# Patient Record
Sex: Female | Born: 1960
Health system: Southern US, Community
[De-identification: ages and names within clinical notes are randomized; demographics above are authoritative.]

## PROBLEM LIST (undated history)

## (undated) DIAGNOSIS — J439 Emphysema, unspecified: Secondary | ICD-10-CM

## (undated) DIAGNOSIS — F319 Bipolar disorder, unspecified: Secondary | ICD-10-CM

## (undated) DIAGNOSIS — J189 Pneumonia, unspecified organism: Secondary | ICD-10-CM

## (undated) HISTORY — PX: APPENDECTOMY: SHX54

## (undated) HISTORY — PX: FINGER AMPUTATION: SHX636

---

## 2011-07-08 ENCOUNTER — Emergency Department (HOSPITAL_COMMUNITY)
Admission: EM | Admit: 2011-07-08 | Discharge: 2011-07-09 | Disposition: A | Discharge status: Home / Self Care | Payer: Self-pay | Attending: Emergency Medicine | Admitting: Emergency Medicine

## 2011-07-08 ENCOUNTER — Encounter (HOSPITAL_COMMUNITY): Payer: Self-pay | Admitting: Emergency Medicine

## 2011-07-08 DIAGNOSIS — R110 Nausea: Secondary | ICD-10-CM | POA: Insufficient documentation

## 2011-07-08 DIAGNOSIS — F1910 Other psychoactive substance abuse, uncomplicated: Secondary | ICD-10-CM | POA: Insufficient documentation

## 2011-07-08 LAB — COMPREHENSIVE METABOLIC PANEL
Albumin: 4.5 g/dL (ref 3.5–5.2)
BUN: 12 mg/dL (ref 6–23)
Chloride: 102 mEq/L (ref 96–112)
Creatinine, Ser: 0.6 mg/dL (ref 0.50–1.10)
Total Bilirubin: 0.3 mg/dL (ref 0.3–1.2)

## 2011-07-08 LAB — ETHANOL: Alcohol, Ethyl (B): <11 mg/dL (ref 0–11)

## 2011-07-08 LAB — CBC
HCT: 43.5 % (ref 36.0–46.0)
MCHC: 35.2 g/dL (ref 30.0–36.0)
MCV: 94.2 fL (ref 78.0–100.0)
RDW: 13.2 % (ref 11.5–15.5)
WBC: 12.6 10*3/uL — ABNORMAL HIGH (ref 4.0–10.5)

## 2011-07-08 LAB — RAPID URINE DRUG SCREEN, HOSP PERFORMED
Amphetamines: NOT DETECTED
Opiates: NOT DETECTED

## 2011-07-08 MED ORDER — FOLIC ACID 1 MG PO TABS
1.0000 mg | ORAL_TABLET | Freq: Every day | ORAL | Status: DC
  Administered 2011-07-09: 1 mg via ORAL
  Filled 2011-07-08: qty 1

## 2011-07-08 MED ORDER — NICOTINE 21 MG/24HR TD PT24
21.0000 mg | MEDICATED_PATCH | Freq: Every day | TRANSDERMAL | Status: DC
  Administered 2011-07-09: 21 mg via TRANSDERMAL
  Filled 2011-07-08: qty 1

## 2011-07-08 MED ORDER — LORAZEPAM 1 MG PO TABS: 0.0000 mg | ORAL_TABLET | Freq: Two times a day (BID) | ORAL | Status: DC

## 2011-07-08 MED ORDER — LORAZEPAM 2 MG/ML IJ SOLN: 1.0000 mg | Freq: Four times a day (QID) | INTRAMUSCULAR | Status: DC | PRN

## 2011-07-08 MED ORDER — ZOLPIDEM TARTRATE 5 MG PO TABS: 5.0000 mg | ORAL_TABLET | Freq: Every evening | ORAL | Status: DC | PRN

## 2011-07-08 MED ORDER — ADULT MULTIVITAMIN W/MINERALS CH
1.0000 | ORAL_TABLET | Freq: Every day | ORAL | Status: DC
  Administered 2011-07-09: 1 via ORAL
  Filled 2011-07-08: qty 1

## 2011-07-08 MED ORDER — IBUPROFEN 200 MG PO TABS
600.0000 mg | ORAL_TABLET | Freq: Three times a day (TID) | ORAL | Status: DC | PRN
  Administered 2011-07-09: 600 mg via ORAL
  Filled 2011-07-08: qty 3

## 2011-07-08 MED ORDER — ONDANSETRON HCL 4 MG PO TABS
4.0000 mg | ORAL_TABLET | Freq: Three times a day (TID) | ORAL | Status: DC | PRN
  Administered 2011-07-09: 4 mg via ORAL
  Filled 2011-07-08: qty 1

## 2011-07-08 MED ORDER — THIAMINE HCL 100 MG/ML IJ SOLN: 100.0000 mg | Freq: Every day | INTRAMUSCULAR | Status: DC

## 2011-07-08 MED ORDER — LORAZEPAM 1 MG PO TABS
0.0000 mg | ORAL_TABLET | Freq: Four times a day (QID) | ORAL | Status: DC
  Administered 2011-07-09: 1 mg via ORAL

## 2011-07-08 MED ORDER — LORAZEPAM 1 MG PO TABS
1.0000 mg | ORAL_TABLET | Freq: Four times a day (QID) | ORAL | Status: DC | PRN
  Filled 2011-07-08: qty 1

## 2011-07-08 MED ORDER — VITAMIN B-1 100 MG PO TABS
100.0000 mg | ORAL_TABLET | Freq: Every day | ORAL | Status: DC
  Administered 2011-07-09: 100 mg via ORAL
  Filled 2011-07-08: qty 1

## 2011-07-08 MED ORDER — ALUM & MAG HYDROXIDE-SIMETH 200-200-20 MG/5ML PO SUSP: 30.0000 mL | ORAL | Status: DC | PRN

## 2011-07-08 MED ORDER — LORAZEPAM 1 MG PO TABS: 1.0000 mg | ORAL_TABLET | Freq: Three times a day (TID) | ORAL | Status: DC | PRN

## 2011-07-08 NOTE — ED Notes (Signed)
Pt alert, nad, c/o detox from "hydrocodone, and some alcohol", onset was today, pt states last use was yesterday, denies SI/HI, resp even unlabored, skin pwd

## 2011-07-08 NOTE — ED Notes (Signed)
ALP bedside to eval 

## 2011-07-09 LAB — URINALYSIS, ROUTINE W REFLEX MICROSCOPIC
Glucose, UA: NEGATIVE mg/dL
Hgb urine dipstick: NEGATIVE
Specific Gravity, Urine: 1.033 — ABNORMAL HIGH (ref 1.005–1.030)
pH: 6 (ref 5.0–8.0)

## 2011-07-09 NOTE — ED Provider Notes (Signed)
She is currently awaiting placement for detoxification from opiates. She is complaining of nausea. Zofran has been ordered.  Dione Booze, MD 07/09/11 [phone]

## 2011-07-09 NOTE — ED Notes (Signed)
Pt. given belongings per nurse's request.

## 2011-07-09 NOTE — Discharge Instructions (Signed)
Go directly to [facility] for detox.

## 2011-07-09 NOTE — ED Provider Notes (Signed)
The patient has been accepted at [facility] and will be discharged.  Dione Booze, MD 07/09/11 1048

## 2011-07-09 NOTE — ED Notes (Signed)
Report called to Jenn RN.

## 2011-07-09 NOTE — ED Provider Notes (Signed)
History     CSN: [id number]  Arrival date & time 07/08/11  2013   First MD Initiated Contact with Patient 07/08/11 2215      Chief Complaint  Patient presents with  . Medical Clearance    Detox from Hydrocodone    (Consider location/radiation/quality/duration/timing/severity/associated sxs/prior treatment) HPI Patient presents with a request for detox from hydrocodone. She states that she recently ran out of hydrocodone and would like detox do to "feeling bad". She describes mild nausea and diffuse body aches. She denies vomiting and states she has been able to drink liquids without difficulty. She denies any recent illnesses including no fevers no cough no nausea vomiting abdominal pain or chest pain. She specifically denies suicidal or homicidal ideations. She denies use of any other substances to me but did according to nursing note states that she occasionally drink alcohol. There are no alleviating or modifying factors. There no associated systemic symptoms.  Symptoms are continuous and worsening  History reviewed. No pertinent past medical history.  Past Surgical History  Procedure Date  . Appendectomy     No family history on file.  History  Substance Use Topics  . Smoking status: Current Everyday Smoker -- 1.0 packs/day    Types: Cigarettes  . Smokeless tobacco: Not on file  . Alcohol Use: Yes    OB History    Grav Para Term Preterm Abortions TAB SAB Ect Mult Living                  Review of Systems ROS reviewed and otherwise negative except for mentioned in HPI  Allergies  Review of patient's allergies indicates no known allergies.  Home Medications   Current Outpatient Rx  Name Route Sig Dispense Refill  . CETIRIZINE HCL 5 MG PO TABS Oral Take 5 mg by mouth daily.    Marland Kitchen DIPHENHYDRAMINE-APAP (SLEEP) 25-500 MG PO TABS Oral Take 2 tablets by mouth at bedtime as needed. For sleep.    Marland Kitchen HYDROCODONE-ACETAMINOPHEN 10-325 MG PO TABS Oral Take 2 tablets by mouth  every 4 (four) hours as needed. Pain.    Marland Kitchen MELATONIN 3 MG PO TABS Oral Take 2 tablets by mouth at bedtime.    . OXYCODONE-ACETAMINOPHEN 5-325 MG PO TABS Oral Take 2 tablets by mouth once. Pain.      BP 122/81  Pulse 104  Temp(Src) 98.4 F (36.9 C) (Oral)  Resp 18  Wt 148 lb (67.132 kg)  SpO2 98%  LMP 05/27/2011 Vitals reviewed Physical Exam Physical Examination: General appearance - alert, well appearing, and in no distress Mental status - alert, oriented to person, place, and time Eyes - pupils equal and reactive, no scleral icterus Mouth - mucous membranes moist, pharynx normal without lesions Chest - clear to auscultation, no wheezes, rales or rhonchi, symmetric air entry Heart - normal rate, regular rhythm, normal S1, S2, no murmurs, rubs, clicks or gallops Musculoskeletal - no joint tenderness, deformity or swelling Extremities - peripheral pulses normal, no pedal edema, no clubbing or cyanosis Skin - normal coloration and turgor, no rashes Psych- flat affect, cooperative, calm  ED Course  Procedures (including critical care time)  Labs Reviewed  CBC - Abnormal; Notable for the following:    WBC 12.6 (*)    Hemoglobin 15.3 (*)    All other components within normal limits  COMPREHENSIVE METABOLIC PANEL - Abnormal; Notable for the following:    Potassium 3.3 (*)    Total Protein 8.4 (*)    All other components  within normal limits  URINE RAPID DRUG SCREEN (HOSP PERFORMED) - Abnormal; Notable for the following:    Tetrahydrocannabinol POSITIVE (*)    All other components within normal limits  URINALYSIS, ROUTINE W REFLEX MICROSCOPIC - Abnormal; Notable for the following:    Color, Urine AMBER (*) BIOCHEMICALS MAY BE AFFECTED BY COLOR   Specific Gravity, Urine 1.033 (*)    Bilirubin Urine SMALL (*)    Ketones, ur >80 (*)    All other components within normal limits  ETHANOL   No results found.   1. Substance abuse       MDM  Patient presents with complaint  of narcotic abuse and requesting detox. She denies any suicidality or homicidality. She has been medically cleared and will be evaluated by the ACT counselor for possible detox.        Ethelda Chick, MD 07/10/11 [phone]

## 2011-07-09 NOTE — BH Assessment (Signed)
Assessment Note   Holly Cortez is an 51 y.o. female. Pt requests detox from opiates and reports daily use of 80-90mg  for past 7 years.  Pt also reports sporadic alcohol use.  Pt reports significant withdrawals, COWS 17.  Pt has no prior substance abuse treatment.  Pt also reports depression but denies SI/HI/AV.  Axis I: opioide dependence Axis II: Deferred Axis III: History reviewed. No pertinent past medical history. Axis IV: economic problems Axis V: 51-60 moderate symptoms  Past Medical History: History reviewed. No pertinent past medical history.  Past Surgical History  Procedure Date  . Appendectomy     Family History: No family history on file.  Social History:  reports that she has been smoking Cigarettes.  She has been smoking about 1 pack per day. She does not have any smokeless tobacco history on file. She reports that she drinks alcohol. She reports that she uses illicit drugs.  Additional Social History:  Alcohol / Drug Use Pain Medications: yes Prescriptions: yes Over the Counter: unk History of alcohol / drug use?: Yes Longest period of sobriety (when/how long): none recent Negative Consequences of Use: Financial Withdrawal Symptoms: Agitation;Diarrhea;Fever / Chills;Nausea / Vomiting;Tremors Substance #1 Name of Substance 1: alcohol, age 42, 1-2 drinks 1-2x per month.  Last drink one week ago. Substance #2 Name of Substance 2: opiates: norco, hydrocodone, lortab 2 - Age of First Use: 38 2 - Amount (size/oz): 80-90mg  2 - Frequency: daily 2 - Duration: 7 years 2 - Last Use / Amount: 2/12 10-20mg  Allergies: No Known Allergies  Home Medications:  Medications Prior to Admission  Medication Dose Route Frequency Provider Last Rate Last Dose  . alum & mag hydroxide-simeth (MAALOX/MYLANTA) 200-200-20 MG/5ML suspension 30 mL  30 mL Oral PRN Ethelda Chick, MD      . folic acid (FOLVITE) tablet 1 mg  1 mg Oral Daily Ethelda Chick, MD      . ibuprofen  (ADVIL,MOTRIN) tablet 600 mg  600 mg Oral Q8H PRN Ethelda Chick, MD      . LORazepam (ATIVAN) tablet 1 mg  1 mg Oral Q6H PRN Ethelda Chick, MD       Or  . LORazepam (ATIVAN) injection 1 mg  1 mg Intravenous Q6H PRN Ethelda Chick, MD      . LORazepam (ATIVAN) tablet 0-4 mg  0-4 mg Oral Q6H Ethelda Chick, MD       Followed by  . LORazepam (ATIVAN) tablet 0-4 mg  0-4 mg Oral Q12H Ethelda Chick, MD      . LORazepam (ATIVAN) tablet 1 mg  1 mg Oral Q8H PRN Ethelda Chick, MD      . mulitivitamin with minerals tablet 1 tablet  1 tablet Oral Daily Ethelda Chick, MD      . nicotine (NICODERM CQ - dosed in mg/24 hours) patch 21 mg  21 mg Transdermal Daily Ethelda Chick, MD      . ondansetron [facility]) tablet 4 mg  4 mg Oral Q8H PRN Ethelda Chick, MD      . thiamine (VITAMIN B-1) tablet 100 mg  100 mg Oral Daily Ethelda Chick, MD       Or  . thiamine (B-1) injection 100 mg  100 mg Intravenous Daily Ethelda Chick, MD      . zolpidem (AMBIEN) tablet 5 mg  5 mg Oral QHS PRN Ethelda Chick, MD       No current outpatient  prescriptions on file as of 07/08/2011.    OB/GYN Status:  Patient's last menstrual period was 05/27/2011.  General Assessment Data Location of Assessment: [facility] ED ACT Assessment: Yes Living Arrangements: Parent;Relatives (sister) Can pt return to current living arrangement?: Yes     Risk to self Suicidal Ideation: No Suicidal Intent: No Is patient at risk for suicide?: No Suicidal Plan?: No Access to Means: No What has been your use of drugs/alcohol within the last 12 months?: current user Previous Attempts/Gestures: No Intentional Self Injurious Behavior: None Family Suicide History: No Recent stressful life event(s): Job Loss;Financial Problems Persecutory voices/beliefs?: No Depression: Yes Depression Symptoms: Despondent;Insomnia;Tearfulness;Isolating;Fatigue;Guilt;Loss of interest in usual pleasures;Feeling worthless/self pity Substance abuse  history and/or treatment for substance abuse?: Yes Suicide prevention information given to non-admitted patients: Not applicable  Risk to Others Homicidal Ideation: No Thoughts of Harm to Others: No Current Homicidal Intent: No Current Homicidal Plan: No Access to Homicidal Means: No History of harm to others?: No Assessment of Violence: None Noted Does patient have access to weapons?: No Criminal Charges Pending?: No Does patient have a court date: No  Psychosis Hallucinations: None noted Delusions: None noted  Mental Status Report Appear/Hygiene: Other (Comment) (casual) Eye Contact: Good Motor Activity: Restlessness Speech: Logical/coherent Level of Consciousness: Alert Mood: Depressed;Anxious Affect: Appropriate to circumstance Anxiety Level: Moderate Thought Processes: Coherent;Relevant Judgement: Unimpaired Orientation: Person;Place;Time;Situation Obsessive Compulsive Thoughts/Behaviors: None  Cognitive Functioning Concentration: Normal Memory: Recent Intact;Remote Intact IQ: Average Insight: Good Impulse Control: Poor Appetite: Poor Weight Loss: 0  Weight Gain: 0  Sleep: Decreased Total Hours of Sleep: 2  Vegetative Symptoms: None  Prior Inpatient Therapy Prior Inpatient Therapy: No  Prior Outpatient Therapy Prior Outpatient Therapy: Yes Prior Therapy Dates: 56s Prior Therapy Facilty/Provider(s): [facility] Reason for Treatment: medication/psych  ADL Screening (condition at time of admission) Patient's cognitive ability adequate to safely complete daily activities?: Yes Independently performs ADLs?: Yes  Home Assistive Devices/Equipment Home Assistive Devices/Equipment: None    Abuse/Neglect Assessment (Assessment to be complete while patient is alone) Physical Abuse: Denies Verbal Abuse: Denies Sexual Abuse: Denies Values / Beliefs Cultural Requests During Hospitalization: None Spiritual Requests During Hospitalization: None     Advance Directives (For Healthcare) Advance Directive: Patient does not have advance directive;Patient would not like information    Additional Information 1:1 In Past 12 Months?: No CIRT Risk: No Elopement Risk: No Does patient have medical clearance?: Yes     Disposition: Pt referred to [facility] for detox.    On Site Evaluation by:   Reviewed with Physician:     Lorri Frederick 07/09/2011 12:33 AM

## 2011-07-09 NOTE — ED Notes (Signed)
Pt is c/o pain in back and arms 7/10.  Feeling very tense.  States she hasn't slept in several days.  Has not drank ETOH in a week.  Last used pain pills yesterday.  All vitals WNL.  NAD.

## 2011-07-09 NOTE — Discharge Planning (Signed)
Patient has been accepted to [facility] per [facility] at [facility].  [facility] will come to pick patient up. EDP and patient's nurse notified.  Ileene Hutchinson , MSW, LCSWA 07/09/2011  10:44 AM [phone]

## 2012-10-14 ENCOUNTER — Ambulatory Visit: Payer: No Typology Code available for payment source | Attending: Family Medicine | Admitting: Family Medicine

## 2012-10-14 ENCOUNTER — Ambulatory Visit (HOSPITAL_COMMUNITY)
Admission: RE | Admit: 2012-10-14 | Discharge: 2012-10-14 | Disposition: A | Discharge status: Home / Self Care | Payer: No Typology Code available for payment source | Source: Ambulatory Visit | Attending: Family Medicine | Admitting: Family Medicine

## 2012-10-14 VITALS — BP 110/73 | HR 77 | Temp 98.2°F | Resp 16 | Wt 141.2 lb

## 2012-10-14 DIAGNOSIS — R609 Edema, unspecified: Secondary | ICD-10-CM

## 2012-10-14 DIAGNOSIS — Z79899 Other long term (current) drug therapy: Secondary | ICD-10-CM

## 2012-10-14 DIAGNOSIS — F319 Bipolar disorder, unspecified: Secondary | ICD-10-CM | POA: Insufficient documentation

## 2012-10-14 DIAGNOSIS — M79609 Pain in unspecified limb: Secondary | ICD-10-CM

## 2012-10-14 DIAGNOSIS — M7989 Other specified soft tissue disorders: Secondary | ICD-10-CM | POA: Insufficient documentation

## 2012-10-14 DIAGNOSIS — R600 Localized edema: Secondary | ICD-10-CM

## 2012-10-14 DIAGNOSIS — F17200 Nicotine dependence, unspecified, uncomplicated: Secondary | ICD-10-CM | POA: Insufficient documentation

## 2012-10-14 DIAGNOSIS — M6740 Ganglion, unspecified site: Secondary | ICD-10-CM | POA: Insufficient documentation

## 2012-10-14 DIAGNOSIS — Z8659 Personal history of other mental and behavioral disorders: Secondary | ICD-10-CM | POA: Insufficient documentation

## 2012-10-14 DIAGNOSIS — M79604 Pain in right leg: Secondary | ICD-10-CM

## 2012-10-14 DIAGNOSIS — R631 Polydipsia: Secondary | ICD-10-CM

## 2012-10-14 LAB — COMPREHENSIVE METABOLIC PANEL
ALT: 10 U/L (ref 0–35)
CO2: 26 mEq/L (ref 19–32)
Calcium: 9.4 mg/dL (ref 8.4–10.5)
Chloride: 107 mEq/L (ref 96–112)
Creat: 0.57 mg/dL (ref 0.50–1.10)
Sodium: 138 mEq/L (ref 135–145)
Total Protein: 7.3 g/dL (ref 6.0–8.3)

## 2012-10-14 NOTE — Patient Instructions (Addendum)
Go to have your ultrasound on the right leg to rule out blood clot.  Manic Depression (Bipolar Disorder) Bipolar disorder is also known as manic depressive illness. It is when the brain does not function properly and causes shifts in a person's moods, energy and ability to function in everyday life. These shifts are different from the normal ups and downs that everyone experiences. Instead the shifts are severe. If this goes untreated, the person's life becomes more and more disorderly. People with this disorder can be treated can lead full and productive lives. This disorder must be managed throughout life.  SYMPTOMS   Bipolar disorder causes dramatic mood swings. These mood swings go in cycles. They cycle from extreme "highs" and irritable to deep "lows" of sadness and hopelessness.  Between the extreme moods, there are usually periods of normal mood.  Along with the mood shifts, the person will have severe changes in energy and behavior. The periods of "highs" and "lows" are called episodes of mania and depression. Signs of mania:  Lots of energy, activity and restlessness.  Extreme "high" or good mood.  Extreme irritability.  Racing thoughts and talking very fast.  Jumping from one idea to another.  Not able to focus, easily distracted.  Little need to sleep.  Grand beliefs in one's abilities and powers.  Spending sprees.  Increased sexual drive. This can result in many sexual partners.  Poor judgment.  Abuse of drugs, particularly cocaine, alcohol, and sleeping medication.  Aggressive or provocative behavior.  A lasting period of behavior that is different from usual.  Denial that anything is wrong. *A manic episode is identified if a "high" mood happens with three or more of the other symptoms lasting most of the day, nearly everyday for a week or longer. If the mood is more irritable in nature, four additional symptoms must be present. Signs of depression:  Lasting  feelings of sadness, anxiety, or empty mood.  Feelings of hopelessness with negative thoughts.  Feelings of guilt, worthlessness, or helplessness.  Loss of interest or pleasure in activities once enjoyed, including sex.  Feelings of fatigue or having less energy.  Trouble focusing, making decisions, remembering.  Feeling restless or irritable.  Sleeping too little or too much.  Change in eating with possible weight gain or loss.  Feeling ongoing pain that is not caused by physical illness or injury.  Thoughts of death or suicide or suicide attempts. *A depressive episode is identified as having five or more of the above symptoms that last most of the day, nearly everyday for two weeks or longer. CAUSES   Research shows that there is no single cause for the disorder. Many factors act together to produce the illness.  This can be passed down from family (hereditary).  Environment may play a part. TREATMENT   Long-term treatment is strongly recommended because bipolar disorder is a repeated illness. This disorder is better controlled if treatment is ongoing than if it is off and on.  A combination of medication and talk therapy is best for managing the disorder over time.  Medication.  Medication can be prescribed by a doctor that is an expert in treating mental disorders (psychiatrists). Medications known as "mood stabilizers" are usually prescribed to help control the illness. Other medications can be added when needed. These medicines usually treat episodes of mania or depression that break through despite the mood stabilizer.  Talk Therapy.  Along with medication, some forms of talk therapy are helpful in providing support, education  and guidance to people with the illness and their families. Studies show that this type of treatment increases mood stability, decreases need for hospitalization and improves how they function society.  Electroconvulsive Therapy (ECT).  In  extreme situations where the above treatments do not work or work too slowly to relieve severe symptoms, ECT may be considered. Document Released: 08/17/2000 Document Revised: 08/03/2011 Document Reviewed: 04/08/2007 [facility] Patient Information 2014 [place], [place].

## 2012-10-14 NOTE — Progress Notes (Signed)
Right lower extremity venous duplex completed.  Right:  No evidence of DVT, superficial thrombosis, or Baker's cyst.  Left:  Negative for DVT in the common femoral vein.  

## 2012-10-14 NOTE — Progress Notes (Signed)
Patient ID: Holly Cortez, female   DOB: 12/26/1960, 52 y.o.   MRN: [id number]  CC: establish  HPI: Pt reports that she would like her blood sugar testede.  Pt reports a cyst on her right wrist that has been getting bigger and causing more pain.  Pt reports that she is having some pain in the bottom of the feet.   No Known Allergies History reviewed. No pertinent past medical history. Current Outpatient Prescriptions on File Prior to Visit  Medication Sig Dispense Refill  . cetirizine (ZYRTEC) 5 MG tablet Take 5 mg by mouth daily.      . diphenhydramine-acetaminophen (TYLENOL PM) 25-500 MG TABS Take 2 tablets by mouth at bedtime as needed. For sleep.      Marland Kitchen HYDROcodone-acetaminophen (NORCO) 10-325 MG per tablet Take 2 tablets by mouth every 4 (four) hours as needed. Pain.      . Melatonin 3 MG TABS Take 2 tablets by mouth at bedtime.      Marland Kitchen oxyCODONE-acetaminophen (PERCOCET) 5-325 MG per tablet Take 2 tablets by mouth once. Pain.       No current facility-administered medications on file prior to visit.   History reviewed. No pertinent family history. History   Social History  . Marital Status: Unknown    Spouse Name: N/A    Number of Children: N/A  . Years of Education: N/A   Occupational History  . Not on file.   Social History Main Topics  . Smoking status: Current Every Day Smoker -- 1.00 packs/day    Types: Cigarettes  . Smokeless tobacco: Not on file  . Alcohol Use: Yes  . Drug Use: Yes  . Sexually Active: Not on file   Other Topics Concern  . Not on file   Social History Narrative  . No narrative on file    Review of Systems  Constitutional: Negative for fever, chills, diaphoresis, activity change, appetite change and fatigue.  HENT: Negative for ear pain, nosebleeds, congestion, facial swelling, rhinorrhea, neck pain, neck stiffness and ear discharge.   Eyes: Negative for pain, discharge, redness, itching and visual disturbance.  Respiratory: Negative for  cough, choking, chest tightness, shortness of breath, wheezing and stridor.   Cardiovascular: Negative for chest pain, palpitations and leg swelling.  Gastrointestinal: Negative for abdominal distention.  Genitourinary: Negative for dysuria, urgency, frequency, hematuria, flank pain, decreased urine volume, difficulty urinating and dyspareunia.  Musculoskeletal: Large ganglion cyst on the right wrist  Neurological: Negative for dizziness, tremors, seizures, syncope, facial asymmetry, speech difficulty, weakness, light-headedness, numbness and headaches.  Hematological: Negative for adenopathy. Does not bruise/bleed easily.  Psychiatric/Behavioral: Negative for hallucinations, behavioral problems, confusion, dysphoric mood, decreased concentration and agitation.    Objective:   Filed Vitals:   10/14/12 1750  BP: 110/73  Pulse: 77  Temp: 98.2 F (36.8 C)  Resp: 16    Physical Exam  Constitutional: Appears well-developed and well-nourished. No distress.  HENT: Normocephalic. External right and left ear normal. Oropharynx is clear and moist.  Eyes: Conjunctivae and EOM are normal. PERRLA, no scleral icterus.  Neck: Normal ROM. Neck supple. No JVD. No tracheal deviation. No thyromegaly.  CVS: RRR, S1/S2 +, no murmurs, no gallops, no carotid bruit.  Pulmonary: Effort and breath sounds normal, no stridor, rhonchi, wheezes, rales.  Abdominal: Soft. BS +,  no distension, tenderness, rebound or guarding.  Musculoskeletal: Normal range of motion. No edema and no tenderness.  Lymphadenopathy: No lymphadenopathy noted, cervical, inguinal. Neuro: Alert. Normal reflexes, muscle tone coordination. No cranial  nerve deficit. Skin: Skin is warm and dry. No rash noted. Not diaphoretic. No erythema. No pallor.  Psychiatric: Normal mood and affect. Behavior, judgment, thought content normal.   Lab Results  Component Value Date   WBC 12.6* 07/08/2011   HGB 15.3* 07/08/2011   HCT 43.5 07/08/2011   MCV  94.2 07/08/2011   PLT 324 07/08/2011   Lab Results  Component Value Date   CREATININE 0.60 07/08/2011   BUN 12 07/08/2011   NA 139 07/08/2011   K 3.3* 07/08/2011   CL 102 07/08/2011   CO2 23 07/08/2011    No results found for this basename: HGBA1C   Lipid Panel  No results found for this basename: chol, trig, hdl, cholhdl, vldl, ldlcalc     Assessment and plan:   Ganglion Cyst right wrist, symptomatic Manic Depression  Chronic pain  Tobacco use - The patient was counseled on the dangers of tobacco use, and was advised to quit and referred to a tobacco cessation program.  Reviewed strategies to maximize success, including removing cigarettes and smoking materials from environment and stress management. Check some labs today to rule out DM per pt request.  Swollen right lower leg and ankle Check doppler ultrasound of right leg to rule out DVT  I also explained to the patient that she needs to see a pain management specialist so that she can be evaluated for chronic pain conditions.  I told her that this clinic was not equipped to handle chronic pain management and so we have decided as a group not to manage chronic pain until we have appropriate staff and support to manage these patients.  The patient was given clear instructions to go to ER or return to medical center if symptoms don't improve, worsen or new problems develop.  The patient verbalized understanding.  The patient was told to call to get lab results if they haven't heard anything in the next week.    Follow up scheduled  Rodney Langton, MD, CDE, FAAFP [facility] [place], [place]

## 2012-10-14 NOTE — Progress Notes (Signed)
Patient states has a history of manic depression Here to establish care

## 2012-10-15 LAB — HEMOGLOBIN A1C: Hgb A1c MFr Bld: 5.1 % (ref <5.7)

## 2012-10-15 NOTE — Progress Notes (Signed)
Quick Note:  Please inform patient that her labs came back normal.   Rodney Langton, MD, CDE, FAAFP [facility] [place], [place]   ______

## 2012-10-24 ENCOUNTER — Telehealth: Payer: Self-pay | Admitting: *Deleted

## 2012-10-24 NOTE — Progress Notes (Signed)
10/24/12 Patient informed that lab are WNLper Dr.Johnson P.Wiliams,RN

## 2012-10-26 ENCOUNTER — Ambulatory Visit: Payer: No Typology Code available for payment source | Attending: Family Medicine | Admitting: Family Medicine

## 2012-10-26 VITALS — BP 119/78 | HR 82 | Temp 97.3°F | Resp 14 | Ht 65.0 in | Wt 143.0 lb

## 2012-10-26 DIAGNOSIS — K029 Dental caries, unspecified: Secondary | ICD-10-CM

## 2012-10-26 MED ORDER — AMOXICILLIN 875 MG PO TABS: 875.0000 mg | ORAL_TABLET | Freq: Two times a day (BID) | ORAL | Status: DC

## 2012-10-26 NOTE — Progress Notes (Signed)
Subjective:     Patient ID: Holly Cortez, female   DOB: 1960-07-23, 52 y.o.   MRN: [id number]  HPI Pt here having just acquired hr orange card, requesting dental referral. She has pain in a few teeth and gum areas on the front area of her lower jaw due to caries. She suspects she will need a root canal.    Review of Systems per hpi     Objective:   Physical Exam  Constitutional: She appears well-developed and well-nourished.  HENT:  Gums inflamed and poor dentition, no obvious abscess.   Cardiovascular: Normal rate and regular rhythm.   Pulmonary/Chest: Effort normal and breath sounds normal.       Assessment:     Pain due to dental caries - Plan: amoxicillin (AMOXIL) 875 MG tablet       Plan:     Showed pt where number is to call for dental services on her orange card.  Amox.  F/u as scheduled.

## 2012-10-26 NOTE — Progress Notes (Signed)
Patient states she needs referral for dentist due to tooth pain that she states is on her left and right sides of upper and lower jaw.

## 2012-10-26 NOTE — Patient Instructions (Addendum)
Dental Caries   Tooth decay (dental caries, cavities) is the most common of all oral diseases. It occurs in all ages but is more common in children and young adults.   CAUSES   Bacteria in your mouth combine with foods (particularly sugars and starches) to produce plaque. Plaque is a substance that sticks to the hard surfaces of teeth. The bacteria in the plaque produce acids that attack the enamel of teeth. Repeated acid attacks dissolve the enamel and create holes in the teeth. Root surfaces of teeth may also get these holes.   Other contributing factors include:    Frequent snacking and drinking of cavity-producing foods and liquids.   Poor oral hygiene.   Dry mouth.   Substance abuse such as methamphetamine.   Broken or poor fitting dental restorations.   Eating disorders.   Gastroesophageal reflux disease (GERD).   Certain radiation treatments to the head and neck.  SYMPTOMS   At first, dental decay appears as white, chalky areas on the enamel. In this early stage, symptoms are seldom present. As the decay progresses, pits and holes may appear on the enamel surfaces. Progression of the decay will lead to softening of the hard layers of the tooth. At this point you may experience some pain or achy feeling after sweet, hot, or cold foods or drinks are consumed. If left untreated, the decay will reach the internal structures of the tooth and produce severe pain. Extensive dental treatment, such as root canal therapy, may be needed to save the tooth at this late stage of decay development.   DIAGNOSIS   Most cavities will be detected during regular check-ups. A thorough medical and dental history will be taken by the dentist. The dentist will use instruments to check the surfaces of your teeth for any breakdown or discoloration. Some dentists have special instruments, such as lasers, that detect tooth decay. Dental X-rays may also show some cavities that are not visible to the eye (such as between the  contact areas of the teeth).  TREATMENT   Treatment involves removal of the tooth decay and replacement with a restorative material such as silver, gold, or composite (white) material. However, if the decay involves a large area of the tooth and there is little remaining healthy tooth structure, a cap (crown) will be fitted over the remaining structure. If the decay involves the center part of the tooth (pulp), root canal treatment will be needed before any type of dental restoration is placed. If the tooth is severely destroyed by the decay process, leaving the remaining tooth structures unrestorable, the tooth will need to be pulled (extracted). Some early tooth decay may be reversed by fluoride treatments and thorough brushing and flossing at home.  PREVENTION    Eat healthy foods. Restrict the amount of sugary, starchy foods and liquids you consume. Avoid frequent snacking and drinking of unhealthy foods and liquids.   Sealants can help with prevention of cavities. Sealants are composite resins applied onto the biting surfaces of teeth at risk for decay. They smooth out the pits and grooves and prevent food from being trapped in them. This is done in early childhood before tooth decay has started.   Fluoride tablets may also be prescribed to children between 6 months and 10 years of age if your drinking water is not fluoridated. The fluoride absorbed by the tooth enamel makes teeth less susceptible to decay. Thorough daily cleaning with a toothbrush and dental floss is the best way to prevent   cavities. Use of a fluoride toothpaste is highly recommended. Fluoride mouth rinses may be used in specific cases.   Topical application of fluoride by your dentist is important in children.   Regular visits with a dentist for checkups and cleanings are also important.  SEEK IMMEDIATE DENTAL CARE IF:   You have a fever.   You develop redness and swelling of your face, jaw, or neck.   You develop swelling around a  tooth.   You are unable to open your mouth or cannot swallow.   You have severe pain uncontrolled by pain medicine.  Document Released: 01/31/2002 Document Revised: 08/03/2011 Document Reviewed: 10/16/2010  [employer] Patient Information 2014 [employer].

## 2012-11-02 ENCOUNTER — Other Ambulatory Visit: Payer: Self-pay | Admitting: Vascular Surgery

## 2012-11-02 DIAGNOSIS — Z79899 Other long term (current) drug therapy: Secondary | ICD-10-CM

## 2012-11-02 DIAGNOSIS — R631 Polydipsia: Secondary | ICD-10-CM

## 2012-11-02 DIAGNOSIS — M79604 Pain in right leg: Secondary | ICD-10-CM

## 2012-11-02 DIAGNOSIS — R600 Localized edema: Secondary | ICD-10-CM

## 2012-11-24 ENCOUNTER — Ambulatory Visit: Payer: No Typology Code available for payment source

## 2012-11-28 ENCOUNTER — Ambulatory Visit: Payer: No Typology Code available for payment source | Attending: Family Medicine | Admitting: Internal Medicine

## 2012-11-28 VITALS — BP 135/78 | HR 84 | Temp 98.3°F | Resp 16 | Wt 141.4 lb

## 2012-11-28 DIAGNOSIS — M25461 Effusion, right knee: Secondary | ICD-10-CM

## 2012-11-28 DIAGNOSIS — F319 Bipolar disorder, unspecified: Secondary | ICD-10-CM

## 2012-11-28 DIAGNOSIS — R21 Rash and other nonspecific skin eruption: Secondary | ICD-10-CM

## 2012-11-28 DIAGNOSIS — M129 Arthropathy, unspecified: Secondary | ICD-10-CM

## 2012-11-28 DIAGNOSIS — M25469 Effusion, unspecified knee: Secondary | ICD-10-CM | POA: Insufficient documentation

## 2012-11-28 DIAGNOSIS — M1990 Unspecified osteoarthritis, unspecified site: Secondary | ICD-10-CM | POA: Insufficient documentation

## 2012-11-28 NOTE — Patient Instructions (Signed)
Arthritis, Nonspecific  Arthritis is pain, redness, warmth, or puffiness (inflammation) of a joint. The joint may be stiff or hurt when you move it. One or more joints may be affected. There are many types of arthritis. Your doctor may not know what type you have right away. The most common cause of arthritis is wear and tear on the joint (osteoarthritis).  HOME CARE   · Only take medicine as told by your doctor.  · Rest the joint as much as possible.  · Raise (elevate) your joint if it is puffy.  · Use crutches if the painful joint is in your leg.  · Drink enough fluids to keep your pee (urine) clear or pale yellow.  · Follow your doctor's diet instructions.  · Use cold packs for very bad joint pain for 10 to 15 minutes every hour. Ask your doctor if it is okay for you to use hot packs.  · Exercise as told by your doctor.  · Take a warm shower if you have stiffness in the morning.  · Move your sore joints throughout the day.  GET HELP RIGHT AWAY IF:   · You have a fever.  · You have very bad joint pain, puffiness, or redness.  · You have many joints that are painful and puffy.  · You are not getting better with treatment.  · You have very bad back pain or leg weakness.  · You cannot control when you poop (bowel movement) or pee (urinate).  · You do not feel better in 24 hours or are getting worse.  · You are having side effects from your medicine.  MAKE SURE YOU:   · Understand these instructions.  · Will watch your condition.  · Will get help right away if you are not doing well or get worse.  Document Released: 08/05/2009 Document Revised: 11/10/2011 Document Reviewed: 08/05/2009  [employer]® Patient Information ©2014 [employer].

## 2012-11-28 NOTE — Progress Notes (Signed)
Patient ID: Holly Cortez, female   DOB: [birth year], 52 y.o.   MRN: [id number]  CC:  HPI: This is a 52 year old female who presents with swelling of her right knee which has been ongoing for about a month now. It had spread down to her calf and she was seen in this clinic for this. An ultrasound/duplex of her right leg was ordered to look for DVT. This was negative. In the interim the patient underwent a course of amoxicillin for teeth issues and the swelling in her leg went down. Hours she continues to have swelling and pain in her right knee and feels like there is bone rubbing on bone. In addition she has similar pains in her hands and shoulders. She states the only thing which alleviates his pain is aspirin which she has been taking 3 times a day. She refuses to take any other medication for this.  In addition she presents with a rash on her hands and on her feet which is on and off for many months. She states the amoxicillin helped this rash as well. But it has recurred. She notes that she has taken multiple over-the-counter medications including hydrocortisone which has not helped the rash resolved. No Known Allergies History reviewed. No pertinent past medical history. Current Outpatient Prescriptions on File Prior to Visit  Medication Sig Dispense Refill  . ClonazePAM (KLONOPIN PO) Take 5 mg by mouth 2 (two) times daily.       . Divalproex Sodium (DEPAKOTE PO) Take by mouth.      . Melatonin 3 MG TABS Take 2 tablets by mouth at bedtime.      Marland Kitchen PARoxetine (PAXIL) 10 MG tablet Take 10 mg by mouth every morning.      . zolpidem (AMBIEN) 10 MG tablet Take 10 mg by mouth at bedtime as needed for sleep.       No current facility-administered medications on file prior to visit.   Family History  Problem Relation Age of Onset  . Heart disease Mother   . Hyperlipidemia Father    History   Social History  . Marital Status: Unknown    Spouse Name: N/A    Number of Children: N/A  . Years  of Education: N/A   Occupational History  . Not on file.   Social History Main Topics  . Smoking status: Current Every Day Smoker -- 1.00 packs/day    Types: Cigarettes  . Smokeless tobacco: Not on file  . Alcohol Use: Yes  . Drug Use: Yes  . Sexually Active: Not on file   Other Topics Concern  . Not on file   Social History Narrative  . No narrative on file    Review of Systems ______ Constitutional: Negative for fever, chills, diaphoresis, activity change, appetite change and fatigue. ____ HENT: Negative for ear pain, nosebleeds, congestion, facial swelling, rhinorrhea, neck pain, neck stiffness and ear discharge.  ____ Eyes: Negative for pain, discharge, redness, itching and visual disturbance. ____ Respiratory: Negative for cough, choking, chest tightness, shortness of breath, wheezing and stridor.  ____ Cardiovascular: Negative for chest pain, palpitations and leg swelling. ____ Gastrointestinal: Negative for Nausea/ Vomiting/ Diarrhea or Consitpation Genitourinary: Negative for dysuria, urgency, frequency, hematuria, flank pain, decreased urine volume, difficulty urinating and dyspareunia. ____ Musculoskeletal: Negative for back pain, joint swelling, arthralgias and gait problem. ________ Neurological: Negative for dizziness, tremors, seizures, syncope, facial asymmetry, speech difficulty, weakness, light-headedness, numbness and headaches. ____ Hematological: Negative for adenopathy. Does not bruise/bleed easily. ____ Psychiatric/Behavioral: Negative  for hallucinations, behavioral problems, confusion, dysphoric mood, decreased concentration and agitation. ______   Objective:   Filed Vitals:   11/28/12 1341  BP: 135/78  Pulse: 84  Temp: 98.3 F (36.8 C)  Resp: 16    Physical Exam ______ Constitutional: Appears well-developed and well-nourished. No distress. ____ HENT: Normocephalic. External right and left ear normal. Oropharynx is clear and moist. ____ Eyes:  Conjunctivae and EOM are normal. PERRLA, no scleral icterus. ____ Neck: Normal ROM. Neck supple. No JVD. No tracheal deviation. No thyromegaly. ____ CVS: RRR, S1/S2 +, no murmurs, no gallops, no carotid bruit.  Pulmonary: Effort and breath sounds normal, no stridor, rhonchi, wheezes, rales.  Abdominal: Soft. BS +,  no distension, tenderness, rebound or guarding. ________ Musculoskeletal: Normal range of motion. No edema and no tenderness. ____ Lymphadenopathy: No lymphadenopathy noted, cervical, inguinal. Neuro: Alert. Normal reflexes, muscle tone coordination. No cranial nerve deficit. Skin: Skin is warm and dry. No rash noted. Not diaphoretic. No erythema. No pallor. ____ Psychiatric: Normal mood and affect. Behavior, judgment, thought content normal. __  Lab Results  Component Value Date   WBC 12.6* 07/08/2011   HGB 15.3* 07/08/2011   HCT 43.5 07/08/2011   MCV 94.2 07/08/2011   PLT 324 07/08/2011   Lab Results  Component Value Date   CREATININE 0.57 10/14/2012   BUN 12 10/14/2012   NA 138 10/14/2012   K 4.7 10/14/2012   CL 107 10/14/2012   CO2 26 10/14/2012    Lab Results  Component Value Date   HGBA1C 5.1 10/14/2012   Lipid Panel  No results found for this basename: chol, trig, hdl, cholhdl, vldl, ldlcalc       Assessment and plan:   Patient Active Problem List   Diagnosis Date Noted  . Leg edema, right 10/14/2012  . Manic depression 10/14/2012   #1. Right knee swelling-most likely arthritis-have ordered an x-ray of her right knee and referred her to orthopedics services at this point she wishes to continue taking aspirin which is the most effective she states for her. #2. Rash on hands: Unable to determine what this is and will refer her to dermatology for this #3 manic depression: Follows up with [facility] for this #4 health maintenance: Mammogram:ordered Pap Smear :will come back in 2-4 wks to have this performed

## 2012-11-28 NOTE — Progress Notes (Signed)
Patient here for follow up of right knee Complains of pain in and around knee Continues to swell from knee all the way to her foot

## 2012-12-19 ENCOUNTER — Ambulatory Visit
Admission: RE | Admit: 2012-12-19 | Discharge: 2012-12-19 | Disposition: A | Discharge status: Home / Self Care | Payer: No Typology Code available for payment source | Source: Ambulatory Visit | Attending: Family Medicine | Admitting: Family Medicine

## 2012-12-19 ENCOUNTER — Ambulatory Visit (INDEPENDENT_AMBULATORY_CARE_PROVIDER_SITE_OTHER): Payer: No Typology Code available for payment source | Admitting: Family Medicine

## 2012-12-19 ENCOUNTER — Other Ambulatory Visit: Payer: Self-pay | Admitting: Family Medicine

## 2012-12-19 VITALS — BP 110/75 | Ht 64.0 in | Wt 138.0 lb

## 2012-12-19 DIAGNOSIS — M25561 Pain in right knee: Secondary | ICD-10-CM

## 2012-12-19 DIAGNOSIS — M25461 Effusion, right knee: Secondary | ICD-10-CM

## 2012-12-19 DIAGNOSIS — R600 Localized edema: Secondary | ICD-10-CM

## 2012-12-19 DIAGNOSIS — M25469 Effusion, unspecified knee: Secondary | ICD-10-CM

## 2012-12-19 DIAGNOSIS — R609 Edema, unspecified: Secondary | ICD-10-CM

## 2012-12-19 DIAGNOSIS — M25569 Pain in unspecified knee: Secondary | ICD-10-CM

## 2012-12-20 ENCOUNTER — Encounter: Payer: Self-pay | Admitting: Family Medicine

## 2012-12-20 DIAGNOSIS — M25561 Pain in right knee: Secondary | ICD-10-CM | POA: Insufficient documentation

## 2012-12-20 NOTE — Progress Notes (Signed)
  Subjective:    Patient ID: Holly Cortez, female    DOB: 02-13-1961, 52 y.o.   MRN: [id number]  HPI  Right knee pain. Reports she has severe osteoarthritis and has had it for years. Has not had any recent x-rays. Right knee pain also for years, worse over the last few weeks. She also reports right lower leg swelling that is intermittent. Says it swells up like a big grapefruit. The left leg does not swell. She recently had lower 70 ultrasound done by her PCP to rule out DVT and it was reportedly negative.  Pain is in the center of the knee, aching, 9/10 at times. Worse with stair climbing. She would like a corticosteroid injection. She's never had a corticosteroid injection in the knee before.  PERTINENT  PMH / PSH: No history of specific right knee injury, no history of knee surgery.  Review of Systems No fever, sweats, chills, unusual weight change.    Objective:   Physical Exam  Vital signs are reviewed GENERAL: Well-developed female no acute distress KNEES: Bilaterally symmetrical. The right knee is without effusion, no erythema, no warmth. Mild crepitus on extension. Extension flexion range of motion is full. Ligamentously intact to varus and valgus stress. Negative McMurray. The calf is soft. Distally neurovascularly intact.      Assessment & Plan:

## 2012-12-26 ENCOUNTER — Ambulatory Visit (INDEPENDENT_AMBULATORY_CARE_PROVIDER_SITE_OTHER): Payer: No Typology Code available for payment source | Admitting: Family Medicine

## 2012-12-26 ENCOUNTER — Encounter: Payer: Self-pay | Admitting: Family Medicine

## 2012-12-26 VITALS — BP 125/77 | HR 86 | Ht 64.0 in | Wt 138.0 lb

## 2012-12-26 DIAGNOSIS — R609 Edema, unspecified: Secondary | ICD-10-CM

## 2012-12-26 DIAGNOSIS — L259 Unspecified contact dermatitis, unspecified cause: Secondary | ICD-10-CM

## 2012-12-26 DIAGNOSIS — M25561 Pain in right knee: Secondary | ICD-10-CM

## 2012-12-26 DIAGNOSIS — R600 Localized edema: Secondary | ICD-10-CM

## 2012-12-26 DIAGNOSIS — M25569 Pain in unspecified knee: Secondary | ICD-10-CM

## 2012-12-26 DIAGNOSIS — L309 Dermatitis, unspecified: Secondary | ICD-10-CM

## 2012-12-26 MED ORDER — METHYLPREDNISOLONE ACETATE 40 MG/ML IJ SUSP
40.0000 mg | Freq: Once | INTRAMUSCULAR | Status: AC
  Administered 2012-12-26: 40 mg via INTRA_ARTICULAR

## 2012-12-26 MED ORDER — TRIAMCINOLONE ACETONIDE 0.025 % EX OINT: TOPICAL_OINTMENT | Freq: Two times a day (BID) | CUTANEOUS | Status: DC

## 2012-12-27 DIAGNOSIS — L309 Dermatitis, unspecified: Secondary | ICD-10-CM | POA: Insufficient documentation

## 2012-12-27 NOTE — Progress Notes (Signed)
  Subjective:    Patient ID: Holly Cortez, female    DOB: 1961-01-17, 52 y.o.   MRN: [id number]  HPI Followup for right knee pain. We had done x-rays which showed only mild osteoarthritic changes. She's here for discussion of options. She has chronic pain 4-8/10. Worse at night. She also complains of lower extremity edema on the right.   Review of Systems As noted no swelling erythema or warmth of the right knee.    Objective:   Physical Exam  Vital signs are reviewed GENERAL: Well-developed female no acute distress did not KNEES: Mildly tender to palpation medial joint line. She has full extension and flexion. She has moderate crepitus on the right. Distal issues nerve actually intact. I see no evidence of any edema. She has intact vascular status. IMAGING: Knees x-rays were ordered standing but I can't non-standing films. Some sclerosis. Mild osteoarthritis.      Assessment & Plan:  #1 knee pain. I would think this is from arthritic change. I suspect given her history that this is a degenerative meniscus issue. We discussed multiple options she chose corticosteroid injection which we did today. #2. Regarding the lower extremity unilateral swelling that she reports is intermittent. I do not think this is related at all to her knee issues specifically not related to arthritis or meniscal issues., Have her followup with her PCP regarding that today we decided to inject her right knee and see how she does #3. Hand eczema. She has an appointment with the dermatologist but it is into a half months. I will increase the potency of the steroid cream she is using 2 triamcinolone ointment. She'll keep the dermatology.

## 2012-12-29 ENCOUNTER — Ambulatory Visit (HOSPITAL_COMMUNITY)
Admission: RE | Admit: 2012-12-29 | Discharge: 2012-12-29 | Disposition: A | Discharge status: Home / Self Care | Payer: No Typology Code available for payment source | Source: Ambulatory Visit | Attending: Internal Medicine | Admitting: Internal Medicine

## 2012-12-29 DIAGNOSIS — M25461 Effusion, right knee: Secondary | ICD-10-CM

## 2012-12-29 DIAGNOSIS — Z1231 Encounter for screening mammogram for malignant neoplasm of breast: Secondary | ICD-10-CM | POA: Insufficient documentation

## 2012-12-29 DIAGNOSIS — M1990 Unspecified osteoarthritis, unspecified site: Secondary | ICD-10-CM

## 2012-12-30 ENCOUNTER — Ambulatory Visit: Payer: No Typology Code available for payment source

## 2012-12-30 ENCOUNTER — Encounter: Payer: Self-pay | Admitting: Internal Medicine

## 2012-12-30 ENCOUNTER — Other Ambulatory Visit: Payer: Self-pay | Admitting: Internal Medicine

## 2012-12-30 ENCOUNTER — Telehealth: Payer: Self-pay

## 2012-12-30 DIAGNOSIS — R928 Other abnormal and inconclusive findings on diagnostic imaging of breast: Secondary | ICD-10-CM

## 2012-12-30 NOTE — Progress Notes (Signed)
Quick Note:  Mammogram reveals possible mass left breast- please ask to return to clinic ASAP ______

## 2012-12-30 NOTE — Telephone Encounter (Signed)
Patient made aware of mamogram results appt given

## 2012-12-30 NOTE — Telephone Encounter (Signed)
Message copied by Lestine Mount on Fri Dec 30, 2012 10:41 AM ------      Message from: Calvert Cantor      Created: Fri Dec 30, 2012 10:32 AM       Mammogram reveals possible mass left breast- please ask to return to clinic ASAP ------

## 2013-01-02 ENCOUNTER — Ambulatory Visit: Payer: No Typology Code available for payment source | Attending: Family Medicine | Admitting: Internal Medicine

## 2013-01-02 ENCOUNTER — Encounter: Payer: Self-pay | Admitting: Internal Medicine

## 2013-01-02 VITALS — BP 112/78 | HR 61 | Temp 97.9°F | Resp 16 | Ht 64.0 in | Wt 135.8 lb

## 2013-01-02 DIAGNOSIS — M1990 Unspecified osteoarthritis, unspecified site: Secondary | ICD-10-CM

## 2013-01-02 DIAGNOSIS — Z09 Encounter for follow-up examination after completed treatment for conditions other than malignant neoplasm: Secondary | ICD-10-CM | POA: Insufficient documentation

## 2013-01-02 DIAGNOSIS — N6320 Unspecified lump in the left breast, unspecified quadrant: Secondary | ICD-10-CM

## 2013-01-02 DIAGNOSIS — N63 Unspecified lump in unspecified breast: Secondary | ICD-10-CM

## 2013-01-02 DIAGNOSIS — M129 Arthropathy, unspecified: Secondary | ICD-10-CM

## 2013-01-02 NOTE — Progress Notes (Signed)
Patient Demographics  Holly Cortez, is a 52 y.o. female  ZOX:[id number]  [id number]:[id number]  DOB - 09-22-1960  Chief Complaint  Patient presents with  . Follow-up    MASS LEFT BREAST        Subjective:   Holly Cortez is here for a follow up visit. She recently underwent a diagnostic mammogram last week which shows a possible left breast mass. Dr. Butler Denmark is already ordered a diagnostic mammogram with ultrasound. Patient is here to discuss results. Currently apart from anxiety over the above-noted results, she does not have any new complaints.  Patient has No headache, No chest pain, No abdominal pain - No Nausea, No new weakness tingling or numbness, No Cough - SOB  Objective:    Filed Vitals:   01/02/13 1645  BP: 112/78  Pulse: 61  Temp: 97.9 F (36.6 C)  TempSrc: Oral  Resp: 16  Height: 5\' 4"  (1.626 m)  Weight: 135 lb 12.8 oz (61.598 kg)  SpO2: 99%     ALLERGIES:  No Known Allergies  PAST MEDICAL HISTORY: No past medical history on file.  MEDICATIONS AT HOME: Prior to Admission medications   Medication Sig Start Date End Date Taking? Authorizing Provider  aspirin 325 MG tablet Take 325 mg by mouth every 4 (four) hours as needed for pain.    Historical Provider, MD  ClonazePAM (KLONOPIN PO) Take 5 mg by mouth 2 (two) times daily.     Historical Provider, MD  Divalproex Sodium (DEPAKOTE PO) Take by mouth.    Historical Provider, MD  Melatonin 3 MG TABS Take 2 tablets by mouth at bedtime.    Historical Provider, MD  PARoxetine (PAXIL) 10 MG tablet Take 10 mg by mouth every morning.    Historical Provider, MD  triamcinolone (KENALOG) 0.025 % ointment Apply topically 2 (two) times daily. 12/26/12   Nestor Ramp, MD  zolpidem (AMBIEN) 10 MG tablet Take 10 mg by mouth at bedtime as needed for sleep.    Historical Provider, MD     Exam  General appearance :Awake, alert, not in any distress. Speech Clear. Not toxic Looking HEENT: Atraumatic and Normocephalic,  pupils equally reactive to light and accomodation Neck: supple, no JVD. No cervical lymphadenopathy.  Chest:Good air entry bilaterally, no added sounds  CVS: S1 S2 regular, no murmurs.  Abdomen: Bowel sounds present, Non tender and not distended with no gaurding, rigidity or rebound. Extremities: B/L Lower Ext shows no edema, both legs are warm to touch Neurology: Awake alert, and oriented X 3, CN II-XII intact, Non focal Skin:No Rash Wounds:N/A    Data Review   CBC No results found for this basename: WBC, HGB, HCT, PLT, MCV, MCH, MCHC, RDW, NEUTRABS, LYMPHSABS, MONOABS, EOSABS, BASOSABS, BANDABS, BANDSABD,  in the last 168 hours  Chemistries   No results found for this basename: NA, K, CL, CO2, GLUCOSE, BUN, CREATININE, GFRCGP, CALCIUM, MG, AST, ALT, ALKPHOS, BILITOT,  in the last 168 hours ------------------------------------------------------------------------------------------------------------------ No results found for this basename: HGBA1C,  in the last 72 hours ------------------------------------------------------------------------------------------------------------------ No results found for this basename: CHOL, HDL, LDLCALC, TRIG, CHOLHDL, LDLDIRECT,  in the last 72 hours ------------------------------------------------------------------------------------------------------------------ No results found for this basename: TSH, T4TOTAL, FREET3, T3FREE, THYROIDAB,  in the last 72 hours ------------------------------------------------------------------------------------------------------------------ No results found for this basename: VITAMINB12, FOLATE, FERRITIN, TIBC, IRON, RETICCTPCT,  in the last 72 hours  Coagulation profile  No results found for this basename: INR, PROTIME,  in the last 168 hours    Assessment &  Plan   Possible left breast mass - Per patient a diagnostic mammogram along with an ultrasound has been scheduled at the breast Center on 8/26-I will have  the patient come back on 8/28 for a followup visit - I spent a considerable amount of time counseling the patient and going over the results of the mammography. I also educated her regarding the next possible steps. We'll await a diagnostic mammogram and ultrasound to be done, depending on those results she may need followup with surgery. For now await further testing before referral to surgery.  Right knee osteoarthritis - Continue to followup at sports medicine  History of bipolar disorder - Stable, continue current medications  The patient was given clear instructions to go to ER or return to medical center if symptoms don't improve, worsen or new problems develop. The patient verbalized understanding. The patient was told to call to get lab results if they haven't heard anything in the next week.  `

## 2013-01-02 NOTE — Progress Notes (Signed)
PT HERE TO F/U WITH LEFT BREAST MASS DIAG LAST WEEK ON MAMMOGRAM. DENIES NIPPLE D/C,OCASS SHARP PAINS. NO REDNESS OR SWELLING NOTED. VSS

## 2013-01-17 ENCOUNTER — Ambulatory Visit
Admission: RE | Admit: 2013-01-17 | Discharge: 2013-01-17 | Disposition: A | Discharge status: Home / Self Care | Payer: No Typology Code available for payment source | Source: Ambulatory Visit | Attending: Internal Medicine | Admitting: Internal Medicine

## 2013-01-17 DIAGNOSIS — R928 Other abnormal and inconclusive findings on diagnostic imaging of breast: Secondary | ICD-10-CM

## 2013-01-20 ENCOUNTER — Ambulatory Visit (INDEPENDENT_AMBULATORY_CARE_PROVIDER_SITE_OTHER): Payer: Self-pay | Admitting: Family Medicine

## 2013-01-20 ENCOUNTER — Encounter: Payer: Self-pay | Admitting: Family Medicine

## 2013-01-20 VITALS — BP 109/74 | HR 96 | Ht 64.0 in | Wt 135.0 lb

## 2013-01-20 DIAGNOSIS — M25561 Pain in right knee: Secondary | ICD-10-CM

## 2013-01-20 DIAGNOSIS — M25569 Pain in unspecified knee: Secondary | ICD-10-CM

## 2013-01-26 NOTE — Progress Notes (Signed)
Patient ID: Holly Cortez, female   DOB: [birth year], 52 y.o.   MRN: [id number] Followup of knee pain. We injected her knee last time she received no benefit. Not even days improvement. Had no bad side effects.  Assessment: Knee pain, x-rays did not show any significant arthritis. I was hasn't been doing injection because it did not think we had a great chance of success. He did not seem to help. Whether she has some type of meniscal issue or other pain issue perhaps associated with her chronic pain syndrome is unknown to me. I don't think we have anything else to offer her. I be happy to see her back but I would not recommend repeat injection. Essentially there is nothing additional sports medicine can offer for her at this time.

## 2014-06-24 ENCOUNTER — Encounter (HOSPITAL_BASED_OUTPATIENT_CLINIC_OR_DEPARTMENT_OTHER): Payer: Self-pay | Admitting: *Deleted

## 2014-06-24 ENCOUNTER — Emergency Department (HOSPITAL_BASED_OUTPATIENT_CLINIC_OR_DEPARTMENT_OTHER)
Admission: EM | Admit: 2014-06-24 | Discharge: 2014-06-24 | Disposition: A | Discharge status: Home / Self Care | Payer: Self-pay | Attending: Emergency Medicine | Admitting: Emergency Medicine

## 2014-06-24 DIAGNOSIS — M2550 Pain in unspecified joint: Secondary | ICD-10-CM | POA: Insufficient documentation

## 2014-06-24 DIAGNOSIS — Z7952 Long term (current) use of systemic steroids: Secondary | ICD-10-CM | POA: Insufficient documentation

## 2014-06-24 DIAGNOSIS — Z720 Tobacco use: Secondary | ICD-10-CM | POA: Insufficient documentation

## 2014-06-24 DIAGNOSIS — Z79899 Other long term (current) drug therapy: Secondary | ICD-10-CM | POA: Insufficient documentation

## 2014-06-24 DIAGNOSIS — Z7982 Long term (current) use of aspirin: Secondary | ICD-10-CM | POA: Insufficient documentation

## 2014-06-24 LAB — COMPREHENSIVE METABOLIC PANEL WITH GFR
ALT: 8 U/L (ref 0–35)
AST: 14 U/L (ref 0–37)
Albumin: 3.5 g/dL (ref 3.5–5.2)
Alkaline Phosphatase: 65 U/L (ref 39–117)
Anion gap: <3 — ABNORMAL LOW (ref 5–15)
BUN: 13 mg/dL (ref 6–23)
CO2: 24 mmol/L (ref 19–32)
Calcium: 8.5 mg/dL (ref 8.4–10.5)
Chloride: 113 mmol/L — ABNORMAL HIGH (ref 96–112)
Creatinine, Ser: 0.66 mg/dL (ref 0.50–1.10)
GFR calc Af Amer: >90 mL/min
GFR calc non Af Amer: >90 mL/min
Glucose, Bld: 87 mg/dL (ref 70–99)
Potassium: 3.8 mmol/L (ref 3.5–5.1)
Sodium: 139 mmol/L (ref 135–145)
Total Bilirubin: 0.2 mg/dL — ABNORMAL LOW (ref 0.3–1.2)
Total Protein: 6.4 g/dL (ref 6.0–8.3)

## 2014-06-24 LAB — CBC
HCT: 38.6 % (ref 36.0–46.0)
Hemoglobin: 12.4 g/dL (ref 12.0–15.0)
MCH: 32 pg (ref 26.0–34.0)
MCHC: 32.1 g/dL (ref 30.0–36.0)
MCV: 99.5 fL (ref 78.0–100.0)
Platelets: 256 10*3/uL (ref 150–400)
RBC: 3.88 MIL/uL (ref 3.87–5.11)
RDW: 14.4 % (ref 11.5–15.5)
WBC: 10.2 10*3/uL (ref 4.0–10.5)

## 2014-06-24 LAB — SALICYLATE LEVEL: SALICYLATE LVL: 15.5 mg/dL (ref 2.8–20.0)

## 2014-06-24 LAB — ACETAMINOPHEN LEVEL: Acetaminophen (Tylenol), Serum: <10 ug/mL — ABNORMAL LOW (ref 10–30)

## 2014-06-24 MED ORDER — METHOCARBAMOL 500 MG PO TABS: 1000.0000 mg | ORAL_TABLET | Freq: Four times a day (QID) | ORAL | Status: DC | PRN

## 2014-06-24 MED ORDER — SODIUM CHLORIDE 0.9 % IV BOLUS (SEPSIS)
1000.0000 mL | Freq: Once | INTRAVENOUS | Status: AC
  Administered 2014-06-24: 1000 mL via INTRAVENOUS

## 2014-06-24 NOTE — ED Notes (Signed)
Patient c/o joint pain for the last year. States that it has gotten worse in the last several months.

## 2014-06-24 NOTE — ED Provider Notes (Signed)
CSN: [id number]     Arrival date & time 06/24/14  1123 History   First MD Initiated Contact with Patient 06/24/14 1303     Chief Complaint  Patient presents with  . Joint Pain     (Consider location/radiation/quality/duration/timing/severity/associated sxs/prior Treatment) HPI   Holly Cortez is a 54 y.o. female complaining of  joint pain x57yr. She states is has been worsening over the last 70mo. When asked what changed to make her visit the ED today she said, "Nothing, I just want you to tell me what is wrong with me." She endorses pain in all extremity joints, but is most concerned about her knees. She states the pain is constant and rates it 6/10. She describes the pain an ache that is in muscles and joints in upper extremities, but only in the joints in lower extremities. The pain is worsened with weight bearing and movement, but is not completely relieved by rest. She denies being evaluated recently by PCP or orthopaedics for this issue. She denies any fever chills, recent swelling in any joint, recent weight loss, stiff neck, trouble swallowing, CP, SOB/DOE, gait abnormalities, or syncope/near syncope. She states that she's been taking 12x 325 mg aspirin for pain control per day for several days. States that sometimes she has a tinnitus in her ears. She denies fever, chills, nausea, vomiting.    History reviewed. No pertinent past medical history. Past Surgical History  Procedure Laterality Date  . Appendectomy     Family History  Problem Relation Age of Onset  . Heart disease Mother   . Hyperlipidemia Father    History  Substance Use Topics  . Smoking status: Current Every Day Smoker -- 1.00 packs/day    Types: Cigarettes  . Smokeless tobacco: Not on file  . Alcohol Use: No   OB History    No data available     Review of Systems  10 systems reviewed and found to be negative, except as noted in the HPI.   Allergies  Benadryl  Home Medications   Prior to Admission  medications   Medication Sig Start Date End Date Taking? Authorizing Provider  citalopram (CELEXA) 20 MG tablet Take 30 mg by mouth daily.   Yes Historical Provider, MD  aspirin 325 MG tablet Take 325 mg by mouth every 4 (four) hours as needed for pain.    Historical Provider, MD  ClonazePAM (KLONOPIN PO) Take 5 mg by mouth 2 (two) times daily.     Historical Provider, MD  Divalproex Sodium (DEPAKOTE PO) Take by mouth.    Historical Provider, MD  Melatonin 3 MG TABS Take 2 tablets by mouth at bedtime.    Historical Provider, MD  PARoxetine (PAXIL) 10 MG tablet Take 10 mg by mouth every morning.    Historical Provider, MD  triamcinolone (KENALOG) 0.025 % ointment Apply topically 2 (two) times daily. 12/26/12   Nestor Ramp, MD  zolpidem (AMBIEN) 10 MG tablet Take 10 mg by mouth at bedtime as needed for sleep.    Historical Provider, MD   BP 114/66 mmHg  Pulse 80  Temp(Src) 98.2 F (36.8 C) (Oral)  Resp 18  Ht  (1.626 m)  Wt 153 lb (69.4 kg)  BMI 26.25 kg/m2  SpO2 100%  LMP 05/27/2011 Physical Exam  Constitutional: She is oriented to person, place, and time. She appears well-developed and well-nourished. No distress.  HENT:  Head: Normocephalic and atraumatic.  Mouth/Throat: Oropharynx is clear and moist.  Eyes: Conjunctivae and EOM  are normal. Pupils are equal, round, and reactive to light.  Neck: Normal range of motion. Neck supple.  Cardiovascular: Normal rate, regular rhythm and intact distal pulses.   Pulmonary/Chest: Effort normal and breath sounds normal. No stridor. No respiratory distress. She has no wheezes. She has no rales. She exhibits no tenderness.  Abdominal: Soft. Bowel sounds are normal. She exhibits no distension and no mass. There is no tenderness. There is no rebound and no guarding.  Musculoskeletal: Normal range of motion. She exhibits no edema or tenderness.  Active full range of motion to all joints with no warmth, effusion or crepitance.  Neurological: She  is alert and oriented to person, place, and time.  Psychiatric: She has a normal mood and affect.  Nursing note and vitals reviewed.   ED Course  Procedures (including critical care time) Labs Review Labs Reviewed - No data to display  Imaging Review No results found.   EKG Interpretation None      MDM   Final diagnoses:  None    Filed Vitals:   06/24/14 1133 06/24/14 1429 06/24/14 1543  BP: 114/66 101/56 112/62  Pulse: 80 55 60  Temp: 98.2 F (36.8 C)    TempSrc: Oral    Resp: 18 18 18   Height: 5\' 4"  (1.626 m)    Weight: 153 lb (69.4 kg)    SpO2: 100% 98% 97%    Medications  sodium chloride 0.9 % bolus 1,000 mL (0 mLs Intravenous Stopped 06/24/14 1545)    Holly Cortez is a pleasant 54 y.o. female presenting with chronic diffuse arthralgia. I've advised the patient that she may need a rheumatologic evaluation. I have given her referral to the wellness Center who will help her obtain primary care in addition to the orange card. I'm concerned that she has accidentally overdosed on salicylates states that aspirin is the only thing that controls her pain and she taken over the maximum limit of aspirin for the last several days in a row. Patient with stable vital signs, normal salicylate and creatinine level.  Discussed case with attending MD who agrees with plan and stability to d/c to home.    Evaluation does not show pathology that would require ongoing emergent intervention or inpatient treatment. Pt is hemodynamically stable and mentating appropriately. Discussed findings and plan with patient/guardian, who agrees with care plan. All questions answered. Return precautions discussed and outpatient follow up given.   Discharge Medication List as of 06/24/2014  3:39 PM    START taking these medications   Details  methocarbamol (ROBAXIN) 500 MG tablet Take 2 tablets (1,000 mg total) by mouth 4 (four) times daily as needed (Pain)., Starting 06/24/2014, Until  Discontinued, Print             Wynetta Emeryicole Doretta Remmert, PA-C 06/25/14 1520  Elwin MochaBlair Walden, MD 06/27/14 [phone]

## 2014-06-24 NOTE — Discharge Instructions (Signed)
You can also take  tylenol (acetaminophen) 975mg  (this is 3 over the counter pills) four times a day. Do not drink alcohol or combine with other medications that have acetaminophen as an ingredient (Read the labels!).    For breakthrough pain you may take Robaxin. Do not drink alcohol, drive or operate heavy machinery when taking Robaxin.    Arthralgia Arthralgia is joint pain. A joint is a place where two bones meet. Joint pain can happen for many reasons. The joint can be bruised, stiff, infected, or weak from aging. Pain usually goes away after resting and taking medicine for soreness.  HOME CARE  Rest the joint as told by your doctor.  Keep the sore joint raised (elevated) for the first 24 hours.  Put ice on the joint area.  Put ice in a plastic bag.  Place a towel between your skin and the bag.  Leave the ice on for 15-20 minutes, 03-04 times a day.  Wear your splint, casting, elastic bandage, or sling as told by your doctor.  Only take medicine as told by your doctor. Do not take aspirin.  Use crutches as told by your doctor. Do not put weight on the joint until told to by your doctor. GET HELP RIGHT AWAY IF:   You have bruising, puffiness (swelling), or more pain.  Your fingers or toes turn blue or start to lose feeling (numb).  Your medicine does not lessen the pain.  Your pain becomes severe.  You have a temperature by mouth above 102 F (38.9 C), not controlled by medicine.  You cannot move or use the joint. MAKE SURE YOU:   Understand these instructions.  Will watch your condition.  Will get help right away if you are not doing well or get worse. Document Released: 04/29/2009 Document Revised: 08/03/2011 Document Reviewed: 04/29/2009 [facility] Patient Information 2015 [place], [place]. This information is not intended to replace advice given to you by your health care provider. Make sure you discuss any questions you have with your health care provider.

## 2015-04-24 ENCOUNTER — Inpatient Hospital Stay (HOSPITAL_BASED_OUTPATIENT_CLINIC_OR_DEPARTMENT_OTHER)
Admission: EM | Admit: 2015-04-24 | Discharge: 2015-04-27 | DRG: 193 | Disposition: A | Discharge status: Home / Self Care | Payer: Self-pay | Attending: Internal Medicine | Admitting: Internal Medicine

## 2015-04-24 ENCOUNTER — Emergency Department (HOSPITAL_BASED_OUTPATIENT_CLINIC_OR_DEPARTMENT_OTHER): Payer: Self-pay

## 2015-04-24 ENCOUNTER — Encounter (HOSPITAL_BASED_OUTPATIENT_CLINIC_OR_DEPARTMENT_OTHER): Payer: Self-pay

## 2015-04-24 DIAGNOSIS — J189 Pneumonia, unspecified organism: Principal | ICD-10-CM | POA: Diagnosis present

## 2015-04-24 DIAGNOSIS — F319 Bipolar disorder, unspecified: Secondary | ICD-10-CM | POA: Diagnosis present

## 2015-04-24 DIAGNOSIS — IMO0001 Reserved for inherently not codable concepts without codable children: Secondary | ICD-10-CM | POA: Diagnosis present

## 2015-04-24 DIAGNOSIS — J440 Chronic obstructive pulmonary disease with acute lower respiratory infection: Secondary | ICD-10-CM | POA: Diagnosis present

## 2015-04-24 DIAGNOSIS — Z79899 Other long term (current) drug therapy: Secondary | ICD-10-CM

## 2015-04-24 DIAGNOSIS — J9601 Acute respiratory failure with hypoxia: Secondary | ICD-10-CM | POA: Diagnosis present

## 2015-04-24 DIAGNOSIS — J439 Emphysema, unspecified: Secondary | ICD-10-CM | POA: Diagnosis present

## 2015-04-24 DIAGNOSIS — F17210 Nicotine dependence, cigarettes, uncomplicated: Secondary | ICD-10-CM | POA: Diagnosis present

## 2015-04-24 DIAGNOSIS — J209 Acute bronchitis, unspecified: Secondary | ICD-10-CM | POA: Diagnosis present

## 2015-04-24 DIAGNOSIS — Z7982 Long term (current) use of aspirin: Secondary | ICD-10-CM

## 2015-04-24 DIAGNOSIS — R0902 Hypoxemia: Secondary | ICD-10-CM

## 2015-04-24 HISTORY — DX: Bipolar disorder, unspecified: F31.9

## 2015-04-24 LAB — CBC WITH DIFFERENTIAL/PLATELET
BASOS PCT: 0 %
Basophils Absolute: 0 10*3/uL (ref 0.0–0.1)
Eosinophils Absolute: 0.1 10*3/uL (ref 0.0–0.7)
Eosinophils Relative: 1 %
HEMATOCRIT: 36 % (ref 36.0–46.0)
HEMOGLOBIN: 11.7 g/dL — AB (ref 12.0–15.0)
LYMPHS ABS: 1.8 10*3/uL (ref 0.7–4.0)
Lymphocytes Relative: 13 %
MCH: 32 pg (ref 26.0–34.0)
MCHC: 32.5 g/dL (ref 30.0–36.0)
MCV: 98.4 fL (ref 78.0–100.0)
MONOS PCT: 11 %
Monocytes Absolute: 1.5 10*3/uL — ABNORMAL HIGH (ref 0.1–1.0)
NEUTROS PCT: 75 %
Neutro Abs: 10.1 10*3/uL — ABNORMAL HIGH (ref 1.7–7.7)
Platelets: 282 10*3/uL (ref 150–400)
RBC: 3.66 MIL/uL — ABNORMAL LOW (ref 3.87–5.11)
RDW: 14.3 % (ref 11.5–15.5)
WBC: 13.6 10*3/uL — ABNORMAL HIGH (ref 4.0–10.5)

## 2015-04-24 LAB — COMPREHENSIVE METABOLIC PANEL
ALBUMIN: 3.1 g/dL — AB (ref 3.5–5.0)
ALT: 11 U/L — ABNORMAL LOW (ref 14–54)
AST: 30 U/L (ref 15–41)
Alkaline Phosphatase: 90 U/L (ref 38–126)
Anion gap: 8 (ref 5–15)
BUN: 15 mg/dL (ref 6–20)
CO2: 22 mmol/L (ref 22–32)
Calcium: 8.6 mg/dL — ABNORMAL LOW (ref 8.9–10.3)
Chloride: 107 mmol/L (ref 101–111)
Creatinine, Ser: 0.96 mg/dL (ref 0.44–1.00)
GFR calc Af Amer: >60 mL/min (ref >60)
GFR calc non Af Amer: >60 mL/min (ref >60)
GLUCOSE: 159 mg/dL — AB (ref 65–99)
POTASSIUM: 3.7 mmol/L (ref 3.5–5.1)
Sodium: 137 mmol/L (ref 135–145)
Total Bilirubin: 0.4 mg/dL (ref 0.3–1.2)
Total Protein: 7.2 g/dL (ref 6.5–8.1)

## 2015-04-24 LAB — I-STAT ARTERIAL BLOOD GAS, ED
ACID-BASE DEFICIT: 3 mmol/L — AB (ref 0.0–2.0)
BICARBONATE: 22.1 meq/L (ref 20.0–24.0)
O2 Saturation: 79 %
PCO2 ART: 38.3 mmHg (ref 35.0–45.0)
Patient temperature: 98.5
TCO2: 23 mmol/L (ref 0–100)
pH, Arterial: 7.37 (ref 7.350–7.450)
pO2, Arterial: 45 mmHg — ABNORMAL LOW (ref 80.0–100.0)

## 2015-04-24 LAB — BRAIN NATRIURETIC PEPTIDE: B NATRIURETIC PEPTIDE 5: 79 pg/mL (ref 0.0–100.0)

## 2015-04-24 LAB — TROPONIN I: Troponin I: <0.03 ng/mL (ref <0.031)

## 2015-04-24 MED ORDER — IPRATROPIUM-ALBUTEROL 0.5-2.5 (3) MG/3ML IN SOLN
3.0000 mL | Freq: Once | RESPIRATORY_TRACT | Status: AC
  Administered 2015-04-24: 3 mL via RESPIRATORY_TRACT
  Filled 2015-04-24: qty 3

## 2015-04-24 MED ORDER — DEXTROSE 5 % IV SOLN
500.0000 mg | Freq: Once | INTRAVENOUS | Status: AC
  Administered 2015-04-24: 500 mg via INTRAVENOUS

## 2015-04-24 MED ORDER — METHYLPREDNISOLONE SODIUM SUCC 125 MG IJ SOLR
125.0000 mg | Freq: Once | INTRAMUSCULAR | Status: AC
  Administered 2015-04-24: 125 mg via INTRAVENOUS
  Filled 2015-04-24: qty 2

## 2015-04-24 MED ORDER — CEFTRIAXONE SODIUM 1 G IJ SOLR
INTRAMUSCULAR | Status: AC
  Filled 2015-04-24: qty 10

## 2015-04-24 MED ORDER — DEXTROSE 5 % IV SOLN
1.0000 g | Freq: Once | INTRAVENOUS | Status: AC
  Administered 2015-04-24: 1 g via INTRAVENOUS

## 2015-04-24 MED ORDER — AZITHROMYCIN 500 MG IV SOLR
INTRAVENOUS | Status: AC
  Filled 2015-04-24: qty 500

## 2015-04-24 NOTE — ED Notes (Addendum)
C/o SOB with minimal cough x 2 days with CP-upon arrival pt drowsy but sister states pt has taken [place]-pt is alert/oriented-answering all ?s appropriately and appears in no distress

## 2015-04-24 NOTE — ED Notes (Signed)
[facility]pt placed on O2 [place] then switched to 100 NRB due to sats remained in 70s

## 2015-04-24 NOTE — Progress Notes (Signed)
Placed patient on 50% venti mask.  Patient's SPO2 remains at 95%

## 2015-04-24 NOTE — ED Provider Notes (Signed)
CSN: [id number]     Arrival date & time 04/24/15  2142 History  By signing my name below, I, Soijett Blue, attest that this documentation has been prepared under the direction and in the presence of Geoffery Lyons, MD. Electronically Signed: Soijett Blue, ED Scribe. 04/24/2015. 10:08 PM.   Chief Complaint  Patient presents with  . Shortness of Breath      Patient is a 54 y.o. female presenting with shortness of breath. The history is provided by the patient and a relative. No language interpreter was used.  Shortness of Breath Severity:  Moderate Onset quality:  Sudden Duration:  3 days Timing:  Constant Progression:  Unchanged Chronicity:  New Context: smoke exposure   Relieved by:  None tried Worsened by:  Nothing tried Ineffective treatments:  None tried Associated symptoms: chest pain and cough   Associated symptoms: no sputum production     Holly Cortez is a 54 y.o. female who presents to the Emergency Department complaining of sudden, moderate SOB x 3 days. Sister notes that the pt went to [facility] and during the car ride, the sister reports that the pt has been acting off as of lately. Pt sister reports that the patient has had labored breathing and that today she was talking about a "Corrie Dandy" who had been with them today but no one was there but her sister and mother. Pt is having associated symptoms of mild non-productive cough, pressure like CP and visual hallucinations. She notes that she has not tried any medications for the relief of her symptoms. She denies calf tenderness, lower leg swelling, and any other symptoms. Denies PMHx of COPD, asthma, emphysema, or lung issues. She notes that she smokes 0.5 PPD for many years. Pt sister notes that the pt takes depakote and ambien.    Past Medical History  Diagnosis Date  . Bipolar disorder [facility])    Past Surgical History  Procedure Laterality Date  . Appendectomy     Family History  Problem Relation Age of Onset  . Heart  disease Mother   . Hyperlipidemia Father    Social History  Substance Use Topics  . Smoking status: Current Every Day Smoker -- 1.00 packs/day    Types: Cigarettes  . Smokeless tobacco: None  . Alcohol Use: Yes     Comment: 2/week   OB History    No data available     Review of Systems  Respiratory: Positive for cough and shortness of breath. Negative for sputum production.   Cardiovascular: Positive for chest pain.  All other systems reviewed and are negative.     Allergies  Benadryl  Home Medications   Prior to Admission medications   Medication Sig Start Date End Date Taking? Authorizing Provider  aspirin 325 MG tablet Take 325 mg by mouth every 4 (four) hours as needed for pain.    Historical Provider, MD  Divalproex Sodium (DEPAKOTE PO) Take by mouth.    Historical Provider, MD  Melatonin 3 MG TABS Take 2 tablets by mouth at bedtime.    Historical Provider, MD  methocarbamol (ROBAXIN) 500 MG tablet Take 2 tablets (1,000 mg total) by mouth 4 (four) times daily as needed (Pain). 06/24/14   Nicole Pisciotta, PA-C  PARoxetine (PAXIL) 10 MG tablet Take 10 mg by mouth every morning.    Historical Provider, MD  triamcinolone (KENALOG) 0.025 % ointment Apply topically 2 (two) times daily. 12/26/12   Nestor Ramp, MD  zolpidem (AMBIEN) 10 MG tablet Take 10 mg  by mouth at bedtime as needed for sleep.    Historical Provider, MD   BP 99/66 mmHg  Pulse 114  Temp(Src) 98.5 F (36.9 C) (Oral)  Resp 20  Ht  (1.626 m)  Wt 132 lb (59.875 kg)  BMI 22.65 kg/m2  SpO2 95%  LMP 05/27/2011 Physical Exam  Constitutional: She is oriented to person, place, and time. She appears well-developed and well-nourished. No distress.  HENT:  Head: Normocephalic and atraumatic.  Mouth/Throat: Oropharynx is clear and moist.  Eyes: EOM are normal.  Neck: Neck supple.  Cardiovascular: Normal rate, regular rhythm and normal heart sounds.  Exam reveals no gallop and no friction rub.   No murmur  heard. Pulmonary/Chest: Effort normal. No respiratory distress. She has no wheezes. She has rales.  Rales in the bases bilaterally.   Musculoskeletal: Normal range of motion.  Neurological: She is alert and oriented to person, place, and time.  Skin: Skin is warm and dry. She is not diaphoretic.  Psychiatric: She has a normal mood and affect. Her behavior is normal.  Nursing note and vitals reviewed.   ED Course  Procedures (including critical care time) DIAGNOSTIC STUDIES: Oxygen Saturation is 65% on RA, low by my interpretation.    COORDINATION OF CARE: 10:08 PM Discussed treatment plan with pt at bedside which includes breathing treatment, labs, and CXR and pt agreed to plan.    Labs Review Labs Reviewed  COMPREHENSIVE METABOLIC PANEL - Abnormal; Notable for the following:    Glucose, Bld 159 (*)    Calcium 8.6 (*)    Albumin 3.1 (*)    ALT 11 (*)    All other components within normal limits  CBC WITH DIFFERENTIAL/PLATELET - Abnormal; Notable for the following:    WBC 13.6 (*)    RBC 3.66 (*)    Hemoglobin 11.7 (*)    Neutro Abs 10.1 (*)    Monocytes Absolute 1.5 (*)    All other components within normal limits  I-STAT ARTERIAL BLOOD GAS, ED - Abnormal; Notable for the following:    pO2, Arterial 45.0 (*)    Acid-base deficit 3.0 (*)    All other components within normal limits  BRAIN NATRIURETIC PEPTIDE  TROPONIN I  BLOOD GAS, ARTERIAL    Imaging Review Dg Chest Port 1 View  04/24/2015  CLINICAL DATA:  Fever cough and sore throat for 2 days. EXAM: PORTABLE CHEST 1 VIEW COMPARISON:  None. FINDINGS: There is mild interstitial coarsening, likely chronic fibrotic disease. A component of reversible interstitial fluid or infiltrate is not entirely excluded. There is no confluent airspace opacity. There is no large effusion. Hilar and mediastinal contours are unremarkable. IMPRESSION: Extensive chronic appearing interstitial coarsening which is likely fibrotic. Cannot  completely exclude a component of reversible interstitial fluid or infiltrate. Electronically Signed   By: Ellery Plunk M.D.   On: 04/24/2015 22:54   I have personally reviewed and evaluated these images and lab results as part of my medical decision-making.   EKG Interpretation   Date/Time:  Wednesday April 24 2015 21:52:57 EST Ventricular Rate:  114 PR Interval:  138 QRS Duration: 88 QT Interval:  328 QTC Calculation: 452 R Axis:   13 Text Interpretation:  Sinus tachycardia Otherwise normal ECG Confirmed by  Wade Asebedo  MD, Shaquira Moroz ([id number]) on 04/24/2015 10:08:19 PM      MDM   Final diagnoses:  None    Patient presents with complaints of shortness of breath, worsening over the past several days. Her chest  x-ray revealed what appears to be increased interstitial lung markings consistent with chronic lung disease and what appears to be a superimposed bilateral infiltrate. She will be treated with Rocephin and Zithromax and she has already received a DuoNeb and steroids. I've spoken with Dr. Toniann FailKakrakandy who agrees to admit.  I personally performed the services described in this documentation, which was scribed in my presence. The recorded information has been reviewed and is accurate.       Geoffery Lyonsouglas Claudia Greenley, MD 04/24/15 [phone]

## 2015-04-25 ENCOUNTER — Inpatient Hospital Stay (HOSPITAL_COMMUNITY): Payer: Self-pay

## 2015-04-25 ENCOUNTER — Encounter (HOSPITAL_COMMUNITY): Payer: Self-pay | Admitting: Certified Registered Nurse Anesthetist

## 2015-04-25 DIAGNOSIS — R609 Edema, unspecified: Secondary | ICD-10-CM

## 2015-04-25 DIAGNOSIS — J9601 Acute respiratory failure with hypoxia: Secondary | ICD-10-CM | POA: Diagnosis present

## 2015-04-25 DIAGNOSIS — R0602 Shortness of breath: Secondary | ICD-10-CM

## 2015-04-25 DIAGNOSIS — J189 Pneumonia, unspecified organism: Secondary | ICD-10-CM | POA: Diagnosis present

## 2015-04-25 DIAGNOSIS — R0902 Hypoxemia: Secondary | ICD-10-CM

## 2015-04-25 LAB — CBC
HEMATOCRIT: 34.8 % — AB (ref 36.0–46.0)
Hemoglobin: 11.1 g/dL — ABNORMAL LOW (ref 12.0–15.0)
MCH: 31.6 pg (ref 26.0–34.0)
MCHC: 31.9 g/dL (ref 30.0–36.0)
MCV: 99.1 fL (ref 78.0–100.0)
PLATELETS: 273 10*3/uL (ref 150–400)
RBC: 3.51 MIL/uL — AB (ref 3.87–5.11)
RDW: 14.5 % (ref 11.5–15.5)
WBC: 10.1 10*3/uL (ref 4.0–10.5)

## 2015-04-25 LAB — BASIC METABOLIC PANEL
Anion gap: 9 (ref 5–15)
BUN: 9 mg/dL (ref 6–20)
CO2: 24 mmol/L (ref 22–32)
Calcium: 8.8 mg/dL — ABNORMAL LOW (ref 8.9–10.3)
Chloride: 108 mmol/L (ref 101–111)
Creatinine, Ser: 0.75 mg/dL (ref 0.44–1.00)
GFR calc Af Amer: >60 mL/min (ref >60)
GLUCOSE: 147 mg/dL — AB (ref 65–99)
POTASSIUM: 3.9 mmol/L (ref 3.5–5.1)
Sodium: 141 mmol/L (ref 135–145)

## 2015-04-25 LAB — HIV ANTIBODY (ROUTINE TESTING W REFLEX): HIV Screen 4th Generation wRfx: NONREACTIVE

## 2015-04-25 LAB — MRSA PCR SCREENING: MRSA by PCR: NEGATIVE

## 2015-04-25 MED ORDER — IOHEXOL 350 MG/ML SOLN
80.0000 mL | Freq: Once | INTRAVENOUS | Status: AC | PRN
  Administered 2015-04-25: 80 mL via INTRAVENOUS

## 2015-04-25 MED ORDER — ACETAMINOPHEN 325 MG PO TABS
650.0000 mg | ORAL_TABLET | Freq: Four times a day (QID) | ORAL | Status: DC | PRN
  Administered 2015-04-25 – 2015-04-26 (×3): 650 mg via ORAL
  Filled 2015-04-25 (×3): qty 2

## 2015-04-25 MED ORDER — HEPARIN SODIUM (PORCINE) 5000 UNIT/ML IJ SOLN
5000.0000 [IU] | Freq: Three times a day (TID) | INTRAMUSCULAR | Status: DC
  Administered 2015-04-25 – 2015-04-27 (×6): 5000 [IU] via SUBCUTANEOUS
  Filled 2015-04-25 (×6): qty 1

## 2015-04-25 MED ORDER — PNEUMOCOCCAL VAC POLYVALENT 25 MCG/0.5ML IJ INJ
0.5000 mL | INJECTION | INTRAMUSCULAR | Status: AC
  Administered 2015-04-27: 0.5 mL via INTRAMUSCULAR
  Filled 2015-04-25: qty 0.5

## 2015-04-25 MED ORDER — MENTHOL 3 MG MT LOZG: 1.0000 | LOZENGE | OROMUCOSAL | Status: DC | PRN

## 2015-04-25 MED ORDER — ALBUTEROL SULFATE (2.5 MG/3ML) 0.083% IN NEBU: 2.5000 mg | INHALATION_SOLUTION | RESPIRATORY_TRACT | Status: DC | PRN

## 2015-04-25 MED ORDER — METHYLPREDNISOLONE SODIUM SUCC 125 MG IJ SOLR
60.0000 mg | Freq: Two times a day (BID) | INTRAMUSCULAR | Status: DC
  Administered 2015-04-25 – 2015-04-27 (×5): 60 mg via INTRAVENOUS
  Filled 2015-04-25 (×6): qty 2

## 2015-04-25 MED ORDER — IPRATROPIUM-ALBUTEROL 0.5-2.5 (3) MG/3ML IN SOLN
3.0000 mL | Freq: Three times a day (TID) | RESPIRATORY_TRACT | Status: DC
  Administered 2015-04-25 – 2015-04-27 (×6): 3 mL via RESPIRATORY_TRACT
  Filled 2015-04-25 (×6): qty 3

## 2015-04-25 MED ORDER — DEXTROSE 5 % IV SOLN
1.0000 g | INTRAVENOUS | Status: DC
  Administered 2015-04-25 – 2015-04-26 (×2): 1 g via INTRAVENOUS
  Filled 2015-04-25 (×3): qty 10

## 2015-04-25 MED ORDER — IPRATROPIUM-ALBUTEROL 0.5-2.5 (3) MG/3ML IN SOLN
3.0000 mL | Freq: Four times a day (QID) | RESPIRATORY_TRACT | Status: DC
  Administered 2015-04-25: 3 mL via RESPIRATORY_TRACT
  Filled 2015-04-25: qty 3

## 2015-04-25 MED ORDER — SODIUM CHLORIDE 0.9 % IJ SOLN
3.0000 mL | Freq: Two times a day (BID) | INTRAMUSCULAR | Status: DC
  Administered 2015-04-25 – 2015-04-27 (×6): 3 mL via INTRAVENOUS

## 2015-04-25 MED ORDER — INFLUENZA VAC SPLIT QUAD 0.5 ML IM SUSY
0.5000 mL | PREFILLED_SYRINGE | INTRAMUSCULAR | Status: AC
  Administered 2015-04-27: 0.5 mL via INTRAMUSCULAR
  Filled 2015-04-25: qty 0.5

## 2015-04-25 MED ORDER — DEXTROSE 5 % IV SOLN
250.0000 mg | INTRAVENOUS | Status: DC
  Administered 2015-04-25 – 2015-04-26 (×2): 250 mg via INTRAVENOUS
  Filled 2015-04-25 (×3): qty 250

## 2015-04-25 MED ORDER — CETYLPYRIDINIUM CHLORIDE 0.05 % MT LIQD
7.0000 mL | Freq: Two times a day (BID) | OROMUCOSAL | Status: DC
  Administered 2015-04-25 – 2015-04-27 (×5): 7 mL via OROMUCOSAL

## 2015-04-25 NOTE — Plan of Care (Signed)
Was called by Dr. Judd Lienelo for admitting [name] for acute respiratory failure. Patient presented with shortness of breath. Was found to be hypoxic and initially was placed on 100% nonrebreather and presently is on 50% Ventimask. Patient's chest x-ray shows bilateral chronic fibrotic changes with possible superadded pneumonia. Patient was started on antibiotics for possible pneumonia and will be admitted for further management and workup for acute hypoxic respiratory failure.  Holly MiniumArshad Quentin Cortez.

## 2015-04-25 NOTE — Progress Notes (Signed)
Patient seen and examined this morning, agree with H&P. Pleasant 54 yo F admitted with hypoxic respiratory failure without apparent etiology, no prior history of lung or heart disease. She is currently a smoker. She is on 50% FIO2 this morning.   Acute hypoxic respiratory failure - with chest pain and hypoxia r/o PE, obtain CT angio - 2D echo - continue antibiotics for CAP  Tobacco abuse - counseled for cessation  Costin M. Elvera LennoxGherghe, MD [facility] [phone]

## 2015-04-25 NOTE — H&P (Signed)
[facility] History and Physical  Holly Cortez MWU:[id number] DOB: 07/02/1960 DOA: 04/24/2015  Referring physician: EDP PCP: Jeanann LewandowskyJEGEDE, OLUGBEMIGA, MD   Chief Complaint: SOB   HPI: Holly Cortez is a 54 y.o. female with no known history of prior lung problems.  She presents to the ED with 3 day history of worsening SOB.  Mild non-productive cough, chest pain.  [facility]  She was noted to be wheezing on arrival to ED, given steroids and CAP treatment.  Review of Systems: Systems reviewed.  As above, otherwise negative  Past Medical History  Diagnosis Date  . Bipolar disorder [facility](HCC)    Past Surgical History  Procedure Laterality Date  . Appendectomy     Social History:  reports that she has been smoking Cigarettes.  She has been smoking about 1.00 pack per day. She does not have any smokeless tobacco history on file. She reports that she drinks alcohol. She reports that she does not use illicit drugs.  Allergies  Allergen Reactions  . Benadryl [Diphenhydramine]     Family History  Problem Relation Age of Onset  . Heart disease Mother   . Hyperlipidemia Father      Prior to Admission medications   Medication Sig Start Date End Date Taking? Authorizing Provider  aspirin 325 MG tablet Take 325 mg by mouth every 4 (four) hours as needed for pain.    Historical Provider, MD  Divalproex Sodium (DEPAKOTE PO) Take by mouth.    Historical Provider, MD  Melatonin 3 MG TABS Take 2 tablets by mouth at bedtime.    Historical Provider, MD  methocarbamol (ROBAXIN) 500 MG tablet Take 2 tablets (1,000 mg total) by mouth 4 (four) times daily as needed (Pain). 06/24/14   Nicole Pisciotta, PA-C  PARoxetine (PAXIL) 10 MG tablet Take 10 mg by mouth every morning.    Historical Provider, MD  triamcinolone (KENALOG) 0.025 % ointment Apply topically 2 (two) times daily. 12/26/12   Nestor RampSara L Neal, MD  zolpidem (AMBIEN) 10 MG tablet Take 10 mg by  mouth at bedtime as needed for sleep.    Historical Provider, MD   Physical Exam: Filed Vitals:   04/25/15 0145 04/25/15 0200  BP: 113/78 115/73  Pulse: 98 98  Temp:    Resp: 23 17    BP 115/73 mmHg  Pulse 98  Temp(Src) 98.5 F (36.9 C) (Oral)  Resp 17  Ht 5\' 4"  (1.626 m)  Wt 73.4 kg (161 lb 13.1 oz)  BMI 27.76 kg/m2  SpO2 96%  LMP 05/27/2011  General Appearance:    Alert, oriented, no distress, appears stated age  Head:    Normocephalic, atraumatic  Eyes:    PERRL, EOMI, sclera non-icteric        Nose:   Nares without drainage or epistaxis. Mucosa, turbinates normal  Throat:   Moist mucous membranes. Oropharynx without erythema or exudate.  Neck:   Supple. No carotid bruits.  No thyromegaly.  No lymphadenopathy.   Back:     No CVA tenderness, no spinal tenderness  Lungs:     Clear to auscultation bilaterally, without wheezes, rhonchi or rales  Chest wall:    No tenderness to palpitation  Heart:    Regular rate and rhythm without murmurs, gallops, rubs  Abdomen:     Soft, non-tender, nondistended, normal bowel sounds, no organomegaly  Genitalia:    deferred  Rectal:    deferred  Extremities:   No clubbing, cyanosis  or edema.  Pulses:   2+ and symmetric all extremities  Skin:   Skin color, texture, turgor normal, no rashes or lesions  Lymph nodes:   Cervical, supraclavicular, and axillary nodes normal  Neurologic:   CNII-XII intact. Normal strength, sensation and reflexes      throughout    Labs on Admission:  Basic Metabolic Panel:  Recent Labs Lab 04/24/15 2200  NA 137  K 3.7  CL 107  CO2 22  GLUCOSE 159*  BUN 15  CREATININE 0.96  CALCIUM 8.6*   Liver Function Tests:  Recent Labs Lab 04/24/15 2200  AST 30  ALT 11*  ALKPHOS 90  BILITOT 0.4  PROT 7.2  ALBUMIN 3.1*   No results for input(s): LIPASE, AMYLASE in the last 168 hours. No results for input(s): AMMONIA in the last 168 hours. CBC:  Recent Labs Lab 04/24/15 2200  WBC 13.6*   NEUTROABS 10.1*  HGB 11.7*  HCT 36.0  MCV 98.4  PLT 282   Cardiac Enzymes:  Recent Labs Lab 04/24/15 2200  TROPONINI <0.03    BNP (last 3 results) No results for input(s): PROBNP in the last 8760 hours. CBG: No results for input(s): GLUCAP in the last 168 hours.  Radiological Exams on Admission: Dg Chest Port 1 View  04/24/2015  CLINICAL DATA:  Fever cough and sore throat for 2 days. EXAM: PORTABLE CHEST 1 VIEW COMPARISON:  None. FINDINGS: There is mild interstitial coarsening, likely chronic fibrotic disease. A component of reversible interstitial fluid or infiltrate is not entirely excluded. There is no confluent airspace opacity. There is no large effusion. Hilar and mediastinal contours are unremarkable. IMPRESSION: Extensive chronic appearing interstitial coarsening which is likely fibrotic. Cannot completely exclude a component of reversible interstitial fluid or infiltrate. Electronically Signed   By: Ellery Plunk M.D.   On: 04/24/2015 22:54    EKG: Independently reviewed.  Assessment/Plan Principal Problem:   Acute respiratory failure with hypoxia (HCC) Active Problems:   CAP (community acquired pneumonia)   1. Acute resp failure with hypoxia - 1. CXR shows probably chronic fibrotic changes, there is question of superimposed CAP 2. Will go ahead and keep patient on ABx for possible CAP for now 3. She certainly seems to have responded well to steroids, continue these as well for the moment 4. Adult wheeze protocol for breathing treatments 5. CT chest with contrast is pending 6. HIV screen pending (if positive everything would be very classic for PJP, so would add empiric bactrim at that point) 7. Sputum culture pending, dosent have fever though and isnt coughing anything up, so somewhat atypical presentation for CAP.    Code Status: Full  Family Communication: Family at bedside Disposition Plan: Admit to inpatient   Time spent: 70 min  Grayson White  M. [facility] Pager [phone]  If 7AM-7PM, please contact the day team taking care of the patient [email or web address] Password [email or web address] 04/25/2015, 2:13 AM

## 2015-04-25 NOTE — Progress Notes (Signed)
*  PRELIMINARY RESULTS* Echocardiogram 2D Echocardiogram has been performed.  Jeryl Columbialliott, Janna Oak 04/25/2015, 9:22 AM

## 2015-04-25 NOTE — Progress Notes (Signed)
VASCULAR LAB PRELIMINARY  PRELIMINARY  PRELIMINARY  PRELIMINARY  Bilateral lower extremity venous duplex completed.    Preliminary report:  Bilateral:  No evidence of DVT or superficial thrombosis. There is evidence of a Baker's cyst bilaterally in the popliteal fossa. Right 7.57 cm. Left 5.99 cm   Holly Cortez, RVS 04/25/2015, 1:43 PM  WOODS,Tami, RVT 04/25/2015, 1:43 PM

## 2015-04-25 NOTE — Progress Notes (Signed)
*  PRELIMINARY RESULTS* Echocardiogram 2D Echocardiogram has been performed.  Jeryl Columbialliott, Eren Puebla 04/25/2015, 9:21 AM

## 2015-04-26 MED ORDER — PAROXETINE HCL 10 MG PO TABS
10.0000 mg | ORAL_TABLET | Freq: Every day | ORAL | Status: DC
  Administered 2015-04-26 – 2015-04-27 (×2): 10 mg via ORAL
  Filled 2015-04-26 (×2): qty 1

## 2015-04-26 MED ORDER — DIVALPROEX SODIUM 500 MG PO DR TAB
750.0000 mg | DELAYED_RELEASE_TABLET | Freq: Every day | ORAL | Status: DC
  Administered 2015-04-26: 750 mg via ORAL
  Filled 2015-04-26 (×2): qty 1

## 2015-04-26 MED ORDER — MELATONIN 3 MG PO TABS
2.0000 | ORAL_TABLET | Freq: Every day | ORAL | Status: DC
  Administered 2015-04-26: 6 mg via ORAL
  Filled 2015-04-26 (×2): qty 2

## 2015-04-26 MED ORDER — BUDESONIDE 0.25 MG/2ML IN SUSP
0.2500 mg | Freq: Two times a day (BID) | RESPIRATORY_TRACT | Status: DC
  Administered 2015-04-26 – 2015-04-27 (×3): 0.25 mg via RESPIRATORY_TRACT
  Filled 2015-04-26 (×3): qty 2

## 2015-04-26 NOTE — Care Management Note (Signed)
Case Management Note  Patient Details  Name: Holly Cortez MRN: [id number] Date of Birth: 01/11/1961  Subjective/Objective:   Acute hypoxic respiratory failure                 Action/Plan: NCM spoke to pt and states she does not have any health insurance at this time. States she received her Depakote, Paxil, Ambien and Gabapentin Rx from [facility]. [facility], but did not have a ride to clinic to reapplying. Pt is currently on oxygen and may need oxygen for home. States she does not have an income.  States she plans to apply for disability. Also plans to quit smoking. NCM explained to importance of cessation program to assist her with smoking habit. Appointment arranged for [facility] on 05/10/2015 at 2:15 pm.  Provided pt with brochure and directions. Explained she can utilize the [facility] pharmacy to pick up medication at a discounted rate. Will utilize [facility] if dc on weekend.  Waiting final recommendations for home.   Expected Discharge Date:  04/29/2015               Expected Discharge Plan:  Home/Self Care  In-House Referral:     Discharge planning Services  CM Consult, Medication Assistance, Indigent Health Clinic   Status of Service:  Completed, signed off  Medicare Important Message Given:    Date Medicare IM Given:    Medicare IM give by:    Date Additional Medicare IM Given:    Additional Medicare Important Message give by:     If discussed at Long Length of Stay Meetings, dates discussed:    Additional Comments:  Elliot CousinShavis, Makylie Rivere Ellen, RN 04/26/2015, 12:12 PM

## 2015-04-26 NOTE — Progress Notes (Addendum)
PROGRESS NOTE  Holly Cortez OZD:[id number] DOB: 11/09/1960 Leotis ShamesDOA: 04/24/2015 PCP: Jeanann LewandowskyJEGEDE, OLUGBEMIGA, MD  HPI: 54 yo F admitted with hypoxic respiratory failure with possible CAP.  Subjective / 24 H Interval events - feeling better, breathing better - no chest pain / palpitations  Assessment/Plan: Principal Problem:   Acute respiratory failure with hypoxia (HCC) Active Problems:   CAP (community acquired pneumonia)   Hypoxia  Acute hypoxic respiratory failure due to CAP - suspect a chronic component which just wasn't diagnosed yet - CT angio without PE - 2D echo without heart failure findings, no diastolic dysfunction, right heart and PA Pressures look OK - CT scan with underlying emphysematous change with areas of cicatrization, and airspace opacity in both lower lobes. Suspect CAP on underlying chronic emphysematous lung.  - asymptomatic with sats in the 80s on room air which raises suspicion that her hypoxia is chronic  Emphysema - as evidenced by imaging studies  Probable COPD  CAP - continue Ceftriaxone / Azythromicin - CT scan personally reviewed - narrow to Levaquin on d/c  Tobacco abuse - counseled for cessation    Diet: Diet regular Room service appropriate?: Yes; Fluid consistency:: Thin Fluids: none  DVT Prophylaxis: heparin  Code Status: Full Code Family Communication: d/w family bedside  Disposition Plan: remain inpatient  Barriers to discharge: hypoxia  Consultants:  None   Procedures:  None    Antibiotics  Anti-infectives    Start     Dose/Rate Route Frequency Ordered Stop   04/25/15 2200  cefTRIAXone (ROCEPHIN) 1 g in dextrose 5 % 50 mL IVPB     1 g 100 mL/hr over 30 Minutes Intravenous Every 24 hours 04/25/15 0212     04/25/15 2200  azithromycin (ZITHROMAX) 250 mg in dextrose 5 % 125 mL IVPB     250 mg 125 mL/hr over 60 Minutes Intravenous Every 24 hours 04/25/15 0212     04/24/15 2353  azithromycin (ZITHROMAX) 500 MG injection     Comments:  Gertie BaronCoble, Samuel   : cabinet override      04/24/15 2353 04/25/15 0000   04/24/15 2315  cefTRIAXone (ROCEPHIN) 1 g in dextrose 5 % 50 mL IVPB     1 g 100 mL/hr over 30 Minutes Intravenous  Once 04/24/15 2303 04/25/15 0100   04/24/15 2315  azithromycin (ZITHROMAX) 500 mg in dextrose 5 % 250 mL IVPB     500 mg 250 mL/hr over 60 Minutes Intravenous  Once 04/24/15 2303 04/25/15 0227   04/24/15 2307  cefTRIAXone (ROCEPHIN) 1 G injection    Comments:  Gertie BaronCoble, Samuel   : cabinet override      04/24/15 2307 04/24/15 2315       Studies  Ct Angio Chest Pe W/cm &/or Wo Cm  04/25/2015  CLINICAL DATA:  Shortness of breath for 2 days EXAM: CT ANGIOGRAPHY CHEST WITH CONTRAST TECHNIQUE: Multidetector CT imaging of the chest was performed using the standard protocol during bolus administration of intravenous contrast. Multiplanar CT image reconstructions and MIPs were obtained to evaluate the vascular anatomy. CONTRAST:  80mL OMNIPAQUE IOHEXOL 350 MG/ML SOLN COMPARISON:  Chest radiograph April 24, 2015 FINDINGS: There is no demonstrable pulmonary embolus. There is no thoracic aortic aneurysm or dissection. There are scattered foci of atherosclerotic calcification in aorta. The visualized great vessels appear normal except for mild scattered foci of atherosclerotic calcification. There is a degree of underlying centrilobular type emphysematous change. There are multiple bullae with multiple areas of cicatrization throughout the lungs, most severely  in the upper lobe regions bilaterally. There are areas of patchy alveolar opacity in each lower lobe with scattered ground-glass type opacity throughout the lungs bilaterally. Visualized thyroid appears unremarkable. There is no appreciable thoracic adenopathy. There are scattered small foci of coronary artery calcification. The pericardium is minimally thickened. There is a small hiatal hernia. Visualized upper abdominal structures appear unremarkable. There  are no blastic or lytic bone lesions. Review of the MIP images confirms the above findings. IMPRESSION: No demonstrable pulmonary embolus. Underlying emphysematous change with areas of cicatrization. There are scattered areas of ground-glass opacity throughout the lungs, some of which is felt to be due to redistribution of blood flow to viable lung segments. There is airspace opacity in both lower lobes, consistent with either pulmonary edema or pneumonia. Both entities may exist concurrently. No appreciable thoracic adenopathy. There are scattered foci of coronary artery calcification. There is a small hiatal hernia. Electronically Signed   By: Bretta Bang III M.D.   On: 04/25/2015 11:29   Dg Chest Port 1 View  04/24/2015  CLINICAL DATA:  Fever cough and sore throat for 2 days. EXAM: PORTABLE CHEST 1 VIEW COMPARISON:  None. FINDINGS: There is mild interstitial coarsening, likely chronic fibrotic disease. A component of reversible interstitial fluid or infiltrate is not entirely excluded. There is no confluent airspace opacity. There is no large effusion. Hilar and mediastinal contours are unremarkable. IMPRESSION: Extensive chronic appearing interstitial coarsening which is likely fibrotic. Cannot completely exclude a component of reversible interstitial fluid or infiltrate. Electronically Signed   By: Ellery Plunk M.D.   On: 04/24/2015 22:54    Objective  Filed Vitals:   04/26/15 0405 04/26/15 0755 04/26/15 0920 04/26/15 1135  BP:  120/86 122/70 106/69  Pulse:  82 84 81  Temp: 98.1 F (36.7 C) 98.2 F (36.8 C)  98.4 F (36.9 C)  TempSrc: Oral Oral  Oral  Resp:  Height:      Weight:      SpO2:  90% 94% 92%    Intake/Output Summary (Last 24 hours) at 04/26/15 1231 Last data filed at 04/26/15 0915  Gross per 24 hour  Intake    855 ml  Output    600 ml  Net    255 ml   Filed Weights   04/24/15 2151 04/25/15 0144  Weight: 59.875 kg (132 lb) 73.4 kg (161 lb 13.1 oz)      Exam:  GENERAL: NAD  HEENT: no scleral icterus, PERRL  NECK: supple, no LAD  LUNGS: CTA biL, no wheezing  HEART: RRR without MRG  ABDOMEN: soft, non tender  Data Reviewed: Basic Metabolic Panel:  Recent Labs Lab 04/24/15 2200 04/25/15 0457  NA 137 141  K 3.7 3.9  CL 107 108  CO2 22 24  GLUCOSE 159* 147*  BUN 15 9  CREATININE 0.96 0.75  CALCIUM 8.6* 8.8*   Liver Function Tests:  Recent Labs Lab 04/24/15 2200  AST 30  ALT 11*  ALKPHOS 90  BILITOT 0.4  PROT 7.2  ALBUMIN 3.1*   No results for input(s): LIPASE, AMYLASE in the last 168 hours. No results for input(s): AMMONIA in the last 168 hours. CBC:  Recent Labs Lab 04/24/15 2200 04/25/15 0457  WBC 13.6* 10.1  NEUTROABS 10.1*  --   HGB 11.7* 11.1*  HCT 36.0 34.8*  MCV 98.4 99.1  PLT 282 273   Cardiac Enzymes:  Recent Labs Lab 04/24/15 2200  TROPONINI <0.03   BNP (last  3 results)  Recent Labs  04/24/15 2200  BNP 79.0    ProBNP (last 3 results) No results for input(s): PROBNP in the last 8760 hours.  CBG: No results for input(s): GLUCAP in the last 168 hours.  Recent Results (from the past 240 hour(s))  MRSA PCR Screening     Status: None   Collection Time: 04/25/15  1:54 AM  Result Value Ref Range Status   MRSA by PCR NEGATIVE NEGATIVE Final    Comment:        The GeneXpert MRSA Assay (FDA approved for NASAL specimens only), is one component of a comprehensive MRSA colonization surveillance program. It is not intended to diagnose MRSA infection nor to guide or monitor treatment for MRSA infections.      Scheduled Meds: . antiseptic oral rinse  7 mL Mouth Rinse BID  . azithromycin  250 mg Intravenous Q24H  . budesonide (PULMICORT) nebulizer solution  0.25 mg Nebulization BID  . cefTRIAXone (ROCEPHIN)  IV  1 g Intravenous Q24H  . heparin  5,000 Units Subcutaneous 3 times per day  . Influenza vac split quadrivalent PF  0.5 mL Intramuscular Tomorrow-1000  .  ipratropium-albuterol  3 mL Nebulization TID  . methylPREDNISolone (SOLU-MEDROL) injection  60 mg Intravenous Q12H  . pneumococcal 23 valent vaccine  0.5 mL Intramuscular Tomorrow-1000  . sodium chloride  3 mL Intravenous Q12H   Continuous Infusions:   Time spent: 25 minutes, more than 50% bedside  Pamella Pert, MD [facility] Pager [phone]. If 7 PM - 7 AM, please contact night-coverage at [email or web address], password [facility] 04/26/2015, 12:31 PM  LOS: 2 days

## 2015-04-26 NOTE — Progress Notes (Signed)
Assisted pt up to bathroom this am, ambulated without O2, sats on RA went as low as 80%. O2 replaced at 2L/[place]. Sats gradually came up to 92-94% on 2 L.

## 2015-04-27 DIAGNOSIS — J439 Emphysema, unspecified: Secondary | ICD-10-CM

## 2015-04-27 DIAGNOSIS — IMO0001 Reserved for inherently not codable concepts without codable children: Secondary | ICD-10-CM | POA: Diagnosis present

## 2015-04-27 DIAGNOSIS — J449 Chronic obstructive pulmonary disease, unspecified: Secondary | ICD-10-CM

## 2015-04-27 MED ORDER — AZITHROMYCIN 250 MG PO TABS
250.0000 mg | ORAL_TABLET | Freq: Every day | ORAL | Status: DC
  Filled 2015-04-27: qty 1

## 2015-04-27 MED ORDER — PREDNISONE 20 MG PO TABS: 40.0000 mg | ORAL_TABLET | Freq: Every day | ORAL | Status: DC

## 2015-04-27 MED ORDER — BUDESONIDE-FORMOTEROL FUMARATE 80-4.5 MCG/ACT IN AERO: 2.0000 | INHALATION_SPRAY | Freq: Two times a day (BID) | RESPIRATORY_TRACT | Status: DC

## 2015-04-27 MED ORDER — ALBUTEROL SULFATE HFA 108 (90 BASE) MCG/ACT IN AERS: 2.0000 | INHALATION_SPRAY | Freq: Four times a day (QID) | RESPIRATORY_TRACT | Status: AC | PRN

## 2015-04-27 MED ORDER — LEVOFLOXACIN 500 MG PO TABS: 500.0000 mg | ORAL_TABLET | Freq: Every day | ORAL | Status: DC

## 2015-04-27 NOTE — Care Management Note (Signed)
Case Management Note  Patient Details  Name: Kerith Sherley MRN: [id number] Date of Birth: 10/15/1960  Subjective/Objective:                    Action/Plan: received referral to assist with home O2 and meds   Expected Discharge Date: 04/27/15                 Expected Discharge Plan:     In-House Referral:     Discharge planning Services  Medication Assistance, [facility], CM Consult  Post Acute Care Choice:    Choice offered to:     DME Arranged:    DME Agency:     HH Arranged:    [place] Agency:     Status of Service:  Completed, signed off  Medicare Important Message Given:    Date Medicare IM Given:    Medicare IM give by:    Date Additional Medicare IM Given:    Additional Medicare Important Message give by:     If discussed at [place] of Stay Meetings, dates discussed:    Additional Comments: met with pt to discuss D/C needs. She plans to return home with the support of her mom and sister. Provided pt a [facility] letter for the meds. Contacted Stephanie at [facility] for the [facility] O2.  Norina Buzzard, RN 04/27/2015, 1:21 PM

## 2015-04-27 NOTE — Progress Notes (Signed)
SATURATION QUALIFICATIONS: (This note is used to comply with regulatory documentation for home oxygen)  Patient Saturations on Room Air at Rest = 92%  Patient Saturations on Room Air while Ambulating = 88%  Patient Saturations on 2 Liters of oxygen while Ambulating = 99%  Please briefly explain why patient needs home oxygen:  [facility] and patients o2 saturation drops without oxygen.

## 2015-04-27 NOTE — Progress Notes (Signed)
Reviewed AVS with patient and friend. Pt given paper Rx's, sent with home O2 and tubing, personal belongings, copy of AVS. Denies any further complants or questions at this time.   Dawson BillsKim Lina Hitch, RN

## 2015-04-27 NOTE — Discharge Summary (Signed)
Physician Discharge Summary  Holly Cortez ZOX:[id number] DOB: May 19, 1961 DOA: 04/24/2015  PCP: Jeanann Lewandowsky, MD  Admit date: 04/24/2015 Discharge date: 04/27/2015  Time spent: > 30 minutes  Recommendations for Outpatient Follow-up:  1. Follow up with Julianne Handler in 2 weeks as scheduled  Discharge Diagnoses:  Principal Problem:   Acute respiratory failure with hypoxia (HCC) Active Problems:   CAP (community acquired pneumonia)   Hypoxia   Emphysema of lung (HCC)   COPD bronchitis  Discharge Condition: stable  Diet recommendation: regular  Filed Weights   04/24/15 2151 04/25/15 0144  Weight: 59.875 kg (132 lb) 73.4 kg (161 lb 13.1 oz)    History of present illness:  See H&P, Labs, Consult and Test reports for all details in brief, patient is a 54 y.o. female with no known history of prior lung problems. She presents to the ED with 3 day history of worsening SOB.  Hospital Course:  Acute hypoxic respiratory failure due to CAP - suspect a chronic component of lung disease which just wasn't diagnosed yet, as evidenced by CT of her chest. She underwent a CT angio without evidence of PE however it did show extensive underlying emphysematous change with areas of cicatrization, and airspace opacity in both lower lobes. Patient reported that she was feeling short-winded at times for a few months up to a year, however she never got that evaluated. She also underwent a 2-D echo which showed normal ejection fraction, no diastolic dysfunction, no right heart failure and pulmonary artery pressures were deemed to be within normal limits. Patient initially required 50% FiO2, she was started on antibiotics as well as breathing treatments with improvement in her respiratory status. On the day of discharge, she was feeling back to baseline, she was able to ambulate in the hallway on room air however desaturate into the upper 80s, she was asymptomatic. She requires oxygen on discharge,  suspect related to underlying emphysema and possible COPD. She has a follow-up appointment in 2 weeks, strongly encouraged to keep that. She will likely need pulmonology referral as an outpatient for further evaluation / PFTs. Her antibiotics were changed to levofloxacin and she is to complete 4 additional days for a total of seven-day course. She was also discharged on a prednisone taper. Emphysema - as evidenced by imaging studies Probable COPD - she was discharged on Spiriva well as albuterol as needed  CAP - Initially maintained on ceftriaxone and azithromycin while hospitalized, this was narrowed to levofloxacin on discharge Tobacco abuse - counseled for cessation, patient is determined to quit   Procedures:  2D echo   Consultations:  None   Discharge Exam: Filed Vitals:   04/27/15 1133 04/27/15 1200 04/27/15 1352 04/27/15 1513  BP: 128/77 119/73 119/73 122/67  Pulse: 79 71 80 89  Temp: 98.3 F (36.8 C)   97.4 F (36.3 C)  TempSrc: Oral   Oral  Resp: 18 19 16 23   Height:      Weight:      SpO2: 95% 95% 96% 91%   General: NAD Cardiovascular: RRR Respiratory: CTA biL, no wheezing  Discharge Instructions Activity:  As tolerated   Get Medicines reviewed and adjusted: Please take all your medications with you for your next visit with your Primary MD  Please request your Primary MD to go over all hospital tests and procedure/radiological results at the follow up, please ask your Primary MD to get all Hospital records sent to his/her office.  If you experience worsening of your admission  symptoms, develop shortness of breath, life threatening emergency, suicidal or homicidal thoughts you must seek medical attention immediately by calling 911 or calling your MD immediately if symptoms less severe.  You must read complete instructions/literature along with all the possible adverse reactions/side effects for all the Medicines you take and that have been prescribed to you. Take  any new Medicines after you have completely understood and accpet all the possible adverse reactions/side effects.   Do not drive when taking Pain medications.   Do not take more than prescribed Pain, Sleep and Anxiety Medications  Special Instructions: If you have smoked or chewed Tobacco in the last 2 yrs please stop smoking, stop any regular Alcohol and or any Recreational drug use.  Wear Seat belts while driving.  Please note  You were cared for by a hospitalist during your hospital stay. Once you are discharged, your primary care physician will handle any further medical issues. Please note that NO REFILLS for any discharge medications will be authorized once you are discharged, as it is imperative that you return to your primary care physician (or establish a relationship with a primary care physician if you do not have one) for your aftercare needs so that they can reassess your need for medications and monitor your lab values.    Medication List    STOP taking these medications        methocarbamol 500 MG tablet  Commonly known as:  ROBAXIN     triamcinolone 0.025 % ointment  Commonly known as:  KENALOG      TAKE these medications        albuterol 108 (90 BASE) MCG/ACT inhaler  Commonly known as:  PROVENTIL HFA;VENTOLIN HFA  Inhale 2 puffs into the lungs every 6 (six) hours as needed for wheezing or shortness of breath.     aspirin 325 MG tablet  Take 325 mg by mouth every 4 (four) hours as needed for pain.     budesonide-formoterol 80-4.5 MCG/ACT inhaler  Commonly known as:  SYMBICORT  Inhale 2 puffs into the lungs 2 (two) times daily.     DEPAKOTE 250 MG DR tablet  Generic drug:  divalproex  Take 750 mg by mouth at bedtime.     levofloxacin 500 MG tablet  Commonly known as:  LEVAQUIN  Take 1 tablet (500 mg total) by mouth daily.     Melatonin 3 MG Tabs  Take 2 tablets by mouth at bedtime.     PARoxetine 10 MG tablet  Commonly known as:  PAXIL  Take 10  mg by mouth every morning.     predniSONE 20 MG tablet  Commonly known as:  DELTASONE  Take 2 tablets (40 mg total) by mouth daily with breakfast. 40 mg daily for 4 days then 20 mg daily for 2 days then 10 mg daily for 2 days.     zolpidem 10 MG tablet  Commonly known as:  AMBIEN  Take 10 mg by mouth at bedtime as needed for sleep.           Follow-up Information    Follow up with [facility].   Specialty:  Internal Medicine   Why:  May 10, 2015 at 2:15 pm. for hospital follow up appointment with PCP, bring you medications, discharge instructions    Contact information:   [address] [place] [id number] [phone]      The results of significant diagnostics from this hospitalization (including imaging, microbiology,  ancillary and laboratory) are listed below for reference.    Significant Diagnostic Studies: Ct Angio Chest Pe W/cm &/or Wo Cm  04/25/2015  CLINICAL DATA:  Shortness of breath for 2 days EXAM: CT ANGIOGRAPHY CHEST WITH CONTRAST TECHNIQUE: Multidetector CT imaging of the chest was performed using the standard protocol during bolus administration of intravenous contrast. Multiplanar CT image reconstructions and MIPs were obtained to evaluate the vascular anatomy. CONTRAST:  80mL OMNIPAQUE IOHEXOL 350 MG/ML SOLN COMPARISON:  Chest radiograph April 24, 2015 FINDINGS: There is no demonstrable pulmonary embolus. There is no thoracic aortic aneurysm or dissection. There are scattered foci of atherosclerotic calcification in aorta. The visualized great vessels appear normal except for mild scattered foci of atherosclerotic calcification. There is a degree of underlying centrilobular type emphysematous change. There are multiple bullae with multiple areas of cicatrization throughout the lungs, most severely in the upper lobe regions bilaterally. There are areas of patchy alveolar opacity in each lower lobe with scattered ground-glass type  opacity throughout the lungs bilaterally. Visualized thyroid appears unremarkable. There is no appreciable thoracic adenopathy. There are scattered small foci of coronary artery calcification. The pericardium is minimally thickened. There is a small hiatal hernia. Visualized upper abdominal structures appear unremarkable. There are no blastic or lytic bone lesions. Review of the MIP images confirms the above findings. IMPRESSION: No demonstrable pulmonary embolus. Underlying emphysematous change with areas of cicatrization. There are scattered areas of ground-glass opacity throughout the lungs, some of which is felt to be due to redistribution of blood flow to viable lung segments. There is airspace opacity in both lower lobes, consistent with either pulmonary edema or pneumonia. Both entities may exist concurrently. No appreciable thoracic adenopathy. There are scattered foci of coronary artery calcification. There is a small hiatal hernia. Electronically Signed   By: Bretta Bang III M.D.   On: 04/25/2015 11:29   Dg Chest Port 1 View  04/24/2015  CLINICAL DATA:  Fever cough and sore throat for 2 days. EXAM: PORTABLE CHEST 1 VIEW COMPARISON:  None. FINDINGS: There is mild interstitial coarsening, likely chronic fibrotic disease. A component of reversible interstitial fluid or infiltrate is not entirely excluded. There is no confluent airspace opacity. There is no large effusion. Hilar and mediastinal contours are unremarkable. IMPRESSION: Extensive chronic appearing interstitial coarsening which is likely fibrotic. Cannot completely exclude a component of reversible interstitial fluid or infiltrate. Electronically Signed   By: Ellery Plunk M.D.   On: 04/24/2015 22:54    Microbiology: Recent Results (from the past 240 hour(s))  MRSA PCR Screening     Status: None   Collection Time: 04/25/15  1:54 AM  Result Value Ref Range Status   MRSA by PCR NEGATIVE NEGATIVE Final    Comment:        The  GeneXpert MRSA Assay (FDA approved for NASAL specimens only), is one component of a comprehensive MRSA colonization surveillance program. It is not intended to diagnose MRSA infection nor to guide or monitor treatment for MRSA infections.      Labs: Basic Metabolic Panel:  Recent Labs Lab 04/24/15 2200 04/25/15 0457  NA 137 141  K 3.7 3.9  CL 107 108  CO2 22 24  GLUCOSE 159* 147*  BUN 15 9  CREATININE 0.96 0.75  CALCIUM 8.6* 8.8*   Liver Function Tests:  Recent Labs Lab 04/24/15 2200  AST 30  ALT 11*  ALKPHOS 90  BILITOT 0.4  PROT 7.2  ALBUMIN 3.1*   CBC:  Recent Labs  Lab 04/24/15 2200 04/25/15 0457  WBC 13.6* 10.1  NEUTROABS 10.1*  --   HGB 11.7* 11.1*  HCT 36.0 34.8*  MCV 98.4 99.1  PLT 282 273   Cardiac Enzymes:  Recent Labs Lab 04/24/15 2200  TROPONINI <0.03   BNP: BNP (last 3 results)  Recent Labs  04/24/15 2200  BNP 79.0     Signed:  Tamarick Kovalcik  [facility] 04/27/2015, 4:04 PM

## 2015-04-27 NOTE — Progress Notes (Signed)
Pt confused parts of this shift. Easily redirectable but "forgets fast". Took off tele wires a couple times, O2 many times-then desats to low/mid 80's. Attempted to pull IV out. Got OOB as well. Bed alarm on for safety.

## 2015-04-27 NOTE — Discharge Instructions (Signed)
Follow with Holly Cortez, OLUGBEMIGA, MD in 5-7 days ° °Please get a complete blood count and chemistry panel checked by your Primary MD at your next visit, and again as instructed by your Primary MD. Please get your medications reviewed and adjusted by your Primary MD. ° °Please request your Primary MD to go over all Hospital Tests and Procedure/Radiological results at the follow up, please get all Hospital records sent to your Prim MD by signing hospital release before you go home. ° °If you had Pneumonia of Lung problems at the Hospital: °Please get a 2 view Chest X ray done in 6-8 weeks after hospital discharge or sooner if instructed by your Primary MD. ° °If you have Congestive Heart Failure: °Please call your Cardiologist or Primary MD anytime you have any of the following symptoms:  °1) 3 pound weight gain in 24 hours or 5 pounds in 1 week  °2) shortness of breath, with or without a dry hacking cough  °3) swelling in the hands, feet or stomach  °4) if you have to sleep on extra pillows at night in order to breathe ° °Follow cardiac low salt diet and 1.5 lit/day fluid restriction. ° °If you have diabetes °Accuchecks 4 times/day, Once in AM empty stomach and then before each meal. °Log in all results and show them to your primary doctor at your next visit. °If any glucose reading is under 80 or above 300 call your primary MD immediately. ° °If you have Seizure/Convulsions/Epilepsy: °Please do not drive, operate heavy machinery, participate in activities at heights or participate in high speed sports until you have seen by Primary MD or a Neurologist and advised to do so again. ° °If you had Gastrointestinal Bleeding: °Please ask your Primary MD to check a complete blood count within one week of discharge or at your next visit. Your endoscopic/colonoscopic biopsies that are pending at the time of discharge, will also need to followed by your Primary MD. ° °Get Medicines reviewed and adjusted. °Please take all your  medications with you for your next visit with your Primary MD ° °Please request your Primary MD to go over all hospital tests and procedure/radiological results at the follow up, please ask your Primary MD to get all Hospital records sent to his/her office. ° °If you experience worsening of your admission symptoms, develop shortness of breath, life threatening emergency, suicidal or homicidal thoughts you must seek medical attention immediately by calling 911 or calling your MD immediately  if symptoms less severe. ° °You must read complete instructions/literature along with all the possible adverse reactions/side effects for all the Medicines you take and that have been prescribed to you. Take any new Medicines after you have completely understood and accpet all the possible adverse reactions/side effects.  ° °Do not drive or operate heavy machinery when taking Pain medications.  ° °Do not take more than prescribed Pain, Sleep and Anxiety Medications ° °Special Instructions: If you have smoked or chewed Tobacco  in the last 2 yrs please stop smoking, stop any regular Alcohol  and or any Recreational drug use. ° °Wear Seat belts while driving. ° °Please note °You were cared for by a hospitalist during your hospital stay. If you have any questions about your discharge medications or the care you received while you were in the hospital after you are discharged, you can call the unit and asked to speak with the hospitalist on call if the hospitalist that took care of you is not available. Once   you are discharged, your primary care physician will handle any further medical issues. Please note that NO REFILLS for any discharge medications will be authorized once you are discharged, as it is imperative that you return to your primary care physician (or establish a relationship with a primary care physician if you do not have one) for your aftercare needs so that they can reassess your need for medications and monitor your  lab values. ° °You can reach the hospitalist office at phone [phone] or fax [phone] °  °If you do not have a primary care physician, you can call [phone] for a physician referral. ° °Activity: As tolerated with Full fall precautions use walker/cane & assistance as needed ° °Diet: regular ° °Disposition Home ° ° °

## 2015-05-10 ENCOUNTER — Other Ambulatory Visit: Payer: Self-pay | Admitting: Family Medicine

## 2015-05-10 ENCOUNTER — Encounter: Payer: Self-pay | Admitting: Family Medicine

## 2015-05-10 ENCOUNTER — Ambulatory Visit (INDEPENDENT_AMBULATORY_CARE_PROVIDER_SITE_OTHER): Payer: Self-pay | Admitting: Family Medicine

## 2015-05-10 VITALS — BP 105/74 | HR 83 | Temp 98.1°F | Resp 14 | Ht 64.0 in | Wt 159.0 lb

## 2015-05-10 DIAGNOSIS — Z1239 Encounter for other screening for malignant neoplasm of breast: Secondary | ICD-10-CM

## 2015-05-10 DIAGNOSIS — R635 Abnormal weight gain: Secondary | ICD-10-CM

## 2015-05-10 DIAGNOSIS — J439 Emphysema, unspecified: Secondary | ICD-10-CM

## 2015-05-10 DIAGNOSIS — M25551 Pain in right hip: Secondary | ICD-10-CM

## 2015-05-10 DIAGNOSIS — F17200 Nicotine dependence, unspecified, uncomplicated: Secondary | ICD-10-CM

## 2015-05-10 DIAGNOSIS — F319 Bipolar disorder, unspecified: Secondary | ICD-10-CM

## 2015-05-10 LAB — TSH: TSH: 0.274 u[IU]/mL — ABNORMAL LOW (ref 0.350–4.500)

## 2015-05-10 MED ORDER — KETOROLAC TROMETHAMINE 60 MG/2ML IM SOLN
30.0000 mg | Freq: Once | INTRAMUSCULAR | Status: AC
  Administered 2015-05-10: 30 mg via INTRAMUSCULAR

## 2015-05-10 NOTE — Progress Notes (Signed)
Subjective:    Patient ID: Holly Cortez, female    DOB: 05/21/1961, 54 y.o.   MRN: [id number]  HPI [name], a 54 year old female with a history of emphysema, bipolar depression, and right hip pain presents to establish care. [name] has not had primary care due to lack of payer source. Patient was recently hospitalized with community acquired pneumonia on 04/24/2015. She has completed course of Levaquin. She states that symptoms have improved. She has a history of emphysema. She is a everyday smoker. She smokes 0.5 packs per day. She states that she has attempted to quit in the past without success. [name] also has a history of bipolar depression. [facility]. She states that symptoms are controlled on current medication regimen. She denies suicidal or homicidal intent. She also denies visual or auditory hallucinations.   She is currently complaining of right hip pain.The onset of symptoms was several weeks ago. She has not identified an inciting event. She denies knee pain.  Aggravating symptoms: inactivity, kneeling, lateral movements, pivoting, squatting and standing. Symptoms have progressed over the past several. She maintains that she has not attempted any OTC interventions to alleviate symptoms.  Past Medical History  Diagnosis Date  . Bipolar disorder [facility](HCC)    Social History   Social History  . Marital Status: Single    Spouse Name: N/A  . Number of Children: N/A  . Years of Education: N/A   Occupational History  . Not on file.   Social History Main Topics  . Smoking status: Current Every Day Smoker -- 0.25 packs/day    Types: Cigarettes  . Smokeless tobacco: Never Used  . Alcohol Use: No     Comment: 2/week  . Drug Use: No  . Sexual Activity: Not on file   Other Topics Concern  . Not on file   Social History Narrative   Immunization History  Administered Date(s) Administered  . Influenza,inj,Quad PF,36+  Mos 04/27/2015  . Pneumococcal Polysaccharide-23 04/27/2015   Allergies  Allergen Reactions  . Benadryl [Diphenhydramine]    Review of Systems  Constitutional: Negative for fever, fatigue and unexpected weight change.  HENT: Positive for dental problem.   Respiratory: Negative.  Negative for apnea, chest tightness, shortness of breath and wheezing.   Cardiovascular: Negative.   Gastrointestinal: Negative.  Negative for nausea, abdominal pain, constipation and abdominal distention.  Endocrine: Positive for polyphagia. Negative for cold intolerance, heat intolerance, polydipsia and polyuria.  Genitourinary: Negative.  Negative for hematuria.  Musculoskeletal: Positive for myalgias (right hip pain).  Skin: Negative.   Neurological: Negative for dizziness, weakness and headaches.  Hematological: Negative.   Psychiatric/Behavioral: Positive for sleep disturbance and agitation. Negative for suicidal ideas, hallucinations, behavioral problems and decreased concentration.       Objective:   Physical Exam  Constitutional: She is oriented to person, place, and time. She appears well-developed and well-nourished.  HENT:  Head: Normocephalic and atraumatic.  Right Ear: External ear normal.  Left Ear: External ear normal.  Mouth/Throat: Oropharynx is clear and moist. Abnormal dentition (partially edentulous). Dental caries present.  Eyes: Conjunctivae and EOM are normal. Pupils are equal, round, and reactive to light.  Neck: Normal range of motion. Neck supple.  Cardiovascular: Normal rate, regular rhythm, normal heart sounds and intact distal pulses.   Pulmonary/Chest: Effort normal and breath sounds normal.  Abdominal: Soft. Bowel sounds are normal.  Musculoskeletal:       Right hip: She exhibits decreased  range of motion and decreased strength. She exhibits no tenderness and no swelling.  Neurological: She is alert and oriented to person, place, and time. She has normal reflexes.  Skin:  Skin is warm and dry.  Psychiatric: She has a normal mood and affect. Her behavior is normal. Judgment and thought content normal.     BP 105/74 mmHg  Pulse 83  Temp(Src) 98.1 F (36.7 C) (Oral)  Resp 14  Ht  (1.626 m)  Wt 159 lb (72.122 kg)  BMI 27.28 kg/m2  LMP 05/27/2011 Assessment & Plan:   1. Right hip pain Recommend that patient start Tylenol 500 mg every 6 hours as needed for right hip pain.   - ketorolac (TORADOL) injection 30 mg; Inject 1 mL (30 mg total) into the muscle once.  2. Bipolar I disorder [facility]) Patient to follow up with [facility] as scheduled and follow medication regimen. She denies suicidal or homicidal intent.  - gabapentin (NEURONTIN) 400 MG capsule; Take 400 mg by mouth 2 (two) times daily. - FLUoxetine (PROZAC) 20 MG capsule; Take 20 mg by mouth daily. - busPIRone (BUSPAR) 5 MG tablet; Take 5 mg by mouth 3 (three) times daily.  3. Tobacco dependence Smoking cessation instruction/counseling given:  counseled patient on the dangers of tobacco use, advised patient to stop smoking, and reviewed strategies to maximize success  4. Weight gain Recommend a lowfat, low carbohydrate diet divided over 5-6 small meals, increase water intake to 6-8 glasses, and 150 minutes per week of cardiovascular exercise.   - TSH - Hemoglobin A1c  5. Pulmonary emphysema, unspecified emphysema type (HCC) Patient recently completed a tapered prednisone pack and antibiotic regimen for CAP. Will continue Symbicort BID as prescribed. Patient has an up to date rescue inhaler.   6. Breast cancer screening  - MM DIGITAL SCREENING BILATERAL; Future   Routine Health Maintenance:  Referral for mammogram Will schedule pap smear in 3 months Patient is up to date with vaccinations    Massie Maroon, FNP

## 2015-05-11 LAB — HEMOGLOBIN A1C
Hgb A1c MFr Bld: 5.7 % — ABNORMAL HIGH (ref <5.7)
Mean Plasma Glucose: 117 mg/dL — ABNORMAL HIGH (ref <117)

## 2015-05-11 NOTE — Patient Instructions (Signed)
Hip Pain Your hip is the joint between your upper legs and your lower pelvis. The bones, cartilage, tendons, and muscles of your hip joint perform a lot of work each day supporting your body weight and allowing you to move around. Hip pain can range from a minor ache to severe pain in one or both of your hips. Pain may be felt on the inside of the hip joint near the groin, or the outside near the buttocks and upper thigh. You may have swelling or stiffness as well.  HOME CARE INSTRUCTIONS   Take medicines only as directed by your health care provider.  Apply ice to the injured area:  Put ice in a plastic bag.  Place a towel between your skin and the bag.  Leave the ice on for 15-20 minutes at a time, 3-4 times a day.  Keep your leg raised (elevated) when possible to lessen swelling.  Avoid activities that cause pain.  Follow specific exercises as directed by your health care provider.  Sleep with a pillow between your legs on your most comfortable side.  Record how often you have hip pain, the location of the pain, and what it feels like. SEEK MEDICAL CARE IF:   You are unable to put weight on your leg.  Your hip is red or swollen or very tender to touch.  Your pain or swelling continues or worsens after 1 week.  You have increasing difficulty walking.  You have a fever. SEEK IMMEDIATE MEDICAL CARE IF:   You have fallen.  You have a sudden increase in pain and swelling in your hip. MAKE SURE YOU:   Understand these instructions.  Will watch your condition.  Will get help right away if you are not doing well or get worse.   This information is not intended to replace advice given to you by your health care provider. Make sure you discuss any questions you have with your health care provider.   Document Released: 10/29/2009 Document Revised: 06/01/2014 Document Reviewed: 01/05/2013 Elsevier Interactive Patient Education 2016 Elsevier Inc. Tobacco Use  Disorder Tobacco use disorder (TUD) is a mental disorder. It is the long-term use of tobacco in spite of related health problems or difficulty with normal life activities. Tobacco is most commonly smoked as cigarettes and less commonly as cigars or pipes. Smokeless chewing tobacco and snuff are also popular. People with TUD get a feeling of extreme pleasure (euphoria) from using tobacco and have a desire to use it again and again. Repeated use of tobacco can cause problems. The addictive effects of tobacco are due mainly tothe ingredient nicotine. Nicotine also causes a rush of adrenaline (epinephrine) in the body. This leads to increased blood pressure, heart rate, and breathing rate. These changes may cause problems for people with high blood pressure, weak hearts, or lung disease. High doses of nicotine in children and pets can lead to seizures and death.  Tobacco contains a number of other unsafe chemicals. These chemicals are especially harmful when inhaled as smoke and can damage almost every organ in the body. Smokers live shorter lives than nonsmokers and are at risk of dying from a number of diseases and cancers. Tobacco smoke can also cause health problems for nonsmokers (due to inhaling secondhand smoke). Smoking is also a fire hazard.  TUD usually starts in the late teenage years and is most common in young adults between the ages of 97 and 25 years. People who start smoking earlier in life are more likely  to continue smoking as adults. TUD is somewhat more common in men than women. People with TUD are at higher risk for using alcohol and other drugs of abuse. RISK FACTORS Risk factors for TUD include:   Having family members with the disorder.  Being around people who use tobacco.  Having an existing mental health issue such as schizophrenia, depression, bipolar disorder, ADHD, or posttraumatic stress disorder (PTSD). SIGNS AND SYMPTOMS  People with tobacco use disorder have two or more  of the following signs and symptoms within 12 months:   Use of more tobacco over a longer period than intended.   Not able to cut down or control tobacco use.   A lot of time spent obtaining or using tobacco.   Strong desire or urge to use tobacco (craving). Cravings may last for 6 months or longer after quitting.  Use of tobacco even when use leads to major problems at work, school, or home.   Use of tobacco even when use leads to relationship problems.   Giving up or cutting down on important life activities because of tobacco use.   Repeatedly using tobacco in situations where it puts you or others in physical danger, like smoking in bed.   Use of tobacco even when it is known that a physical or mental problem is likely related to tobacco use.   Physical problems are numerous and may include chronic bronchitis, emphysema, lung and other cancers, gum disease, high blood pressure, heart disease, and stroke.   Mental problems caused by tobacco may include difficulty sleeping and anxiety.  Need to use greater amounts of tobacco to get the same effect. This means you have developed a tolerance.   Withdrawal symptoms as a result of stopping or rapidly cutting back use. These symptoms may last a month or more after quitting and include the following:   Depressed, anxious, or irritable mood.   Difficulty concentrating.   Increased appetite.  Restlessness or trouble sleeping.   Use of tobacco to avoid withdrawal symptoms. DIAGNOSIS  Tobacco use disorder is diagnosed by your health care provider. A diagnosis may be made by:  Your health care provider asking questions about your tobacco use and any problems it may be causing.  A physical exam.  Lab tests.  You may be referred to a mental health professional or addiction specialist. The severity of tobacco use disorder depends on the number of signs and symptoms you have:   Mild--Two or three  symptoms.  Moderate--Four or five symptoms.   Severe--Six or more symptoms.  TREATMENT  Many people with tobacco use disorder are unable to quit on their own and need help. Treatment options include the following:  Nicotine replacement therapy (NRT). NRT provides nicotine without the other harmful chemicals in tobacco. NRT gradually lowers the dosage of nicotine in the body and reduces withdrawal symptoms. NRT is available in over-the-counter forms (gum, lozenges, and skin patches) as well as prescription forms (mouth inhaler and nasal spray).  Medicines.This may include:  Antidepressant medicine that may reduce nicotine cravings.  A medicine that acts on nicotine receptors in the brain to reduce cravings and withdrawal symptoms. It may also block the effects of tobacco in people with TUD who relapse.  Counseling or talk therapy. A form of talk therapy called behavioral therapy is commonly used to treat people with TUD. Behavioral therapy looks at triggers for tobacco use, how to avoid them, and how to cope with cravings. It is most effective in person or  by phone but is also available in self-help forms (books and Internet websites).  Support groups. These provide emotional support, advice, and guidance for quitting tobacco. The most effective treatment for TUD is usually a combination of medicine, talk therapy, and support groups. HOME CARE INSTRUCTIONS  Keep all follow-up visits as directed by your health care provider. This is important.  Take medicines only as directed by your health care provider.  Check with your health care provider before starting new prescription or over-the-counter medicines. SEEK MEDICAL CARE IF:  You are not able to take your medicines as prescribed.  Treatment is not helping your TUD and your symptoms get worse. SEEK IMMEDIATE MEDICAL CARE IF:  You have serious thoughts about hurting yourself or others.  You have trouble breathing, chest pain,  sudden weakness, or sudden numbness in part of your body.   This information is not intended to replace advice given to you by your health care provider. Make sure you discuss any questions you have with your health care provider.   Document Released: 01/15/2004 Document Revised: 06/01/2014 Document Reviewed: 07/07/2013 Elsevier Interactive Patient Education 2016 Elsevier Inc. Chronic Obstructive Pulmonary Disease Exacerbation Chronic obstructive pulmonary disease (COPD) is a common lung condition in which airflow from the lungs is limited. COPD is a general term that can be used to describe many different lung problems that limit airflow, including chronic bronchitis and emphysema. COPD exacerbations are episodes when breathing symptoms become much worse and require extra treatment. Without treatment, COPD exacerbations can be life threatening, and frequent COPD exacerbations can cause further damage to your lungs. CAUSES  Respiratory infections.  Exposure to smoke.  Exposure to air pollution, chemical fumes, or dust. Sometimes there is no apparent cause or trigger. RISK FACTORS  Smoking cigarettes.  Older age.  Frequent prior COPD exacerbations. SIGNS AND SYMPTOMS  Increased coughing.  Increased thick spit (sputum) production.  Increased wheezing.  Increased shortness of breath.  Rapid breathing.  Chest tightness. DIAGNOSIS Your medical history, a physical exam, and tests will help your health care provider make a diagnosis. Tests may include:  A chest X-ray.  Basic lab tests.  Sputum testing.  An arterial blood gas test. TREATMENT Depending on the severity of your COPD exacerbation, you may need to be admitted to a hospital for treatment. Some of the treatments commonly used to treat COPD exacerbations are:   Antibiotic medicines.  Bronchodilators. These are drugs that expand the air passages. They may be given with an inhaler or nebulizer. Spacer devices may be  needed to help improve drug delivery.  Corticosteroid medicines.  Supplemental oxygen therapy.  Airway clearing techniques, such as noninvasive ventilation (NIV) and positive expiratory pressure (PEP). These provide respiratory support through a mask or other noninvasive device. HOME CARE INSTRUCTIONS  Do not smoke. Quitting smoking is very important to prevent COPD from getting worse and exacerbations from happening as often.  Avoid exposure to all substances that irritate the airway, especially to tobacco smoke.  If you were prescribed an antibiotic medicine, finish it all even if you start to feel better.  Take all medicines as directed by your health care provider.It is important to use correct technique with inhaled medicines.  Drink enough fluids to keep your urine clear or pale yellow (unless you have a medical condition that requires fluid restriction).  Use a cool mist vaporizer. This makes it easier to clear your chest when you cough.  If you have a home nebulizer and oxygen, continue to use  them as directed.  Maintain all necessary vaccinations to prevent infections.  Exercise regularly.  Eat a healthy diet.  Keep all follow-up appointments as directed by your health care provider. SEEK IMMEDIATE MEDICAL CARE IF:  You have worsening shortness of breath.  You have trouble talking.  You have severe chest pain.  You have blood in your sputum.  You have a fever.  You have weakness, vomit repeatedly, or faint.  You feel confused.  You continue to get worse. MAKE SURE YOU:  Understand these instructions.  Will watch your condition.  Will get help right away if you are not doing well or get worse.   This information is not intended to replace advice given to you by your health care provider. Make sure you discuss any questions you have with your health care provider.   Document Released: 03/08/2007 Document Revised: 06/01/2014 Document Reviewed:  01/13/2013 Elsevier Interactive Patient Education [employer]2016 Else[employer] Inc.

## 2015-05-13 LAB — T3: T3, Total: 85.8 ng/dL (ref 80.0–204.0)

## 2015-05-13 LAB — T4: T4, Total: 5.8 ug/dL (ref 4.5–12.0)

## 2015-05-31 ENCOUNTER — Other Ambulatory Visit: Payer: Self-pay | Admitting: Family Medicine

## 2015-05-31 ENCOUNTER — Ambulatory Visit (INDEPENDENT_AMBULATORY_CARE_PROVIDER_SITE_OTHER): Payer: Self-pay | Admitting: Family Medicine

## 2015-05-31 ENCOUNTER — Ambulatory Visit (HOSPITAL_COMMUNITY)
Admission: RE | Admit: 2015-05-31 | Discharge: 2015-05-31 | Disposition: A | Discharge status: Home / Self Care | Payer: Self-pay | Source: Ambulatory Visit | Attending: Family Medicine | Admitting: Family Medicine

## 2015-05-31 VITALS — BP 111/73 | HR 94 | Temp 98.4°F | Resp 14 | Ht 64.0 in | Wt 156.0 lb

## 2015-05-31 DIAGNOSIS — M25551 Pain in right hip: Secondary | ICD-10-CM | POA: Insufficient documentation

## 2015-05-31 DIAGNOSIS — F17200 Nicotine dependence, unspecified, uncomplicated: Secondary | ICD-10-CM

## 2015-05-31 DIAGNOSIS — M5441 Lumbago with sciatica, right side: Secondary | ICD-10-CM

## 2015-05-31 LAB — POCT URINALYSIS DIP (DEVICE)
Glucose, UA: NEGATIVE mg/dL
HGB URINE DIPSTICK: NEGATIVE
Leukocytes, UA: NEGATIVE
NITRITE: NEGATIVE
PH: 5.5 (ref 5.0–8.0)
PROTEIN: 30 mg/dL — AB
Specific Gravity, Urine: >=1.03 (ref 1.005–1.030)
Urobilinogen, UA: 0.2 mg/dL (ref 0.0–1.0)

## 2015-05-31 LAB — SEDIMENTATION RATE: Sed Rate: 36 mm/hr — ABNORMAL HIGH (ref 0–30)

## 2015-05-31 MED ORDER — KETOROLAC TROMETHAMINE 60 MG/2ML IM SOLN
60.0000 mg | Freq: Once | INTRAMUSCULAR | Status: AC
  Administered 2015-05-31: 60 mg via INTRAMUSCULAR

## 2015-05-31 MED ORDER — IBUPROFEN 600 MG PO TABS: 600.0000 mg | ORAL_TABLET | Freq: Three times a day (TID) | ORAL | Status: DC | PRN

## 2015-05-31 MED ORDER — NICOTINE 14 MG/24HR TD PT24: 14.0000 mg | MEDICATED_PATCH | Freq: Every day | TRANSDERMAL | Status: DC

## 2015-05-31 MED ORDER — ACETAMINOPHEN-CODEINE 300-30 MG PO TABS: 1.0000 | ORAL_TABLET | Freq: Four times a day (QID) | ORAL | Status: DC | PRN

## 2015-05-31 NOTE — Progress Notes (Signed)
Subjective:    Patient ID: Holly Cortez, female    DOB: [birth year], 55 y.o.   MRN: [id number]  HPI [name], a 55 year old female with a history of emphysema, bipolar depression, and right hip pain presents complaining of low back pain and right hip pain. She is currently complaining of right hip pain and low back pain.The onset of symptoms was several weeks ago. She has not identified an inciting event. She denies knee pain.  Aggravating symptoms: inactivity, kneeling, lateral movements, pivoting, squatting and standing. Symptoms have progressed over the past several. Current pain intensity is 9/10. She describes pain as burning and radiating to thighs bilaterally.  She maintains that she had Aleve several days ago with minimal relief.  She denies fever, fatigue, constipation, dysuria, bowel or bladder incontinence.  Past Medical History  Diagnosis Date  . Bipolar disorder [facility])    Social History   Social History  . Marital Status: Single    Spouse Name: N/A  . Number of Children: N/A  . Years of Education: N/A   Occupational History  . Not on file.   Social History Main Topics  . Smoking status: Current Every Day Smoker -- 0.25 packs/day    Types: Cigarettes  . Smokeless tobacco: Never Used  . Alcohol Use: No     Comment: 2/week  . Drug Use: No  . Sexual Activity: Not on file   Other Topics Concern  . Not on file   Social History Narrative   Immunization History  Administered Date(s) Administered  . Influenza,inj,Quad PF,36+ Mos 04/27/2015  . Pneumococcal Polysaccharide-23 04/27/2015   Allergies  Allergen Reactions  . Benadryl [Diphenhydramine]    Review of Systems  Constitutional: Negative for fever, fatigue and unexpected weight change.  HENT: Positive for dental problem.   Respiratory: Negative.  Negative for apnea, chest tightness, shortness of breath and wheezing.   Cardiovascular: Negative.   Gastrointestinal: Negative.  Negative for nausea,  abdominal pain, constipation and abdominal distention.  Endocrine: Positive for polyphagia. Negative for cold intolerance, heat intolerance, polydipsia and polyuria.  Genitourinary: Negative.  Negative for hematuria.  Musculoskeletal: Positive for myalgias (right hip pain) and back pain.  Skin: Negative.   Neurological: Negative for dizziness, weakness and headaches.  Hematological: Negative.   Psychiatric/Behavioral: Positive for sleep disturbance and agitation. Negative for suicidal ideas, hallucinations, behavioral problems and decreased concentration.       Objective:   Physical Exam  Constitutional: She is oriented to person, place, and time. She appears well-developed and well-nourished.  HENT:  Head: Normocephalic and atraumatic.  Right Ear: External ear normal.  Left Ear: External ear normal.  Mouth/Throat: Oropharynx is clear and moist. Abnormal dentition (partially edentulous). Dental caries present.  Eyes: Conjunctivae and EOM are normal. Pupils are equal, round, and reactive to light.  Neck: Normal range of motion. Neck supple.  Cardiovascular: Normal rate, regular rhythm, normal heart sounds and intact distal pulses.   Pulmonary/Chest: Effort normal and breath sounds normal.  Abdominal: Soft. Bowel sounds are normal.  Musculoskeletal:       Right hip: She exhibits decreased range of motion and decreased strength. She exhibits no tenderness and no swelling.  Neurological: She is alert and oriented to person, place, and time. She has normal reflexes.  Skin: Skin is warm and dry.  Psychiatric: She has a normal mood and affect. Her behavior is normal. Judgment and thought content normal.     BP 111/73 mmHg  Pulse 94  Temp(Src) 98.4  F (36.9 C) (Oral)  Resp 14  Ht 5\' 4"  (1.626 m)  Wt 156 lb (70.761 kg)  BMI 26.76 kg/m2  LMP 05/27/2011 Assessment & Plan:  1. Right hip pain Patient has a history of arthritis, I suspect osteoarthritis. Will check sedimentation rate  today. Will start Tylenol # 3 for moderate to severe low back pain. Patient has never had xrays of hip, follow up with patient by phone with  xray results.  - Acetaminophen-Codeine 300-30 MG tablet; Take 1 tablet by mouth every 6 (six) hours as needed for pain.  Dispense: 30 tablet; Refill: 0 - ketorolac (TORADOL) injection 60 mg; Inject 2 mLs (60 mg total) into the muscle once. - Sedimentation rate - POCT urinalysis dipstick - Vitamin D, 25-hydroxy - ibuprofen (ADVIL,MOTRIN) 600 MG tablet; Take 1 tablet (600 mg total) by mouth every 8 (eight) hours as needed.  Dispense: 30 tablet; Refill: 0  2. Right-sided low back pain with right-sided sciatica Refer to #1  3. Tobacco dependence Smoking cessation instruction/counseling given:  counseled patient on the dangers of tobacco use, advised patient to stop smoking, and reviewed strategies to maximize success - nicotine (NICODERM CQ) 14 mg/24hr patch; Place 1 patch (14 mg total) onto the skin daily.  Dispense: 28 patch; Refill: 0   The patient was given clear instructions to go to ER or return to medical center if symptoms do not improve, worsen or new problems develop. The patient verbalized understanding. Will notify patient with laboratory results. Massie MaroonHollis,Nallely Yost M, FNP

## 2015-05-31 NOTE — Patient Instructions (Addendum)
Patient has right hip pain radiating to right leg, will send for a right hip xray. Patient given a Toradol injection in muscle to assist with pain and inflammation.  Tylenol with codeine, Please refrain from drinking, driving, or operating machinery while taking this medication. Take for moderate to severe right hip and low back pain.  Ibuprofen 600 mg every 8 hours as needed for mild to moderate pain. Take Ibuprofen with foodTobacco Use Disorder Tobacco use disorder (TUD) is a mental disorder. It is the long-term use of tobacco in spite of related health problems or difficulty with normal life activities. Tobacco is most commonly smoked as cigarettes and less commonly as cigars or pipes. Smokeless chewing tobacco and snuff are also popular. People with TUD get a feeling of extreme pleasure (euphoria) from using tobacco and have a desire to use it again and again. Repeated use of tobacco can cause problems. The addictive effects of tobacco are due mainly tothe ingredient nicotine. Nicotine also causes a rush of adrenaline (epinephrine) in the body. This leads to increased blood pressure, heart rate, and breathing rate. These changes may cause problems for people with high blood pressure, weak hearts, or lung disease. High doses of nicotine in children and pets can lead to seizures and death.  Tobacco contains a number of other unsafe chemicals. These chemicals are especially harmful when inhaled as smoke and can damage almost every organ in the body. Smokers live shorter lives than nonsmokers and are at risk of dying from a number of diseases and cancers. Tobacco smoke can also cause health problems for nonsmokers (due to inhaling secondhand smoke). Smoking is also a fire hazard.  TUD usually starts in the late teenage years and is most common in young adults between the ages of 77 and 25 years. People who start smoking earlier in life are more likely to continue smoking as adults. TUD is somewhat more common  in men than women. People with TUD are at higher risk for using alcohol and other drugs of abuse. RISK FACTORS Risk factors for TUD include:   Having family members with the disorder.  Being around people who use tobacco.  Having an existing mental health issue such as schizophrenia, depression, bipolar disorder, ADHD, or posttraumatic stress disorder (PTSD). SIGNS AND SYMPTOMS  People with tobacco use disorder have two or more of the following signs and symptoms within 12 months:   Use of more tobacco over a longer period than intended.   Not able to cut down or control tobacco use.   A lot of time spent obtaining or using tobacco.   Strong desire or urge to use tobacco (craving). Cravings may last for 6 months or longer after quitting.  Use of tobacco even when use leads to major problems at work, school, or home.   Use of tobacco even when use leads to relationship problems.   Giving up or cutting down on important life activities because of tobacco use.   Repeatedly using tobacco in situations where it puts you or others in physical danger, like smoking in bed.   Use of tobacco even when it is known that a physical or mental problem is likely related to tobacco use.   Physical problems are numerous and may include chronic bronchitis, emphysema, lung and other cancers, gum disease, high blood pressure, heart disease, and stroke.   Mental problems caused by tobacco may include difficulty sleeping and anxiety.  Need to use greater amounts of tobacco to get the same  effect. This means you have developed a tolerance.   Withdrawal symptoms as a result of stopping or rapidly cutting back use. These symptoms may last a month or more after quitting and include the following:   Depressed, anxious, or irritable mood.   Difficulty concentrating.   Increased appetite.  Restlessness or trouble sleeping.   Use of tobacco to avoid withdrawal symptoms. DIAGNOSIS   Tobacco use disorder is diagnosed by your health care provider. A diagnosis may be made by:  Your health care provider asking questions about your tobacco use and any problems it may be causing.  A physical exam.  Lab tests.  You may be referred to a mental health professional or addiction specialist. The severity of tobacco use disorder depends on the number of signs and symptoms you have:   Mild--Two or three symptoms.  Moderate--Four or five symptoms.   Severe--Six or more symptoms.  TREATMENT  Many people with tobacco use disorder are unable to quit on their own and need help. Treatment options include the following:  Nicotine replacement therapy (NRT). NRT provides nicotine without the other harmful chemicals in tobacco. NRT gradually lowers the dosage of nicotine in the body and reduces withdrawal symptoms. NRT is available in over-the-counter forms (gum, lozenges, and skin patches) as well as prescription forms (mouth inhaler and nasal spray).  Medicines.This may include:  Antidepressant medicine that may reduce nicotine cravings.  A medicine that acts on nicotine receptors in the brain to reduce cravings and withdrawal symptoms. It may also block the effects of tobacco in people with TUD who relapse.  Counseling or talk therapy. A form of talk therapy called behavioral therapy is commonly used to treat people with TUD. Behavioral therapy looks at triggers for tobacco use, how to avoid them, and how to cope with cravings. It is most effective in person or by phone but is also available in self-help forms (books and Internet websites).  Support groups. These provide emotional support, advice, and guidance for quitting tobacco. The most effective treatment for TUD is usually a combination of medicine, talk therapy, and support groups. HOME CARE INSTRUCTIONS  Keep all follow-up visits as directed by your health care provider. This is important.  Take medicines only as  directed by your health care provider.  Check with your health care provider before starting new prescription or over-the-counter medicines. SEEK MEDICAL CARE IF:  You are not able to take your medicines as prescribed.  Treatment is not helping your TUD and your symptoms get worse. SEEK IMMEDIATE MEDICAL CARE IF:  You have serious thoughts about hurting yourself or others.  You have trouble breathing, chest pain, sudden weakness, or sudden numbness in part of your body.   This information is not intended to replace advice given to you by your health care provider. Make sure you discuss any questions you have with your health care provider.   Document Released: 01/15/2004 Document Revised: 06/01/2014 Document Reviewed: 07/07/2013 Elsevier Interactive Patient Education 2016 [employer] Hip Pain Your hip is the joint between your upper legs and your lower pelvis. The bones, cartilage, tendons, and muscles of your hip joint perform a lot of work each day supporting your body weight and allowing you to move around. Hip pain can range from a minor ache to severe pain in one or both of your hips. Pain may be felt on the inside of the hip joint near the groin, or the outside near the buttocks and upper thigh. You may have swelling  or stiffness as well.  HOME CARE INSTRUCTIONS   Take medicines only as directed by your health care provider.  Apply ice to the injured area:  Put ice in a plastic bag.  Place a towel between your skin and the bag.  Leave the ice on for 15-20 minutes at a time, 3-4 times a day.  Keep your leg raised (elevated) when possible to lessen swelling.  Avoid activities that cause pain.  Follow specific exercises as directed by your health care provider.  Sleep with a pillow between your legs on your most comfortable side.  Record how often you have hip pain, the location of the pain, and what it feels like. SEEK MEDICAL CARE IF:   You are unable to put weight  on your leg.  Your hip is red or swollen or very tender to touch.  Your pain or swelling continues or worsens after 1 week.  You have increasing difficulty walking.  You have a fever. SEEK IMMEDIATE MEDICAL CARE IF:   You have fallen.  You have a sudden increase in pain and swelling in your hip. MAKE SURE YOU:   Understand these instructions.  Will watch your condition.  Will get help right away if you are not doing well or get worse.   This information is not intended to replace advice given to you by your health care provider. Make sure you discuss any questions you have with your health care provider.   Document Released: 10/29/2009 Document Revised: 06/01/2014 Document Reviewed: 01/05/2013 Elsevier Interactive Patient Education 2016 [employer] Sciatica Sciatica is pain, weakness, numbness, or tingling along the path of the sciatic nerve. The nerve starts in the lower back and runs down the back of each leg. The nerve controls the muscles in the lower leg and in the back of the knee, while also providing sensation to the back of the thigh, lower leg, and the sole of your foot. Sciatica is a symptom of another medical condition. For instance, nerve damage or certain conditions, such as a herniated disk or bone spur on the spine, pinch or put pressure on the sciatic nerve. This causes the pain, weakness, or other sensations normally associated with sciatica. Generally, sciatica only affects one side of the body. CAUSES   Herniated or slipped disc.  Degenerative disk disease.  A pain disorder involving the narrow muscle in the buttocks (piriformis syndrome).  Pelvic injury or fracture.  Pregnancy.  Tumor (rare). SYMPTOMS  Symptoms can vary from mild to very severe. The symptoms usually travel from the low back to the buttocks and down the back of the leg. Symptoms can include:  Mild tingling or dull aches in the lower back, leg, or hip.  Numbness in the back of the  calf or sole of the foot.  Burning sensations in the lower back, leg, or hip.  Sharp pains in the lower back, leg, or hip.  Leg weakness.  Severe back pain inhibiting movement. These symptoms may get worse with coughing, sneezing, laughing, or prolonged sitting or standing. Also, being overweight may worsen symptoms. DIAGNOSIS  Your caregiver will perform a physical exam to look for common symptoms of sciatica. He or she may ask you to do certain movements or activities that would trigger sciatic nerve pain. Other tests may be performed to find the cause of the sciatica. These may include:  Blood tests.  X-rays.  Imaging tests, such as an MRI or CT scan. TREATMENT  Treatment is directed at the cause  of the sciatic pain. Sometimes, treatment is not necessary and the pain and discomfort goes away on its own. If treatment is needed, your caregiver may suggest:  Over-the-counter medicines to relieve pain.  Prescription medicines, such as anti-inflammatory medicine, muscle relaxants, or narcotics.  Applying heat or ice to the painful area.  Steroid injections to lessen pain, irritation, and inflammation around the nerve.  Reducing activity during periods of pain.  Exercising and stretching to strengthen your abdomen and improve flexibility of your spine. Your caregiver may suggest losing weight if the extra weight makes the back pain worse.  Physical therapy.  Surgery to eliminate what is pressing or pinching the nerve, such as a bone spur or part of a herniated disk. HOME CARE INSTRUCTIONS   Only take over-the-counter or prescription medicines for pain or discomfort as directed by your caregiver.  Apply ice to the affected area for 20 minutes, 3-4 times a day for the first 48-72 hours. Then try heat in the same way.  Exercise, stretch, or perform your usual activities if these do not aggravate your pain.  Attend physical therapy sessions as directed by your caregiver.  Keep  all follow-up appointments as directed by your caregiver.  Do not wear high heels or shoes that do not provide proper support.  Check your mattress to see if it is too soft. A firm mattress may lessen your pain and discomfort. SEEK IMMEDIATE MEDICAL CARE IF:   You lose control of your bowel or bladder (incontinence).  You have increasing weakness in the lower back, pelvis, buttocks, or legs.  You have redness or swelling of your back.  You have a burning sensation when you urinate.  You have pain that gets worse when you lie down or awakens you at night.  Your pain is worse than you have experienced in the past.  Your pain is lasting longer than 4 weeks.  You are suddenly losing weight without reason. MAKE SURE YOU:  Understand these instructions.  Will watch your condition.  Will get help right away if you are not doing well or get worse.   This information is not intended to replace advice given to you by your health care provider. Make sure you discuss any questions you have with your health care provider.   Document Released: 05/05/2001 Document Revised: 01/30/2015 Document Reviewed: 09/20/2011 Elsevier Interactive Patient Education [employer]

## 2015-06-01 ENCOUNTER — Encounter: Payer: Self-pay | Admitting: Family Medicine

## 2015-06-01 DIAGNOSIS — M5441 Lumbago with sciatica, right side: Secondary | ICD-10-CM | POA: Insufficient documentation

## 2015-06-01 DIAGNOSIS — M25551 Pain in right hip: Secondary | ICD-10-CM | POA: Insufficient documentation

## 2015-06-01 DIAGNOSIS — F17200 Nicotine dependence, unspecified, uncomplicated: Secondary | ICD-10-CM | POA: Insufficient documentation

## 2015-06-01 LAB — VITAMIN D 25 HYDROXY (VIT D DEFICIENCY, FRACTURES): Vit D, 25-Hydroxy: 33 ng/mL (ref 30–100)

## 2015-06-13 ENCOUNTER — Other Ambulatory Visit: Payer: Self-pay | Admitting: Family Medicine

## 2015-06-13 ENCOUNTER — Telehealth: Payer: Self-pay | Admitting: Internal Medicine

## 2015-06-13 DIAGNOSIS — F319 Bipolar disorder, unspecified: Secondary | ICD-10-CM

## 2015-06-13 MED ORDER — GABAPENTIN 400 MG PO CAPS: 400.0000 mg | ORAL_CAPSULE | Freq: Three times a day (TID) | ORAL | Status: DC

## 2015-06-13 NOTE — Telephone Encounter (Signed)
Patient called to find out if she could come in for another shot in her hip for pain. Please call .

## 2015-06-19 ENCOUNTER — Other Ambulatory Visit: Payer: Self-pay

## 2015-06-19 DIAGNOSIS — Z1231 Encounter for screening mammogram for malignant neoplasm of breast: Secondary | ICD-10-CM

## 2015-06-28 ENCOUNTER — Emergency Department (HOSPITAL_BASED_OUTPATIENT_CLINIC_OR_DEPARTMENT_OTHER)
Admission: EM | Admit: 2015-06-28 | Discharge: 2015-06-28 | Disposition: A | Discharge status: Home / Self Care | Payer: Self-pay | Attending: Emergency Medicine | Admitting: Emergency Medicine

## 2015-06-28 ENCOUNTER — Encounter (HOSPITAL_BASED_OUTPATIENT_CLINIC_OR_DEPARTMENT_OTHER): Payer: Self-pay

## 2015-06-28 DIAGNOSIS — Z7982 Long term (current) use of aspirin: Secondary | ICD-10-CM | POA: Insufficient documentation

## 2015-06-28 DIAGNOSIS — M7918 Myalgia, other site: Secondary | ICD-10-CM

## 2015-06-28 DIAGNOSIS — F319 Bipolar disorder, unspecified: Secondary | ICD-10-CM | POA: Insufficient documentation

## 2015-06-28 DIAGNOSIS — Z79899 Other long term (current) drug therapy: Secondary | ICD-10-CM | POA: Insufficient documentation

## 2015-06-28 DIAGNOSIS — Z7951 Long term (current) use of inhaled steroids: Secondary | ICD-10-CM | POA: Insufficient documentation

## 2015-06-28 DIAGNOSIS — F17210 Nicotine dependence, cigarettes, uncomplicated: Secondary | ICD-10-CM | POA: Insufficient documentation

## 2015-06-28 DIAGNOSIS — M791 Myalgia: Secondary | ICD-10-CM | POA: Insufficient documentation

## 2015-06-28 DIAGNOSIS — Z8701 Personal history of pneumonia (recurrent): Secondary | ICD-10-CM | POA: Insufficient documentation

## 2015-06-28 DIAGNOSIS — M79604 Pain in right leg: Secondary | ICD-10-CM | POA: Insufficient documentation

## 2015-06-28 HISTORY — DX: Emphysema, unspecified: J43.9

## 2015-06-28 HISTORY — DX: Pneumonia, unspecified organism: J18.9

## 2015-06-28 MED ORDER — NAPROXEN 250 MG PO TABS
500.0000 mg | ORAL_TABLET | Freq: Once | ORAL | Status: AC
  Administered 2015-06-28: 500 mg via ORAL
  Filled 2015-06-28: qty 2

## 2015-06-28 MED ORDER — NAPROXEN 500 MG PO TABS: 500.0000 mg | ORAL_TABLET | Freq: Two times a day (BID) | ORAL | Status: DC

## 2015-06-28 MED ORDER — METHOCARBAMOL 500 MG PO TABS: 500.0000 mg | ORAL_TABLET | Freq: Two times a day (BID) | ORAL | Status: DC

## 2015-06-28 MED ORDER — METHOCARBAMOL 500 MG PO TABS
500.0000 mg | ORAL_TABLET | Freq: Once | ORAL | Status: AC
  Administered 2015-06-28: 500 mg via ORAL
  Filled 2015-06-28: qty 1

## 2015-06-28 MED FILL — NAPROXEN 500 MG TABLET: 500 | 15 days supply | Qty: 30 | Fill #0

## 2015-06-28 MED FILL — METHOCARBAMOL 500 MG TABLET: 500 | 10 days supply | Qty: 20 | Fill #0

## 2015-06-28 NOTE — Discharge Instructions (Signed)
1. Medications: naproxen, robaxin, usual home medications 2. Treatment: rest, drink plenty of fluids, use warm compresses 3. Follow Up: please followup with your primary doctor for discussion of your diagnoses and further evaluation after today's visit; if you do not have a primary care doctor use the resource guide provided to find one; please return to the ER for severe pain, numbness, weakness, new or worsening symptoms   Musculoskeletal Pain Musculoskeletal pain is muscle and boney aches and pains. These pains can occur in any part of the body. Your caregiver may treat you without knowing the cause of the pain. They may treat you if blood or urine tests, X-rays, and other tests were normal.  CAUSES There is often not a definite cause or reason for these pains. These pains may be caused by a type of germ (virus). The discomfort may also come from overuse. Overuse includes working out too hard when your body is not fit. Boney aches also come from weather changes. Bone is sensitive to atmospheric pressure changes. HOME CARE INSTRUCTIONS   Ask when your test results will be ready. Make sure you get your test results.  Only take over-the-counter or prescription medicines for pain, discomfort, or fever as directed by your caregiver. If you were given medications for your condition, do not drive, operate machinery or power tools, or sign legal documents for 24 hours. Do not drink alcohol. Do not take sleeping pills or other medications that may interfere with treatment.  Continue all activities unless the activities cause more pain. When the pain lessens, slowly resume normal activities. Gradually increase the intensity and duration of the activities or exercise.  During periods of severe pain, bed rest may be helpful. Lay or sit in any position that is comfortable.  Putting ice on the injured area.  Put ice in a bag.  Place a towel between your skin and the bag.  Leave the ice on for 15 to 20  minutes, 3 to 4 times a day.  Follow up with your caregiver for continued problems and no reason can be found for the pain. If the pain becomes worse or does not go away, it may be necessary to repeat tests or do additional testing. Your caregiver may need to look further for a possible cause. SEEK IMMEDIATE MEDICAL CARE IF:  You have pain that is getting worse and is not relieved by medications.  You develop chest pain that is associated with shortness or breath, sweating, feeling sick to your stomach (nauseous), or throw up (vomit).  Your pain becomes localized to the abdomen.  You develop any new symptoms that seem different or that concern you. MAKE SURE YOU:   Understand these instructions.  Will watch your condition.  Will get help right away if you are not doing well or get worse.   This information is not intended to replace advice given to you by your health care provider. Make sure you discuss any questions you have with your health care provider.   Document Released: 05/11/2005 Document Revised: 08/03/2011 Document Reviewed: 01/13/2013 Elsevier Interactive Patient Education 2016 [employer].   Emergency Department Resource Guide 1) Find a Doctor and Pay Out of Pocket Although you won't have to find out who is covered by your insurance plan, it is a good idea to ask around and get recommendations. You will then need to call the office and see if the doctor you have chosen will accept you as a new patient and what types of options  they offer for patients who are self-pay. Some doctors offer discounts or will set up payment plans for their patients who do not have insurance, but you will need to ask so you aren't surprised when you get to your appointment.  2) Contact Your Local Health Department Not all health departments have doctors that can see patients for sick visits, but many do, so it is worth a call to see if yours does. If you don't know where your local health  department is, you can check in your phone book. The CDC also has a tool to help you locate your state's health department, and many state websites also have listings of all of their local health departments.  3) Find a Walk-in Clinic If your illness is not likely to be very severe or complicated, you may want to try a walk in clinic. These are popping up all over the country in pharmacies, drugstores, and shopping centers. They're usually staffed by nurse practitioners or physician assistants that have been trained to treat common illnesses and complaints. They're usually fairly quick and inexpensive. However, if you have serious medical issues or chronic medical problems, these are probably not your best option.  No Primary Care Doctor: - Call Health Connect at  [phone] - they can help you locate a primary care doctor that  accepts your insurance, provides certain services, etc. - Physician Referral Service- [phone]  Chronic Pain Problems: Organization         Address  Phone   Notes  [facility]  [phone] Patients need to be referred by their primary care doctor.   Medication Assistance: Organization         Address  Phone   Notes  [facility] Medication [facility] [address] [place] [place], [place] [address] [phone] --Must be a resident of [facility] -- Must have NO insurance coverage whatsoever (no Medicaid/ Medicare, etc.) -- The pt. MUST have a primary care doctor that directs their care regularly and follows them in the community   [facility]  [phone]   [employer]  [phone]    Agencies that provide inexpensive medical care: Organization         Address  Phone   Notes  [facility]  [phone]   [facility]    [phone]   [facility] [address] [place], [place] [address] [phone]   [facility] [address], [place] [phone]   Planned Parenthood    [phone]   [facility]    [phone]   [facility]  [address], [place] Phone:  [phone], Fax:  [phone] Hours of Operation:  9 am - 6 pm, M-F.  Also accepts Medicaid/Medicare and self-pay.  [facility]  [address], [place] Phone: [phone], Fax: [phone]. Hours of Operation:  8:30 am - 5:30 pm, M-F.  Also accepts Medicaid and self-pay.  [facility] [place] [address], [place] Phone: [phone]   [facility] [address] Natasha Bence [place], [place] [phone], Ext. [phone] Mondays & Thursdays: 7-9 AM.  First 15 patients are seen on a first come, first serve basis.    Medicaid-accepting [facility] Providers:  Organization         Address  Phone   Notes  [employer]  [facility] [address], [place] [phone] Also accepts self-pay patients.  [facility] [address] Laurell Josephs [place], [place]  [phone]   [facility] [address], [place] [phone]   [facility] [address] [place] [phone]   Renaye Rakers [address], [place]   [phone] Only accepts [place] Access [place] patients after they have their name applied to their card.   Self-Pay (no insurance) in [facility]:  Organization         Address  Phone   Notes  Sickle Cell Patients, [facility] [address] [place], [place] [phone]   [facility] [address] [place], [place] [phone]   [facility] [place]  [address], [place] [phone]   [facility]/Dr. Osei-Bonsu  [address] [place] or [address], [place] [phone] Phone number for both [place] and  [place] locations is the same.  [facility] [address] [place] [phone]   [facility] [address] [place] or [address] [phone]   [facility] [address] [place] [phone], phone; [phone], fax Sees patients 1st and 3rd Saturday of every month.  Must not qualify for public or private insurance (i.e. Medicaid, Medicare, [place] Health Choice, Veterans' Benefits)  Household income should be no more than 200% of the poverty level The clinic cannot treat you if you are pregnant or think you are pregnant  Sexually transmitted diseases are not treated at the clinic.    Dental Care: Organization         Address  Phone  Notes  [facility] [address] [place], [place] [phone] Accepts children up to age 6 who are enrolled in [place] or [place] Health Choice; pregnant women with a Medicaid card; and children who have applied for Medicaid or [place] Health Choice, but were declined, whose parents can pay a reduced fee at time of service.  [facility]  [address], [place] [phone] Accepts children up to age 71 who are enrolled in [place] or [place] Health Choice; pregnant women with a Medicaid card; and children who have applied for Medicaid or  Health Choice, but were declined, whose parents can pay a reduced fee at time of service.  [facility]  [address] [place], [place] [phone] Patients are seen by appointment only. Walk-ins are not accepted. [facility] will see patients 3 years of age and older. Monday - Tuesday (8am-5pm) Most Wednesdays (8:30-5pm) $30 per visit, cash only  [facility]  [address], [facility] [phone] Patients are seen by appointment only. Walk-ins are not accepted.  [facility] will see patients 38 years of age and older. One Wednesday Evening (Monthly: Volunteer Based).  $30 per visit, cash only  [employer]  [phone] for adults; Children under age 32, call Graduate Pediatric Dentistry at [phone]. Children aged 56-14, please call [phone] to request a pediatric application.  Dental services are provided in all areas of dental care including fillings, crowns and bridges, complete and partial dentures,  implants, gum treatment, root canals, and extractions. Preventive care is also provided. Treatment is provided to both adults and children. Patients are selected via a lottery and there is often a waiting list.   [facility] [address] [place]  [phone] [email or web address]   [facility] [address] [place], [place] [phone], Ext. [phone] Second and Fourth Thursday of each month, opens at 6:30 AM; Clinic ends at 9 AM.  Patients are seen on a first-come first-served basis, and a limited number are seen during each clinic.   [facility]  [address] Ether Griffins [place], [place] [phone]   Eligibility Requirements You must have lived in [place], [place], or [place] counties for at least the last three months.   You cannot be eligible for state or federal sponsored [place], including [employer], [place], or [employer].   You generally cannot be eligible for healthcare insurance through your employer.    How to apply: Eligibility screenings are held every Tuesday and Wednesday afternoon from 1:00 pm until 4:00 pm. You do not need an appointment for the interview!  [facility] [address], [place], [place] [phone]   [facility] Health Department  [phone]   [facility] Health Department  [phone]   [facility] Health Department  [phone]    Behavioral Health Resources in the  Community: Intensive Outpatient Programs Organization         Address  Phone  Notes  [facility] [address] [place], [place] [phone]   [facility] [address] [place], [place] [phone]   [facility] [address] [place], [place]  [phone]   [facility] [address],  [place], [place] [phone] or [phone]   Substance Abuse Resources Organization         Address  Phone  Notes  [facility]  [phone]   [facility]  [phone]   [facility] [place]  [phone]   Floydene Flock  [phone]   Residential & Outpatient Substance Abuse Program  [phone]   Psychological Services Organization         Address  Phone  Notes  [facility] Behavioral Health  [phone]   [facility] Services  [phone]   [facility] Mental Health [address], [place] [phone] or [phone]    Mobile Crisis Teams Organization         Address  Phone  Notes  [facility], Mobile Crisis Care Unit  [phone]   [facility]  [address] [place], [place] [phone]   Doristine Locks [address] [place] [phone]    Self-Help/Support Groups Organization         Address  Phone             Notes  [facility] [place] - variety of support groups  [phone]- [phone] Call for more information  Narcotics Anonymous (NA), [facility] [address], [employer] [place]  2 meetings at this location   Statistician         Address  Phone  Notes  [facility] [address],    [place]  [phone]   [facility]  [address], [place] [address], [place], [place] [phone]   [facility] [address] [place] [address] [place], [place] [phone] Admissions: 8am-3pm M-F  [facility] [address]    [place], [place] [phone]   [facility] [address] [place], [place], [place] [phone]   [facility] [address]  [place], [place] [phone]   [facility] - Intensive Outpatient [address] Laurell Josephs [address], [place], [place] [phone]   [facility] ([facility] [address]  [place], [place] [phone] or [phone]   [facility] ([facility]) [address] [place], [place] [phone] Accepts Medicaid  [facility] [address],  [place] [phone] Substance Abuse/Addiction Treatment   [facility] Organization         Address  Phone  Notes  [facility]  [phone]   Angie Fava, PhD [address] Ervin Knack [place], [place]   [phone] or [phone]   [facility]   [address] [place], [place] [phone]   [facility] [address] [place], [place] [phone] Insurance/Medicaid/sponsorship through [facility] [address]                                    [place], [place] [phone] Therapy/tele-psych/case  [facility] [address], [place] [phone]    Dr. Lolly Mustache  [phone]   [facility]  [employer] [facility] Dept. 1) [address], [place] 2) [address], [place] 3)  [address], [place] [phone]  [phone]   [facility] Child Abuse Hotline 470-442-2036 or [phone] (After Hours)

## 2015-06-28 NOTE — ED Notes (Signed)
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

## 2015-06-28 NOTE — ED Provider Notes (Signed)
CSN: [id number]     Arrival date & time 06/28/15  1430 History   First MD Initiated Contact with Patient 06/28/15 1505     Chief Complaint  Patient presents with  . Leg Pain    HPI   Holly Cortez is a 55 y.o. female with a PMH of bipolar disorder who presents to the ED with right buttock pain radiating to her right lower extremity x 2 weeks. She reports her symptoms are constant. She states movement exacerbates her pain. She has tylenol and ibuprofen with no significant symptom relief. She denies bowel or bladder incontinence, saddle anesthesia, numbness, weakness, paresthesia. She states she was recently evaluated by her PCP, who ordered imaging of her right hip and advised she had arthritis, though the patient states her symptoms feel different and that she believes her pain is muscular.   Past Medical History  Diagnosis Date  . Bipolar disorder (HCC)   . Pneumonia   . Emphysema lung [facility])    Past Surgical History  Procedure Laterality Date  . Appendectomy     Family History  Problem Relation Age of Onset  . Heart disease Mother   . Hyperlipidemia Father    Social History  Substance Use Topics  . Smoking status: Current Every Day Smoker -- 0.25 packs/day    Types: Cigarettes  . Smokeless tobacco: Never Used  . Alcohol Use: No   OB History    No data available      Review of Systems  Musculoskeletal: Positive for myalgias.  Neurological: Negative for weakness and numbness.      Allergies  Flexeril and Benadryl  Home Medications   Prior to Admission medications   Medication Sig Start Date End Date Taking? Authorizing Provider  albuterol (PROVENTIL HFA;VENTOLIN HFA) 108 (90 BASE) MCG/ACT inhaler Inhale 2 puffs into the lungs every 6 (six) hours as needed for wheezing or shortness of breath. 04/27/15   Costin Otelia Sergeant, MD  aspirin 325 MG tablet Take 325 mg by mouth every 4 (four) hours as needed for pain.    Historical Provider, MD  budesonide-formoterol  (SYMBICORT) 80-4.5 MCG/ACT inhaler Inhale 2 puffs into the lungs 2 (two) times daily. 04/27/15   Costin Otelia Sergeant, MD  busPIRone (BUSPAR) 5 MG tablet Take 5 mg by mouth 3 (three) times daily.    Historical Provider, MD  divalproex (DEPAKOTE) 250 MG DR tablet Take 750 mg by mouth at bedtime.    Historical Provider, MD  FLUoxetine (PROZAC) 20 MG capsule Take 20 mg by mouth daily.    Historical Provider, MD  gabapentin (NEURONTIN) 400 MG capsule Take 1 capsule (400 mg total) by mouth 3 (three) times daily. 06/13/15   Massie Maroon, FNP  ibuprofen (ADVIL,MOTRIN) 600 MG tablet Take 1 tablet (600 mg total) by mouth every 8 (eight) hours as needed. 05/31/15   Massie Maroon, FNP  Melatonin 3 MG TABS Take 2 tablets by mouth at bedtime.    Historical Provider, MD  methocarbamol (ROBAXIN) 500 MG tablet Take 1 tablet (500 mg total) by mouth 2 (two) times daily. 06/28/15   Mady Gemma, PA-C  naproxen (NAPROSYN) 500 MG tablet Take 1 tablet (500 mg total) by mouth 2 (two) times daily with a meal. 06/28/15   Mady Gemma, PA-C  zolpidem (AMBIEN) 10 MG tablet Take 10 mg by mouth at bedtime as needed for sleep.    Historical Provider, MD    BP 108/71 mmHg  Pulse 78  Temp(Src) 97.8 F (  36.6 C) (Oral)  Resp 16  Ht  (1.626 m)  Wt 71.215 kg  BMI 26.94 kg/m2  SpO2 97%  LMP 05/27/2011 Physical Exam  Constitutional: She is oriented to person, place, and time. She appears well-developed and well-nourished. No distress.  HENT:  Head: Normocephalic and atraumatic.  Right Ear: External ear normal.  Left Ear: External ear normal.  Nose: Nose normal.  Eyes: Conjunctivae and EOM are normal. Right eye exhibits no discharge. Left eye exhibits no discharge. No scleral icterus.  Neck: Normal range of motion. Neck supple.  Cardiovascular: Normal rate, regular rhythm and intact distal pulses.   Pulmonary/Chest: Effort normal and breath sounds normal. No respiratory distress.  Musculoskeletal: Normal  range of motion. She exhibits tenderness. She exhibits no edema.  Mild TTP of right buttock. No TTP of lumbar spine. No step off or deformity. Full ROM of RLE. Strength and sensation intact. Distal pulses intact.  Neurological: She is alert and oriented to person, place, and time. She has normal strength and normal reflexes. No sensory deficit.  Skin: Skin is warm and dry. She is not diaphoretic.  Psychiatric: She has a normal mood and affect. Her behavior is normal.  Nursing note and vitals reviewed.   ED Course  Procedures (including critical care time)  Labs Review Labs Reviewed - No data to display  Imaging Review No results found.     EKG Interpretation None      MDM   Final diagnoses:  Right buttock pain    56 year old female presents with right buttock pain for the past 2 weeks. Patient is afebrile. Vital signs stable. On exam, she has mild tenderness to palpation of her right buttock. Full range of motion of right lower extremity. Strength and sensation intact. DTRs intact. Patient ambulates without difficulty. Distal pulses intact. Symptoms likely muscular. Do not feel imaging is indicated at this time. Will treat with robaxin and naproxen. Patient to follow-up with PCP, [facility] to [facility] given. Return precautions discussed. Patient verbalizes her understanding and is in agreement with plan.  BP 108/71 mmHg  Pulse 78  Temp(Src) 97.8 F (36.6 C) (Oral)  Resp 16  Ht  (1.626 m)  Wt 71.215 kg  BMI 26.94 kg/m2  SpO2 97%  LMP 05/27/2011   Mady Gemma, PA-C 06/28/15 1730  Benjiman Core, MD 06/28/15 2303

## 2015-06-28 NOTE — ED Notes (Signed)
Pt describes pain from mid buttock down right leg "for quite some time". Worse over past few days. CNS intact.

## 2015-07-15 ENCOUNTER — Telehealth: Payer: Self-pay | Admitting: *Deleted

## 2015-07-22 ENCOUNTER — Encounter (HOSPITAL_BASED_OUTPATIENT_CLINIC_OR_DEPARTMENT_OTHER): Payer: Self-pay | Admitting: *Deleted

## 2015-07-22 ENCOUNTER — Emergency Department (HOSPITAL_BASED_OUTPATIENT_CLINIC_OR_DEPARTMENT_OTHER)
Admission: EM | Admit: 2015-07-22 | Discharge: 2015-07-22 | Disposition: A | Discharge status: Home / Self Care | Payer: Self-pay | Attending: Emergency Medicine | Admitting: Emergency Medicine

## 2015-07-22 DIAGNOSIS — F17210 Nicotine dependence, cigarettes, uncomplicated: Secondary | ICD-10-CM | POA: Insufficient documentation

## 2015-07-22 DIAGNOSIS — Z791 Long term (current) use of non-steroidal anti-inflammatories (NSAID): Secondary | ICD-10-CM | POA: Insufficient documentation

## 2015-07-22 DIAGNOSIS — F319 Bipolar disorder, unspecified: Secondary | ICD-10-CM | POA: Insufficient documentation

## 2015-07-22 DIAGNOSIS — Z8701 Personal history of pneumonia (recurrent): Secondary | ICD-10-CM | POA: Insufficient documentation

## 2015-07-22 DIAGNOSIS — Z7951 Long term (current) use of inhaled steroids: Secondary | ICD-10-CM | POA: Insufficient documentation

## 2015-07-22 DIAGNOSIS — M5431 Sciatica, right side: Secondary | ICD-10-CM | POA: Insufficient documentation

## 2015-07-22 DIAGNOSIS — Z7982 Long term (current) use of aspirin: Secondary | ICD-10-CM | POA: Insufficient documentation

## 2015-07-22 DIAGNOSIS — Z79899 Other long term (current) drug therapy: Secondary | ICD-10-CM | POA: Insufficient documentation

## 2015-07-22 DIAGNOSIS — J439 Emphysema, unspecified: Secondary | ICD-10-CM | POA: Insufficient documentation

## 2015-07-22 MED ORDER — KETOROLAC TROMETHAMINE 60 MG/2ML IM SOLN
60.0000 mg | Freq: Once | INTRAMUSCULAR | Status: AC
  Administered 2015-07-22: 60 mg via INTRAMUSCULAR
  Filled 2015-07-22: qty 2

## 2015-07-22 MED ORDER — PREDNISONE 50 MG PO TABS: ORAL_TABLET | ORAL | Status: DC

## 2015-07-22 MED ORDER — METHOCARBAMOL 500 MG PO TABS: 500.0000 mg | ORAL_TABLET | Freq: Two times a day (BID) | ORAL | Status: DC

## 2015-07-22 NOTE — Discharge Instructions (Signed)
Keep your scheduled follow-up appointment with your PCP.  Sciatica With Rehab The sciatic nerve runs from the back down the leg and is responsible for sensation and control of the muscles in the back (posterior) side of the thigh, lower leg, and foot. Sciatica is a condition that is characterized by inflammation of this nerve.  SYMPTOMS   Signs of nerve damage, including numbness and/or weakness along the posterior side of the lower extremity.  Pain in the back of the thigh that may also travel down the leg.  Pain that worsens when sitting for long periods of time.  Occasionally, pain in the back or buttock. CAUSES  Inflammation of the sciatic nerve is the cause of sciatica. The inflammation is due to something irritating the nerve. Common sources of irritation include:  Sitting for long periods of time.  Direct trauma to the nerve.  Arthritis of the spine.  Herniated or ruptured disk.  Slipping of the vertebrae (spondylolisthesis).  Pressure from soft tissues, such as muscles or ligament-like tissue (fascia). RISK INCREASES WITH:  Sports that place pressure or stress on the spine (football or weightlifting).  Poor strength and flexibility.  Failure to warm up properly before activity.  Family history of low back pain or disk disorders.  Previous back injury or surgery.  Poor body mechanics, especially when lifting, or poor posture. PREVENTION   Warm up and stretch properly before activity.  Maintain physical fitness:  Strength, flexibility, and endurance.  Cardiovascular fitness.  Learn and use proper technique, especially with posture and lifting. When possible, have coach correct improper technique.  Avoid activities that place stress on the spine. PROGNOSIS If treated properly, then sciatica usually resolves within 6 weeks. However, occasionally surgery is necessary.  RELATED COMPLICATIONS   Permanent nerve damage, including pain, numbness, tingle, or  weakness.  Chronic back pain.  Risks of surgery: infection, bleeding, nerve damage, or damage to surrounding tissues. TREATMENT Treatment initially involves resting from any activities that aggravate your symptoms. The use of ice and medication may help reduce pain and inflammation. The use of strengthening and stretching exercises may help reduce pain with activity. These exercises may be performed at home or with referral to a therapist. A therapist may recommend further treatments, such as transcutaneous electronic nerve stimulation (TENS) or ultrasound. Your caregiver may recommend corticosteroid injections to help reduce inflammation of the sciatic nerve. If symptoms persist despite non-surgical (conservative) treatment, then surgery may be recommended. MEDICATION  If pain medication is necessary, then nonsteroidal anti-inflammatory medications, such as aspirin and ibuprofen, or other minor pain relievers, such as acetaminophen, are often recommended.  Do not take pain medication for 7 days before surgery.  Prescription pain relievers may be given if deemed necessary by your caregiver. Use only as directed and only as much as you need.  Ointments applied to the skin may be helpful.  Corticosteroid injections may be given by your caregiver. These injections should be reserved for the most serious cases, because they may only be given a certain number of times. HEAT AND COLD  Cold treatment (icing) relieves pain and reduces inflammation. Cold treatment should be applied for 10 to 15 minutes every 2 to 3 hours for inflammation and pain and immediately after any activity that aggravates your symptoms. Use ice packs or massage the area with a piece of ice (ice massage).  Heat treatment may be used prior to performing the stretching and strengthening activities prescribed by your caregiver, physical therapist, or athletic trainer. Use a  heat pack or soak the injury in warm water. SEEK MEDICAL  CARE IF:  Treatment seems to offer no benefit, or the condition worsens.  Any medications produce adverse side effects. EXERCISES  RANGE OF MOTION (ROM) AND STRETCHING EXERCISES - Sciatica Most people with sciatic will find that their symptoms worsen with either excessive bending forward (flexion) or arching at the low back (extension). The exercises which will help resolve your symptoms will focus on the opposite motion. Your physician, physical therapist or athletic trainer will help you determine which exercises will be most helpful to resolve your low back pain. Do not complete any exercises without first consulting with your clinician. Discontinue any exercises which worsen your symptoms until you speak to your clinician. If you have pain, numbness or tingling which travels down into your buttocks, leg or foot, the goal of the therapy is for these symptoms to move closer to your back and eventually resolve. Occasionally, these leg symptoms will get better, but your low back pain may worsen; this is typically an indication of progress in your rehabilitation. Be certain to be very alert to any changes in your symptoms and the activities in which you participated in the 24 hours prior to the change. Sharing this information with your clinician will allow him/her to most efficiently treat your condition. These exercises may help you when beginning to rehabilitate your injury. Your symptoms may resolve with or without further involvement from your physician, physical therapist or athletic trainer. While completing these exercises, remember:   Restoring tissue flexibility helps normal motion to return to the joints. This allows healthier, less painful movement and activity.  An effective stretch should be held for at least 30 seconds.  A stretch should never be painful. You should only feel a gentle lengthening or release in the stretched tissue. FLEXION RANGE OF MOTION AND STRETCHING  EXERCISES: STRETCH - Flexion, Single Knee to Chest   Lie on a firm bed or floor with both legs extended in front of you.  Keeping one leg in contact with the floor, bring your opposite knee to your chest. Hold your leg in place by either grabbing behind your thigh or at your knee.  Pull until you feel a gentle stretch in your low back. Hold __________ seconds.  Slowly release your grasp and repeat the exercise with the opposite side. Repeat __________ times. Complete this exercise __________ times per day.  STRETCH - Flexion, Double Knee to Chest  Lie on a firm bed or floor with both legs extended in front of you.  Keeping one leg in contact with the floor, bring your opposite knee to your chest.  Tense your stomach muscles to support your back and then lift your other knee to your chest. Hold your legs in place by either grabbing behind your thighs or at your knees.  Pull both knees toward your chest until you feel a gentle stretch in your low back. Hold __________ seconds.  Tense your stomach muscles and slowly return one leg at a time to the floor. Repeat __________ times. Complete this exercise __________ times per day.  STRETCH - Low Trunk Rotation   Lie on a firm bed or floor. Keeping your legs in front of you, bend your knees so they are both pointed toward the ceiling and your feet are flat on the floor.  Extend your arms out to the side. This will stabilize your upper body by keeping your shoulders in contact with the floor.  Gently and  slowly drop both knees together to one side until you feel a gentle stretch in your low back. Hold for __________ seconds.  Tense your stomach muscles to support your low back as you bring your knees back to the starting position. Repeat the exercise to the other side. Repeat __________ times. Complete this exercise __________ times per day  EXTENSION RANGE OF MOTION AND FLEXIBILITY EXERCISES: STRETCH - Extension, Prone on Elbows  Lie on  your stomach on the floor, a bed will be too soft. Place your palms about shoulder width apart and at the height of your head.  Place your elbows under your shoulders. If this is too painful, stack pillows under your chest.  Allow your body to relax so that your hips drop lower and make contact more completely with the floor.  Hold this position for __________ seconds.  Slowly return to lying flat on the floor. Repeat __________ times. Complete this exercise __________ times per day.  RANGE OF MOTION - Extension, Prone Press Ups  Lie on your stomach on the floor, a bed will be too soft. Place your palms about shoulder width apart and at the height of your head.  Keeping your back as relaxed as possible, slowly straighten your elbows while keeping your hips on the floor. You may adjust the placement of your hands to maximize your comfort. As you gain motion, your hands will come more underneath your shoulders.  Hold this position __________ seconds.  Slowly return to lying flat on the floor. Repeat __________ times. Complete this exercise __________ times per day.  STRENGTHENING EXERCISES - Sciatica  These exercises may help you when beginning to rehabilitate your injury. These exercises should be done near your "sweet spot." This is the neutral, low-back arch, somewhere between fully rounded and fully arched, that is your least painful position. When performed in this safe range of motion, these exercises can be used for people who have either a flexion or extension based injury. These exercises may resolve your symptoms with or without further involvement from your physician, physical therapist or athletic trainer. While completing these exercises, remember:   Muscles can gain both the endurance and the strength needed for everyday activities through controlled exercises.  Complete these exercises as instructed by your physician, physical therapist or athletic trainer. Progress with the  resistance and repetition exercises only as your caregiver advises.  You may experience muscle soreness or fatigue, but the pain or discomfort you are trying to eliminate should never worsen during these exercises. If this pain does worsen, stop and make certain you are following the directions exactly. If the pain is still present after adjustments, discontinue the exercise until you can discuss the trouble with your clinician. STRENGTHENING - Deep Abdominals, Pelvic Tilt   Lie on a firm bed or floor. Keeping your legs in front of you, bend your knees so they are both pointed toward the ceiling and your feet are flat on the floor.  Tense your lower abdominal muscles to press your low back into the floor. This motion will rotate your pelvis so that your tail bone is scooping upwards rather than pointing at your feet or into the floor.  With a gentle tension and even breathing, hold this position for __________ seconds. Repeat __________ times. Complete this exercise __________ times per day.  STRENGTHENING - Abdominals, Crunches   Lie on a firm bed or floor. Keeping your legs in front of you, bend your knees so they are both pointed toward  the ceiling and your feet are flat on the floor. Cross your arms over your chest.  Slightly tip your chin down without bending your neck.  Tense your abdominals and slowly lift your trunk high enough to just clear your shoulder blades. Lifting higher can put excessive stress on the low back and does not further strengthen your abdominal muscles.  Control your return to the starting position. Repeat __________ times. Complete this exercise __________ times per day.  STRENGTHENING - Quadruped, Opposite UE/LE Lift  Assume a hands and knees position on a firm surface. Keep your hands under your shoulders and your knees under your hips. You may place padding under your knees for comfort.  Find your neutral spine and gently tense your abdominal muscles so that  you can maintain this position. Your shoulders and hips should form a rectangle that is parallel with the floor and is not twisted.  Keeping your trunk steady, lift your right hand no higher than your shoulder and then your left leg no higher than your hip. Make sure you are not holding your breath. Hold this position __________ seconds.  Continuing to keep your abdominal muscles tense and your back steady, slowly return to your starting position. Repeat with the opposite arm and leg. Repeat __________ times. Complete this exercise __________ times per day.  STRENGTHENING - Abdominals and Quadriceps, Straight Leg Raise   Lie on a firm bed or floor with both legs extended in front of you.  Keeping one leg in contact with the floor, bend the other knee so that your foot can rest flat on the floor.  Find your neutral spine, and tense your abdominal muscles to maintain your spinal position throughout the exercise.  Slowly lift your straight leg off the floor about 6 inches for a count of 15, making sure to not hold your breath.  Still keeping your neutral spine, slowly lower your leg all the way to the floor. Repeat this exercise with each leg __________ times. Complete this exercise __________ times per day. POSTURE AND BODY MECHANICS CONSIDERATIONS - Sciatica Keeping correct posture when sitting, standing or completing your activities will reduce the stress put on different body tissues, allowing injured tissues a chance to heal and limiting painful experiences. The following are general guidelines for improved posture. Your physician or physical therapist will provide you with any instructions specific to your needs. While reading these guidelines, remember:  The exercises prescribed by your provider will help you have the flexibility and strength to maintain correct postures.  The correct posture provides the optimal environment for your joints to work. All of your joints have less wear and  tear when properly supported by a spine with good posture. This means you will experience a healthier, less painful body.  Correct posture must be practiced with all of your activities, especially prolonged sitting and standing. Correct posture is as important when doing repetitive low-stress activities (typing) as it is when doing a single heavy-load activity (lifting). RESTING POSITIONS Consider which positions are most painful for you when choosing a resting position. If you have pain with flexion-based activities (sitting, bending, stooping, squatting), choose a position that allows you to rest in a less flexed posture. You would want to avoid curling into a fetal position on your side. If your pain worsens with extension-based activities (prolonged standing, working overhead), avoid resting in an extended position such as sleeping on your stomach. Most people will find more comfort when they rest with their spine in  a more neutral position, neither too rounded nor too arched. Lying on a non-sagging bed on your side with a pillow between your knees, or on your back with a pillow under your knees will often provide some relief. Keep in mind, being in any one position for a prolonged period of time, no matter how correct your posture, can still lead to stiffness. PROPER SITTING POSTURE In order to minimize stress and discomfort on your spine, you must sit with correct posture Sitting with good posture should be effortless for a healthy body. Returning to good posture is a gradual process. Many people can work toward this most comfortably by using various supports until they have the flexibility and strength to maintain this posture on their own. When sitting with proper posture, your ears will fall over your shoulders and your shoulders will fall over your hips. You should use the back of the chair to support your upper back. Your low back will be in a neutral position, just slightly arched. You may place a  small pillow or folded towel at the base of your low back for support.  When working at a desk, create an environment that supports good, upright posture. Without extra support, muscles fatigue and lead to excessive strain on joints and other tissues. Keep these recommendations in mind: CHAIR:   A chair should be able to slide under your desk when your back makes contact with the back of the chair. This allows you to work closely.  The chair's height should allow your eyes to be level with the upper part of your monitor and your hands to be slightly lower than your elbows. BODY POSITION  Your feet should make contact with the floor. If this is not possible, use a foot rest.  Keep your ears over your shoulders. This will reduce stress on your neck and low back. INCORRECT SITTING POSTURES   If you are feeling tired and unable to assume a healthy sitting posture, do not slouch or slump. This puts excessive strain on your back tissues, causing more damage and pain. Healthier options include:  Using more support, like a lumbar pillow.  Switching tasks to something that requires you to be upright or walking.  Talking a brief walk.  Lying down to rest in a neutral-spine position. PROLONGED STANDING WHILE SLIGHTLY LEANING FORWARD  When completing a task that requires you to lean forward while standing in one place for a long time, place either foot up on a stationary 2-4 inch high object to help maintain the best posture. When both feet are on the ground, the low back tends to lose its slight inward curve. If this curve flattens (or becomes too large), then the back and your other joints will experience too much stress, fatigue more quickly and can cause pain.  CORRECT STANDING POSTURES Proper standing posture should be assumed with all daily activities, even if they only take a few moments, like when brushing your teeth. As in sitting, your ears should fall over your shoulders and your shoulders  should fall over your hips. You should keep a slight tension in your abdominal muscles to brace your spine. Your tailbone should point down to the ground, not behind your body, resulting in an over-extended swayback posture.  INCORRECT STANDING POSTURES  Common incorrect standing postures include a forward head, locked knees and/or an excessive swayback. WALKING Walk with an upright posture. Your ears, shoulders and hips should all line-up. PROLONGED ACTIVITY IN A FLEXED POSITION When  completing a task that requires you to bend forward at your waist or lean over a low surface, try to find a way to stabilize 3 of 4 of your limbs. You can place a hand or elbow on your thigh or rest a knee on the surface you are reaching across. This will provide you more stability so that your muscles do not fatigue as quickly. By keeping your knees relaxed, or slightly bent, you will also reduce stress across your low back. CORRECT LIFTING TECHNIQUES DO :   Assume a wide stance. This will provide you more stability and the opportunity to get as close as possible to the object which you are lifting.  Tense your abdominals to brace your spine; then bend at the knees and hips. Keeping your back locked in a neutral-spine position, lift using your leg muscles. Lift with your legs, keeping your back straight.  Test the weight of unknown objects before attempting to lift them.  Try to keep your elbows locked down at your sides in order get the best strength from your shoulders when carrying an object.  Always ask for help when lifting heavy or awkward objects. INCORRECT LIFTING TECHNIQUES DO NOT:   Lock your knees when lifting, even if it is a small object.  Bend and twist. Pivot at your feet or move your feet when needing to change directions.  Assume that you cannot safely pick up a paperclip without proper posture.   This information is not intended to replace advice given to you by your health care provider.  Make sure you discuss any questions you have with your health care provider.   Document Released: 05/11/2005 Document Revised: 09/25/2014 Document Reviewed: 08/23/2008 [employer] Patient Education [employer]

## 2015-07-22 NOTE — ED Provider Notes (Signed)
CSN: [id number]     Arrival date & time 07/22/15  1622 History   First MD Initiated Contact with Patient 07/22/15 1736     Chief Complaint  Patient presents with  . Hip Pain   HPI  Holly Cortez is a 55 year old female with a past medical history of bipolar disorder resenting with right back and hip pain. She reports onset of pain approximately one month ago. The pain is in the right lower back and right buttock. She states that it radiates into the right hip and down the back of the right leg. She describes the pain as a "charley horse" in her buttock. The pain is constant. The pain is exacerbated by movement such as crouching down. She has tried Tylenol and ibuprofen without significant relief. She reports receiving a Toradol shot by her PCP 1 month ago which provided significant relief. She also reports moderate relief with Robaxin prescription from recent emergency department visit. She denies bowel or bladder incontinence, numbness in the groin or lower extremities, weakness of the lower extremities or gait instability. She states her symptoms are the same as when first diagnosed by her PCP with sciatica. She has a follow-up appointment scheduled with her PCP in March but states she could not wait to see them due to the pain. She had a hip x-ray in January which revealed arthritis. She denies recent trauma to the hip or back. She has no other complaints today.  Past Medical History  Diagnosis Date  . Bipolar disorder (HCC)   . Pneumonia   . Emphysema lung [facility])    Past Surgical History  Procedure Laterality Date  . Appendectomy     Family History  Problem Relation Age of Onset  . Heart disease Mother   . Hyperlipidemia Father    Social History  Substance Use Topics  . Smoking status: Current Every Day Smoker -- 0.25 packs/day    Types: Cigarettes  . Smokeless tobacco: Never Used  . Alcohol Use: No   OB History    No data available     Review of Systems  Musculoskeletal:  Positive for myalgias and back pain.  All other systems reviewed and are negative.     Allergies  Flexeril and Benadryl  Home Medications   Prior to Admission medications   Medication Sig Start Date End Date Taking? Authorizing Provider  albuterol (PROVENTIL HFA;VENTOLIN HFA) 108 (90 BASE) MCG/ACT inhaler Inhale 2 puffs into the lungs every 6 (six) hours as needed for wheezing or shortness of breath. 04/27/15   Costin Otelia Sergeant, MD  aspirin 325 MG tablet Take 325 mg by mouth every 4 (four) hours as needed for pain.    Historical Provider, MD  budesonide-formoterol (SYMBICORT) 80-4.5 MCG/ACT inhaler Inhale 2 puffs into the lungs 2 (two) times daily. 04/27/15   Costin Otelia Sergeant, MD  busPIRone (BUSPAR) 5 MG tablet Take 5 mg by mouth 3 (three) times daily.    Historical Provider, MD  divalproex (DEPAKOTE) 250 MG DR tablet Take 750 mg by mouth at bedtime.    Historical Provider, MD  FLUoxetine (PROZAC) 20 MG capsule Take 20 mg by mouth daily.    Historical Provider, MD  gabapentin (NEURONTIN) 400 MG capsule Take 1 capsule (400 mg total) by mouth 3 (three) times daily. 06/13/15   Massie Maroon, FNP  ibuprofen (ADVIL,MOTRIN) 600 MG tablet Take 1 tablet (600 mg total) by mouth every 8 (eight) hours as needed. 05/31/15   Massie Maroon, FNP  Melatonin  3 MG TABS Take 2 tablets by mouth at bedtime.    Historical Provider, MD  methocarbamol (ROBAXIN) 500 MG tablet Take 1 tablet (500 mg total) by mouth 2 (two) times daily. 06/28/15   Mady Gemma, PA-C  methocarbamol (ROBAXIN) 500 MG tablet Take 1 tablet (500 mg total) by mouth 2 (two) times daily. 07/22/15   Kelle Ruppert, PA-C  naproxen (NAPROSYN) 500 MG tablet Take 1 tablet (500 mg total) by mouth 2 (two) times daily with a meal. 06/28/15   Mady Gemma, PA-C  predniSONE (DELTASONE) 50 MG tablet Take 1 tablet once a day for 5 days 07/22/15   Dalyla Chui, PA-C  zolpidem (AMBIEN) 10 MG tablet Take 10 mg by mouth at bedtime as needed for  sleep.    Historical Provider, MD   BP 113/77 mmHg  Pulse 82  Temp(Src) 97.5 F (36.4 C) (Oral)  Resp 16  Ht 5\' 3"  (1.6 m)  Wt 71.668 kg  BMI 28.00 kg/m2  SpO2 100%  LMP 05/27/2011 Physical Exam  Constitutional: She appears well-developed and well-nourished. No distress.  HENT:  Head: Normocephalic and atraumatic.  Eyes: Conjunctivae are normal. Right eye exhibits no discharge. Left eye exhibits no discharge. No scleral icterus.  Neck: Normal range of motion.  Cardiovascular: Normal rate and intact distal pulses.   Pedal pulses palpable  Pulmonary/Chest: Effort normal. No respiratory distress.  Musculoskeletal: Normal range of motion.       Lumbar back: She exhibits tenderness. She exhibits normal range of motion, no deformity and no spasm.       Legs: Mild tenderness of the right buttock. No tenderness over the lumbar spine. No bony deformities of the spine. Full range of motion of the thoracic and lumbar spine. Full range of motion of the bilateral lower extremities. Patient is able to walk with a steady gait but she favors her right leg. No overlying skin changes or rashes noted. Positive straight leg raise.  Neurological: She is alert. Coordination normal.  5/5 strength in the bilateral lower extremities. Sensation to light touch intact throughout.  Skin: Skin is warm and dry.  Psychiatric: She has a normal mood and affect. Her behavior is normal.  Nursing note and vitals reviewed.   ED Course  Procedures (including critical care time) Labs Review Labs Reviewed - No data to display  Imaging Review No results found. I have personally reviewed and evaluated these images and lab results as part of my medical decision-making.   EKG Interpretation None      MDM   Final diagnoses:  Sciatica of right side   55 year old female presenting with right back, buttock and hip pain 1 month. Previously diagnosed with sciatica by her PCP. She was seen proximally 3 weeks ago at  this emergency part for same complaint. No change in symptoms since January. Afebrile. Non-focal neuro exam. Tenderness over the right buttock. Full range of motion of the lumbar spine and bilateral lower extremities. Patient is able to ambulate though with some discomfort. No loss of bowel or bladder control. No numbness or weakness in the lower extremities. Presentation consistent with sciatica. No indication for imaging. Toradol given in emergency department with moderate relief of symptoms. Will discharge with Robaxin as this provided relief previously. Will also discharge with 5 day steroid burst. Encouraged follow-up with her PCP for possible referral to orthopedics or spine specialist. Return precautions discussed and given in discharge paperwork. Pt is stable for discharge.      Rolm Gala Rudransh Bellanca,  PA-C 07/22/15 1831  Geoffery Lyons, MD 07/23/15 [id number]

## 2015-07-22 NOTE — ED Notes (Signed)
Right hip with no injury. Hx of same pain diagnosed with sciatica 3 weeks ago.

## 2015-07-22 NOTE — ED Notes (Signed)
Pt given d/c instructions. Verbalizes understanding. No questions. 

## 2015-08-12 ENCOUNTER — Ambulatory Visit (INDEPENDENT_AMBULATORY_CARE_PROVIDER_SITE_OTHER): Payer: Self-pay | Admitting: Family Medicine

## 2015-08-12 ENCOUNTER — Encounter: Payer: Self-pay | Admitting: Family Medicine

## 2015-08-12 VITALS — BP 112/69 | HR 76 | Temp 97.8°F | Resp 16 | Ht 64.0 in | Wt 165.0 lb

## 2015-08-12 DIAGNOSIS — M25551 Pain in right hip: Secondary | ICD-10-CM

## 2015-08-12 DIAGNOSIS — F17200 Nicotine dependence, unspecified, uncomplicated: Secondary | ICD-10-CM

## 2015-08-12 DIAGNOSIS — M5441 Lumbago with sciatica, right side: Secondary | ICD-10-CM

## 2015-08-12 DIAGNOSIS — M62830 Muscle spasm of back: Secondary | ICD-10-CM

## 2015-08-12 DIAGNOSIS — J449 Chronic obstructive pulmonary disease, unspecified: Secondary | ICD-10-CM

## 2015-08-12 LAB — RHEUMATOID FACTOR

## 2015-08-12 MED ORDER — BUDESONIDE-FORMOTEROL FUMARATE 80-4.5 MCG/ACT IN AERO: 2.0000 | INHALATION_SPRAY | Freq: Two times a day (BID) | RESPIRATORY_TRACT | Status: DC

## 2015-08-12 MED ORDER — NAPROXEN 500 MG PO TABS: 500.0000 mg | ORAL_TABLET | Freq: Two times a day (BID) | ORAL | Status: DC

## 2015-08-12 MED ORDER — KETOROLAC TROMETHAMINE 60 MG/2ML IM SOLN
60.0000 mg | Freq: Once | INTRAMUSCULAR | Status: AC
  Administered 2015-08-12: 60 mg via INTRAMUSCULAR

## 2015-08-12 MED ORDER — METHOCARBAMOL 500 MG PO TABS: 500.0000 mg | ORAL_TABLET | Freq: Two times a day (BID) | ORAL | Status: DC

## 2015-08-12 NOTE — Patient Instructions (Signed)

## 2015-08-12 NOTE — Progress Notes (Signed)
Subjective:    Patient ID: Holly Cortez, female    DOB: 08/16/1960, 55 y.o.   MRN: [id number]  HPI Holly Cortez, a 55 year old female with a history of emphysema, bipolar depression, and right hip pain presents complaining of low back pain and right hip pain. She is currently complaining of right hip pain and low back pain.The onset of symptoms was several weeks ago. She has not identified an inciting event. She denies knee pain.  Aggravating symptoms: inactivity, kneeling, lateral movements, pivoting, squatting and standing. Symptoms have progressed over the past several. Current pain intensity is 6/10. She describes pain as burning and radiating to thighs bilaterally.  Pain is minimally controlled on Gabapentin. She states that she had satisfactory pain relief with last Toradol injection received in the office in January.  She maintains that she had Aleve several days ago with minimal relief.  She denies fever, fatigue, constipation, dysuria, bowel or bladder incontinence.  Past Medical History  Diagnosis Date  . Bipolar disorder (HCC)   . Pneumonia   . Emphysema lung (HCC)    Social History   Social History  . Marital Status: Single    Spouse Name: N/A  . Number of Children: N/A  . Years of Education: N/A   Occupational History  . Not on file.   Social History Main Topics  . Smoking status: Current Every Day Smoker -- 0.25 packs/day    Types: Cigarettes  . Smokeless tobacco: Never Used  . Alcohol Use: No  . Drug Use: No  . Sexual Activity: Not on file   Other Topics Concern  . Not on file   Social History Narrative   Immunization History  Administered Date(s) Administered  . Influenza,inj,Quad PF,36+ Mos 04/27/2015  . Pneumococcal Polysaccharide-23 04/27/2015   Allergies  Allergen Reactions  . Flexeril [Cyclobenzaprine] Other (See Comments)    "Makes my legs crawl"  . Benadryl [Diphenhydramine] Palpitations   Review of Systems  Constitutional: Negative  for fever, fatigue and unexpected weight change.  HENT: Positive for dental problem.   Respiratory: Negative.  Negative for apnea, chest tightness, shortness of breath and wheezing.   Cardiovascular: Negative.   Gastrointestinal: Negative.  Negative for nausea, abdominal pain, constipation and abdominal distention.  Endocrine: Negative for cold intolerance, heat intolerance, polydipsia and polyuria.  Genitourinary: Negative.  Negative for hematuria.  Musculoskeletal: Positive for myalgias (right hip pain).  Skin: Negative.   Neurological: Negative for dizziness, weakness and headaches.  Hematological: Negative.   Psychiatric/Behavioral: Negative for suicidal ideas, hallucinations, behavioral problems and decreased concentration.       Objective:   Physical Exam  Constitutional: She is oriented to person, place, and time. She appears well-developed and well-nourished.  HENT:  Head: Normocephalic and atraumatic.  Right Ear: External ear normal.  Left Ear: External ear normal.  Mouth/Throat: Oropharynx is clear and moist. Abnormal dentition (partially edentulous). Dental caries present.  Eyes: Conjunctivae and EOM are normal. Pupils are equal, round, and reactive to light.  Neck: Normal range of motion. Neck supple.  Cardiovascular: Normal rate, regular rhythm, normal heart sounds and intact distal pulses.   Pulmonary/Chest: Effort normal and breath sounds normal.  Abdominal: Soft. Bowel sounds are normal.  Musculoskeletal:       Right hip: She exhibits decreased range of motion and decreased strength. She exhibits no tenderness and no swelling.  Neurological: She is alert and oriented to person, place, and time. She has normal reflexes.  Skin: Skin is warm and dry.  Psychiatric: She has a normal mood and affect. Her behavior is normal. Judgment and thought content normal.     BP 112/69 mmHg  Pulse 76  Temp(Src) 97.8 F (36.6 C) (Oral)  Resp 16  Ht  (1.626 m)  Wt 165 lb  (74.844 kg)  BMI 28.31 kg/m2  LMP 05/27/2011 Assessment & Plan:  1. Right hip pain Patient has a history of osteoarthritis. Previous sedimentation rate was elevated, will repeat on today. Will also check RF factor.  Previous hip xray, shows marginal osteophytes suggesting mild osteoarthritis of the right hip.  - ketorolac (TORADOL) injection 60 mg; Inject 2 mLs (60 mg total) into the muscle once. - Rheumatoid factor - Sedimentation Rate - naproxen (NAPROSYN) 500 MG tablet; Take 1 tablet (500 mg total) by mouth 2 (two) times daily with a meal.  Dispense: 60 tablet; Refill: 1  2. Right-sided low back pain with right-sided sciatica - ketorolac (TORADOL) injection 60 mg; Inject 2 mLs (60 mg total) into the muscle once. - naproxen (NAPROSYN) 500 MG tablet; Take 1 tablet (500 mg total) by mouth 2 (two) times daily with a meal.  Dispense: 60 tablet; Refill: 1  3. Tobacco dependence Smoking cessation instruction/counseling given:  counseled patient on the dangers of tobacco use, advised patient to stop smoking, and reviewed strategies to maximize success  4. Back muscle spasm - methocarbamol (ROBAXIN) 500 MG tablet; Take 1 tablet (500 mg total) by mouth 2 (two) times daily.  Dispense: 60 tablet; Refill: 1  5. Chronic obstructive pulmonary disease, unspecified COPD type (HCC)  - budesonide-formoterol (SYMBICORT) 80-4.5 MCG/ACT inhaler; Inhale 2 puffs into the lungs 2 (two) times daily.  Dispense: 1 Inhaler; Refill: 2   RTC: Patient to follow up for back pain as needed.      The patient was given clear instructions to go to ER or return to medical center if symptoms do not improve, worsen or new problems develop. The patient verbalized understanding. Will notify patient with laboratory results. Massie Maroon, FNP

## 2015-08-13 LAB — SEDIMENTATION RATE: SED RATE: 38 mm/h — AB (ref 0–30)

## 2015-10-15 ENCOUNTER — Telehealth: Payer: Self-pay

## 2015-10-15 DIAGNOSIS — M62830 Muscle spasm of back: Secondary | ICD-10-CM

## 2015-10-15 MED ORDER — METHOCARBAMOL 500 MG PO TABS: 500.0000 mg | ORAL_TABLET | Freq: Two times a day (BID) | ORAL | Status: DC

## 2015-10-15 NOTE — Telephone Encounter (Signed)
Refill for robaxin sent into pharmacy. Thanks!

## 2015-10-15 NOTE — Telephone Encounter (Signed)
Pt is requesting a medication refill for Robaxin. Thanks!

## 2015-10-17 ENCOUNTER — Telehealth: Payer: Self-pay

## 2015-10-17 DIAGNOSIS — M25551 Pain in right hip: Secondary | ICD-10-CM

## 2015-10-17 DIAGNOSIS — M5441 Lumbago with sciatica, right side: Secondary | ICD-10-CM

## 2015-10-17 MED ORDER — NAPROXEN 500 MG PO TABS: 500.0000 mg | ORAL_TABLET | Freq: Two times a day (BID) | ORAL | Status: DC

## 2015-10-17 NOTE — Telephone Encounter (Signed)
Naproxen has been refilled, Robaxin was sent in yesterday. Thanks!

## 2015-10-17 NOTE — Telephone Encounter (Signed)
Pt is requesting a refill on her Robaxin and Naproxin. Thanks!

## 2015-10-18 ENCOUNTER — Other Ambulatory Visit: Payer: Self-pay

## 2015-10-18 DIAGNOSIS — M5441 Lumbago with sciatica, right side: Secondary | ICD-10-CM

## 2015-10-18 DIAGNOSIS — M62830 Muscle spasm of back: Secondary | ICD-10-CM

## 2015-10-18 DIAGNOSIS — M25551 Pain in right hip: Secondary | ICD-10-CM

## 2015-10-18 MED ORDER — METHOCARBAMOL 500 MG PO TABS: 500.0000 mg | ORAL_TABLET | Freq: Two times a day (BID) | ORAL | Status: DC

## 2015-10-18 MED ORDER — NAPROXEN 500 MG PO TABS: 500.0000 mg | ORAL_TABLET | Freq: Two times a day (BID) | ORAL | Status: DC

## 2015-10-18 NOTE — Telephone Encounter (Signed)
Refills sent into correct pharmacy. Thanks!  

## 2015-11-05 ENCOUNTER — Encounter (HOSPITAL_BASED_OUTPATIENT_CLINIC_OR_DEPARTMENT_OTHER): Payer: Self-pay

## 2015-11-05 ENCOUNTER — Emergency Department (HOSPITAL_BASED_OUTPATIENT_CLINIC_OR_DEPARTMENT_OTHER)
Admission: EM | Admit: 2015-11-05 | Discharge: 2015-11-05 | Disposition: A | Discharge status: Home / Self Care | Payer: Self-pay | Attending: Emergency Medicine | Admitting: Emergency Medicine

## 2015-11-05 ENCOUNTER — Emergency Department (HOSPITAL_BASED_OUTPATIENT_CLINIC_OR_DEPARTMENT_OTHER): Payer: Self-pay

## 2015-11-05 DIAGNOSIS — J441 Chronic obstructive pulmonary disease with (acute) exacerbation: Secondary | ICD-10-CM | POA: Insufficient documentation

## 2015-11-05 DIAGNOSIS — J449 Chronic obstructive pulmonary disease, unspecified: Secondary | ICD-10-CM

## 2015-11-05 DIAGNOSIS — F17210 Nicotine dependence, cigarettes, uncomplicated: Secondary | ICD-10-CM | POA: Insufficient documentation

## 2015-11-05 DIAGNOSIS — R0602 Shortness of breath: Secondary | ICD-10-CM

## 2015-11-05 DIAGNOSIS — H9202 Otalgia, left ear: Secondary | ICD-10-CM | POA: Insufficient documentation

## 2015-11-05 DIAGNOSIS — F319 Bipolar disorder, unspecified: Secondary | ICD-10-CM | POA: Insufficient documentation

## 2015-11-05 DIAGNOSIS — Z7982 Long term (current) use of aspirin: Secondary | ICD-10-CM | POA: Insufficient documentation

## 2015-11-05 HISTORY — DX: Emphysema, unspecified: J43.9

## 2015-11-05 LAB — BRAIN NATRIURETIC PEPTIDE: B Natriuretic Peptide: 91.8 pg/mL (ref 0.0–100.0)

## 2015-11-05 LAB — CBC WITH DIFFERENTIAL/PLATELET
Basophils Absolute: 0 10*3/uL (ref 0.0–0.1)
Basophils Relative: 0 %
Eosinophils Absolute: 0.2 10*3/uL (ref 0.0–0.7)
Eosinophils Relative: 3 %
HEMATOCRIT: 36.2 % (ref 36.0–46.0)
HEMOGLOBIN: 11.7 g/dL — AB (ref 12.0–15.0)
LYMPHS ABS: 3.4 10*3/uL (ref 0.7–4.0)
LYMPHS PCT: 39 %
MCH: 31.5 pg (ref 26.0–34.0)
MCHC: 32.3 g/dL (ref 30.0–36.0)
MCV: 97.6 fL (ref 78.0–100.0)
MONO ABS: 0.7 10*3/uL (ref 0.1–1.0)
MONOS PCT: 8 %
NEUTROS ABS: 4.5 10*3/uL (ref 1.7–7.7)
NEUTROS PCT: 50 %
Platelets: 205 10*3/uL (ref 150–400)
RBC: 3.71 MIL/uL — ABNORMAL LOW (ref 3.87–5.11)
RDW: 13.9 % (ref 11.5–15.5)
WBC: 8.9 10*3/uL (ref 4.0–10.5)

## 2015-11-05 LAB — COMPREHENSIVE METABOLIC PANEL
ALBUMIN: 3.3 g/dL — AB (ref 3.5–5.0)
ALK PHOS: 77 U/L (ref 38–126)
ALT: 9 U/L — ABNORMAL LOW (ref 14–54)
ANION GAP: 10 (ref 5–15)
AST: 16 U/L (ref 15–41)
BILIRUBIN TOTAL: 0.4 mg/dL (ref 0.3–1.2)
BUN: 19 mg/dL (ref 6–20)
CALCIUM: 8.7 mg/dL — AB (ref 8.9–10.3)
CO2: 24 mmol/L (ref 22–32)
Chloride: 107 mmol/L (ref 101–111)
Creatinine, Ser: 0.78 mg/dL (ref 0.44–1.00)
GFR calc Af Amer: >60 mL/min (ref >60)
GLUCOSE: 123 mg/dL — AB (ref 65–99)
Potassium: 3.8 mmol/L (ref 3.5–5.1)
Sodium: 141 mmol/L (ref 135–145)
TOTAL PROTEIN: 7.1 g/dL (ref 6.5–8.1)

## 2015-11-05 LAB — TROPONIN I

## 2015-11-05 MED ORDER — ALBUTEROL SULFATE (2.5 MG/3ML) 0.083% IN NEBU
2.5000 mg | INHALATION_SOLUTION | Freq: Once | RESPIRATORY_TRACT | Status: AC
  Administered 2015-11-05: 2.5 mg via RESPIRATORY_TRACT
  Filled 2015-11-05: qty 3

## 2015-11-05 MED ORDER — OXYCODONE-ACETAMINOPHEN 5-325 MG PO TABS
1.0000 | ORAL_TABLET | Freq: Once | ORAL | Status: AC
  Administered 2015-11-05: 1 via ORAL
  Filled 2015-11-05: qty 1

## 2015-11-05 MED ORDER — BUDESONIDE-FORMOTEROL FUMARATE 80-4.5 MCG/ACT IN AERO: 2.0000 | INHALATION_SPRAY | Freq: Two times a day (BID) | RESPIRATORY_TRACT | Status: DC

## 2015-11-05 MED ORDER — IPRATROPIUM-ALBUTEROL 0.5-2.5 (3) MG/3ML IN SOLN
3.0000 mL | Freq: Once | RESPIRATORY_TRACT | Status: AC
  Administered 2015-11-05: 3 mL via RESPIRATORY_TRACT
  Filled 2015-11-05: qty 3

## 2015-11-05 MED ORDER — PREDNISONE 10 MG PO TABS: 40.0000 mg | ORAL_TABLET | Freq: Every day | ORAL | Status: AC

## 2015-11-05 MED ORDER — AZITHROMYCIN 250 MG PO TABS: 250.0000 mg | ORAL_TABLET | Freq: Every day | ORAL | Status: DC

## 2015-11-05 MED ORDER — PREDNISONE 50 MG PO TABS
60.0000 mg | ORAL_TABLET | Freq: Once | ORAL | Status: AC
  Administered 2015-11-05: 60 mg via ORAL
  Filled 2015-11-05: qty 1

## 2015-11-05 NOTE — ED Notes (Signed)
Patient transported to X-ray 

## 2015-11-05 NOTE — Progress Notes (Signed)
Patient states that she cannot afford to fill Symbicort or albuterol inhaler.  Patient states that she has been out of both for 2 1/2 to 3 months.

## 2015-11-05 NOTE — ED Notes (Signed)
Pt c/o SOB and URI symtpoms x 2 days

## 2015-11-05 NOTE — ED Provider Notes (Signed)
CSN: [id number]     Arrival date & time 11/05/15  1922 History   By signing my name below, I, Myles Gip, attest that this documentation has been prepared under the direction and in the presence of Alvira Monday, MD. Electronically Signed: Myles Gip, ED Scribe. 11/05/2015. 8:40 PM.    Chief Complaint  Patient presents with  . Shortness of Breath   The history is provided by the patient. No language interpreter was used.    HPI Comments: Holly Cortez is a 55 y.o. female with a PMHx of Pneumonia and COPD presents to the Emergency Department complaining of gradual onset, constant, worsening SOB onset 3 days ago. Pt has rhinorrhea, productive cough with green sputum, wheezing, chest heaviness, left ear pain, and sore throat. The pt states that nothing makes the SOB better or worse. Denies breathing treatments at home, but has had two albuterol treatments in the ED with mild relief. Pt has had similar symptoms in the past with COPD flares. The pt states she has been out of Buspar and Symbicort for 2 months. Denies fever, nausea, diarrhea, vomiting, abdominal pain, leg pain, leg swelling, and CP. The pt is a 0.5 ppd current everyday smoker.  Past Medical History  Diagnosis Date  . Bipolar disorder (HCC)   . Pneumonia   . Emphysema lung (HCC)   . Emphysema of lung [facility])    Past Surgical History  Procedure Laterality Date  . Appendectomy     Family History  Problem Relation Age of Onset  . Heart disease Mother   . Hyperlipidemia Father    Social History  Substance Use Topics  . Smoking status: Current Every Day Smoker -- 0.50 packs/day    Types: Cigarettes  . Smokeless tobacco: Never Used  . Alcohol Use: No   OB History    No data available     Review of Systems  Constitutional: Negative for fever.  HENT: Positive for ear pain (Left ear pain) and sore throat.   Eyes: Negative for visual disturbance.  Respiratory: Positive for cough, shortness of breath and wheezing.         Chest heaviness   Cardiovascular: Negative for chest pain.  Gastrointestinal: Negative for nausea, vomiting, abdominal pain and diarrhea.  Genitourinary: Negative for difficulty urinating.  Musculoskeletal: Negative for back pain, joint swelling, arthralgias and neck pain.  Skin: Negative for rash.  Neurological: Negative for syncope and headaches.      Allergies  Flexeril and Benadryl  Home Medications   Prior to Admission medications   Medication Sig Start Date End Date Taking? Authorizing Provider  albuterol (PROVENTIL HFA;VENTOLIN HFA) 108 (90 BASE) MCG/ACT inhaler Inhale 2 puffs into the lungs every 6 (six) hours as needed for wheezing or shortness of breath. 04/27/15   Costin Otelia Sergeant, MD  aspirin 325 MG tablet Take 325 mg by mouth every 4 (four) hours as needed for pain.    Historical Provider, MD  azithromycin (ZITHROMAX) 250 MG tablet Take 1 tablet (250 mg total) by mouth daily. Take first 2 tablets together, then 1 every day until finished. 11/05/15   Alvira Monday, MD  budesonide-formoterol (SYMBICORT) 80-4.5 MCG/ACT inhaler Inhale 2 puffs into the lungs 2 (two) times daily. 11/05/15   Alvira Monday, MD  busPIRone (BUSPAR) 5 MG tablet Take 5 mg by mouth 3 (three) times daily.    Historical Provider, MD  divalproex (DEPAKOTE) 250 MG DR tablet Take 750 mg by mouth at bedtime.    Historical Provider, MD  FLUoxetine (  PROZAC) 20 MG capsule Take 20 mg by mouth daily.    Historical Provider, MD  gabapentin (NEURONTIN) 400 MG capsule Take 1 capsule (400 mg total) by mouth 3 (three) times daily. 06/13/15   Massie Maroon, FNP  Melatonin 3 MG TABS Take 2 tablets by mouth at bedtime.    Historical Provider, MD  methocarbamol (ROBAXIN) 500 MG tablet Take 1 tablet (500 mg total) by mouth 2 (two) times daily. 10/18/15   Massie Maroon, FNP  naproxen (NAPROSYN) 500 MG tablet Take 1 tablet (500 mg total) by mouth 2 (two) times daily with a meal. 10/18/15   Massie Maroon, FNP   predniSONE (DELTASONE) 10 MG tablet Take 4 tablets (40 mg total) by mouth daily. 11/05/15 11/09/15  Alvira Monday, MD  zolpidem (AMBIEN) 10 MG tablet Take 10 mg by mouth at bedtime as needed for sleep.    Historical Provider, MD   BP 121/71 mmHg  Pulse 86  Temp(Src) 98 F (36.7 C)  Resp 16  Ht  (1.6 m)  Wt 160 lb (72.576 kg)  BMI 28.35 kg/m2  SpO2 95%  LMP 05/27/2011 Physical Exam  Constitutional: She is oriented to person, place, and time. She appears well-developed and well-nourished. No distress.  HENT:  Head: Normocephalic and atraumatic.  Right Ear: Tympanic membrane normal.  Left Ear: Tympanic membrane normal.  Eyes: Conjunctivae and EOM are normal.  Neck: Normal range of motion.  Cardiovascular: Normal rate, regular rhythm, normal heart sounds and intact distal pulses.  Exam reveals no gallop and no friction rub.   No murmur heard. Pulmonary/Chest: Effort normal. No respiratory distress. She has wheezes. She has no rales.  Wheezing with forced expiration  Abdominal: Soft. She exhibits no distension. There is no tenderness. There is no guarding.  Musculoskeletal: She exhibits no edema or tenderness.  Neurological: She is alert and oriented to person, place, and time.  Skin: Skin is warm and dry. No rash noted. She is not diaphoretic. No erythema.  Nursing note and vitals reviewed.   ED Course  Procedures  DIAGNOSTIC STUDIES: Oxygen Saturation is 94% on RA, adequate by my interpretation.    COORDINATION OF CARE: 8:41 PM-Discussed treatment plan which includes lab work and medications with pt at bedside and pt agreed to plan.     Labs Review Labs Reviewed  CBC WITH DIFFERENTIAL/PLATELET - Abnormal; Notable for the following:    RBC 3.71 (*)    Hemoglobin 11.7 (*)    All other components within normal limits  COMPREHENSIVE METABOLIC PANEL - Abnormal; Notable for the following:    Glucose, Bld 123 (*)    Calcium 8.7 (*)    Albumin 3.3 (*)    ALT 9 (*)     All other components within normal limits  TROPONIN I  BRAIN NATRIURETIC PEPTIDE    Imaging Review Dg Chest 2 View  11/05/2015  CLINICAL DATA:  Shortness of breath and upper respiratory tract symptoms. Smoker. EXAM: CHEST  2 VIEW COMPARISON:  CT chest 04/25/2015. Single view of the chest 04/24/2015. FINDINGS: The lungs are emphysematous with scattered scar. No consolidative process, pneumothorax or effusion. Heart size is normal. No focal bony abnormality. IMPRESSION: Emphysema without acute disease. Electronically Signed   By: Drusilla Kanner M.D.   On: 11/05/2015 20:45   I have personally reviewed and evaluated these images and lab results as part of my medical decision-making.   EKG Interpretation   Date/Time:  Tuesday November 05 2015 20:11:15 EDT Ventricular Rate:  85 PR Interval:  132 QRS Duration: 100 QT Interval:  385 QTC Calculation: 458 R Axis:   27 Text Interpretation:  Sinus rhythm Probable left atrial enlargement RSR'  in V1 or V2, right VCD or RVH Baseline wander in lead(s) V3 No significant  change since last tracing Confirmed by [facility] MD, Cypress Hinkson ([id number]) on  11/06/2015 4:38:51 PM      MDM   Final diagnoses:  Shortness of breath  COPD exacerbation (HCC)    55yo female with history of COPD presents with concern for productive cough and shortness of breath. Differential diagnosis for dyspnea includes ACS, PE, COPD exacerbation, CHF exacerbation, anemia, pneumonia.  Chest x-ray was done which showed no pneumonia. EKG was evaluated by me which showed no acute changes.  BNP was normal.  CP similar to prior COPD exacerbations and troponin negative.  Given cough, congestion, wheezing and improvement with nebulizer treatments doubt PE.  Pt given prednisone, duonebs in ED. Given rx for prednisone for 4 days, azithromycin for COPD exacerbation. Appropriate for outpt treatment. Patient discharged in stable condition with understanding of reasons to return.   I personally  performed the services described in this documentation, which was scribed in my presence. The recorded information has been reviewed and is accurate.     Alvira MondayErin Rylen Swindler, MD 11/06/15 [phone]

## 2015-12-20 ENCOUNTER — Other Ambulatory Visit: Payer: Self-pay | Admitting: Family Medicine

## 2015-12-20 DIAGNOSIS — M62830 Muscle spasm of back: Secondary | ICD-10-CM

## 2015-12-28 ENCOUNTER — Other Ambulatory Visit: Payer: Self-pay | Admitting: Family Medicine

## 2015-12-28 DIAGNOSIS — M5441 Lumbago with sciatica, right side: Secondary | ICD-10-CM

## 2015-12-28 DIAGNOSIS — M25551 Pain in right hip: Secondary | ICD-10-CM

## 2016-02-13 ENCOUNTER — Other Ambulatory Visit: Payer: Self-pay | Admitting: Family Medicine

## 2016-02-13 DIAGNOSIS — M62830 Muscle spasm of back: Secondary | ICD-10-CM

## 2016-02-21 ENCOUNTER — Ambulatory Visit (INDEPENDENT_AMBULATORY_CARE_PROVIDER_SITE_OTHER): Payer: Self-pay | Admitting: Family Medicine

## 2016-02-21 ENCOUNTER — Encounter: Payer: Self-pay | Admitting: Family Medicine

## 2016-02-21 ENCOUNTER — Other Ambulatory Visit: Payer: Self-pay | Admitting: Family Medicine

## 2016-02-21 VITALS — BP 120/81 | HR 78 | Temp 97.5°F | Resp 14 | Ht 63.0 in | Wt 152.0 lb

## 2016-02-21 DIAGNOSIS — M62830 Muscle spasm of back: Secondary | ICD-10-CM

## 2016-02-21 DIAGNOSIS — M5441 Lumbago with sciatica, right side: Secondary | ICD-10-CM

## 2016-02-21 MED ORDER — METHOCARBAMOL 500 MG PO TABS: 500.0000 mg | ORAL_TABLET | Freq: Two times a day (BID) | ORAL | 1 refills | Status: DC

## 2016-02-21 NOTE — Patient Instructions (Signed)
As soon as possible, please get your, pap, mammogram, and immuniztion.

## 2016-02-21 NOTE — Progress Notes (Signed)
Holly Cortez, is a 55 y.o. female  UJW:[id number]:[id number]  FAO:[id number]  DOB - 02/15/1961  CC:  Chief Complaint  Patient presents with  . Hip Pain    right hip pain        HPI: Holly Cortez is a 55 y.o. female here for follow-up of chronic hip and leg pain. She has been prescribed gabapentin and naproxen which has not helped. She reports a Toredol shot only helps for about a day. Robaxin has been the most help.  Health Maintenance:  Needs most health items but declines due to lack of funds.   Allergies  Allergen Reactions  . Flexeril [Cyclobenzaprine] Other (See Comments)    "Makes my legs crawl"  . Benadryl [Diphenhydramine] Palpitations   Past Medical History:  Diagnosis Date  . Bipolar disorder (HCC)   . Emphysema lung (HCC)   . Emphysema of lung (HCC)   . Pneumonia    Current Outpatient Prescriptions on File Prior to Visit  Medication Sig Dispense Refill  . aspirin 325 MG tablet Take 325 mg by mouth every 4 (four) hours as needed for pain.    . busPIRone (BUSPAR) 5 MG tablet Take 5 mg by mouth 3 (three) times daily.    . divalproex (DEPAKOTE) 250 MG DR tablet Take 750 mg by mouth at bedtime.    Marland Kitchen. FLUoxetine (PROZAC) 20 MG capsule Take 20 mg by mouth daily.    Marland Kitchen. gabapentin (NEURONTIN) 400 MG capsule Take 1 capsule (400 mg total) by mouth 3 (three) times daily. 90 capsule 1  . Melatonin 3 MG TABS Take 2 tablets by mouth at bedtime.    . methocarbamol (ROBAXIN) 500 MG tablet TAKE 1 TABLET (500 MG TOTAL) BY MOUTH 2 (TWO) TIMES DAILY. 60 tablet 1  . naproxen (NAPROSYN) 500 MG tablet TAKE 1 TABLET (500 MG TOTAL) BY MOUTH 2 (TWO) TIMES DAILY WITH A MEAL. 60 tablet 1  . zolpidem (AMBIEN) 10 MG tablet Take 10 mg by mouth at bedtime as needed for sleep.    Marland Kitchen. albuterol (PROVENTIL HFA;VENTOLIN HFA) 108 (90 BASE) MCG/ACT inhaler Inhale 2 puffs into the lungs every 6 (six) hours as needed for wheezing or shortness of breath. (Patient not taking: Reported on 02/21/2016) 1 Inhaler 2   . azithromycin (ZITHROMAX) 250 MG tablet Take 1 tablet (250 mg total) by mouth daily. Take first 2 tablets together, then 1 every day until finished. (Patient not taking: Reported on 02/21/2016) 6 tablet 0  . budesonide-formoterol (SYMBICORT) 80-4.5 MCG/ACT inhaler Inhale 2 puffs into the lungs 2 (two) times daily. (Patient not taking: Reported on 02/21/2016) 1 Inhaler 2   No current facility-administered medications on file prior to visit.    Family History  Problem Relation Age of Onset  . Heart disease Mother   . Hyperlipidemia Father    Social History   Social History  . Marital status: Single    Spouse name: N/A  . Number of children: N/A  . Years of education: N/A   Occupational History  . Not on file.   Social History Main Topics  . Smoking status: Current Every Day Smoker    Packs/day: 0.25    Types: Cigarettes  . Smokeless tobacco: Never Used  . Alcohol use No  . Drug use: No  . Sexual activity: Not on file   Other Topics Concern  . Not on file   Social History Narrative  . No narrative on file    Review of Systems: Constitutional: Negative Skin: Negative  HENT: Negative  Eyes: Negative  Neck: Negative Respiratory: Negative Cardiovascular: Negative Gastrointestinal: Negative Genitourinary: Negative  Musculoskeletal: Positive for low back, hip and leg pain Neurological: Negative for Hematological: Negative  Psychiatric/Behavioral: Negative    Objective:   Vitals:   02/21/16 0822  BP: 120/81  Pulse: 78  Resp: 14  Temp: 97.5 F (36.4 C)    Physical Exam: Constitutional: Patient appears well-developed and well-nourished. No distress. HENT: Normocephalic, atraumatic, External right and left ear normal. Oropharynx is clear and moist.  Eyes: Conjunctivae and EOM are normal. PERRLA, no scleral icterus. Neck: Normal ROM. Neck supple. No lymphadenopathy, No thyromegaly. CVS: RRR, S1/S2 +, no murmurs, no gallops, no rubs Pulmonary: Effort and breath  sounds normal, no stridor, rhonchi, wheezes, rales.  Abdominal: Soft. Normoactive BS,, no distension, tenderness, rebound or guarding.  Musculoskeletal: Normal range of motion. No edema and no tenderness. Straight leg raises + at 15 degrees. Neuro: Alert.Normal muscle tone coordination. Non-focal Skin: Skin is warm and dry. No rash noted. Not diaphoretic. No erythema. No pallor. Psychiatric: Normal mood and affect. Behavior, judgment, thought content normal.  Lab Results  Component Value Date   WBC 8.9 11/05/2015   HGB 11.7 (L) 11/05/2015   HCT 36.2 11/05/2015   MCV 97.6 11/05/2015   PLT 205 11/05/2015   Lab Results  Component Value Date   CREATININE 0.78 11/05/2015   BUN 19 11/05/2015   NA 141 11/05/2015   K 3.8 11/05/2015   CL 107 11/05/2015   CO2 24 11/05/2015    Lab Results  Component Value Date   HGBA1C 5.7 (H) 05/10/2015   Lipid Panel  No results found for: CHOL, TRIG, HDL, CHOLHDL, VLDL, LDLCALC     Assessment and plan:   1, Low back pain with sciatica and spasms -Refill of Robaxin per medication list.   The patient was given clear instructions to go to ER or return to medical center if symptoms don't improve, worsen or new problems develop. The patient verbalized understanding.    Henrietta Hoover FNP  02/21/2016, 8:51 AM

## 2016-03-09 ENCOUNTER — Emergency Department (HOSPITAL_BASED_OUTPATIENT_CLINIC_OR_DEPARTMENT_OTHER): Payer: Self-pay

## 2016-03-09 ENCOUNTER — Encounter (HOSPITAL_BASED_OUTPATIENT_CLINIC_OR_DEPARTMENT_OTHER): Payer: Self-pay | Admitting: Emergency Medicine

## 2016-03-09 ENCOUNTER — Emergency Department (HOSPITAL_BASED_OUTPATIENT_CLINIC_OR_DEPARTMENT_OTHER)
Admission: EM | Admit: 2016-03-09 | Discharge: 2016-03-09 | Disposition: A | Discharge status: Home / Self Care | Payer: Self-pay | Attending: Emergency Medicine | Admitting: Emergency Medicine

## 2016-03-09 DIAGNOSIS — M5441 Lumbago with sciatica, right side: Secondary | ICD-10-CM | POA: Insufficient documentation

## 2016-03-09 DIAGNOSIS — F17210 Nicotine dependence, cigarettes, uncomplicated: Secondary | ICD-10-CM | POA: Insufficient documentation

## 2016-03-09 DIAGNOSIS — J449 Chronic obstructive pulmonary disease, unspecified: Secondary | ICD-10-CM | POA: Insufficient documentation

## 2016-03-09 DIAGNOSIS — Z7982 Long term (current) use of aspirin: Secondary | ICD-10-CM | POA: Insufficient documentation

## 2016-03-09 DIAGNOSIS — R0602 Shortness of breath: Secondary | ICD-10-CM

## 2016-03-09 DIAGNOSIS — Z79899 Other long term (current) drug therapy: Secondary | ICD-10-CM | POA: Insufficient documentation

## 2016-03-09 LAB — CBC WITH DIFFERENTIAL/PLATELET
BASOS ABS: 0.1 10*3/uL (ref 0.0–0.1)
Basophils Relative: 1 %
EOS PCT: 4 %
Eosinophils Absolute: 0.3 10*3/uL (ref 0.0–0.7)
HEMATOCRIT: 32.7 % — AB (ref 36.0–46.0)
Hemoglobin: 10.7 g/dL — ABNORMAL LOW (ref 12.0–15.0)
LYMPHS ABS: 2.6 10*3/uL (ref 0.7–4.0)
LYMPHS PCT: 38 %
MCH: 31.5 pg (ref 26.0–34.0)
MCHC: 32.7 g/dL (ref 30.0–36.0)
MCV: 96.2 fL (ref 78.0–100.0)
Monocytes Absolute: 0.8 10*3/uL (ref 0.1–1.0)
Monocytes Relative: 12 %
NEUTROS ABS: 3.1 10*3/uL (ref 1.7–7.7)
Neutrophils Relative %: 45 %
PLATELETS: 245 10*3/uL (ref 150–400)
RBC: 3.4 MIL/uL — AB (ref 3.87–5.11)
RDW: 13.9 % (ref 11.5–15.5)
WBC: 6.9 10*3/uL (ref 4.0–10.5)

## 2016-03-09 LAB — BASIC METABOLIC PANEL
ANION GAP: 8 (ref 5–15)
BUN: 7 mg/dL (ref 6–20)
CHLORIDE: 108 mmol/L (ref 101–111)
CO2: 24 mmol/L (ref 22–32)
Calcium: 9 mg/dL (ref 8.9–10.3)
Creatinine, Ser: 0.73 mg/dL (ref 0.44–1.00)
GFR calc Af Amer: >60 mL/min (ref >60)
GLUCOSE: 127 mg/dL — AB (ref 65–99)
POTASSIUM: 3.1 mmol/L — AB (ref 3.5–5.1)
Sodium: 140 mmol/L (ref 135–145)

## 2016-03-09 MED ORDER — METHOCARBAMOL 1000 MG/10ML IJ SOLN
1000.0000 mg | Freq: Once | INTRAMUSCULAR | Status: AC
Start: 2016-03-09 — End: 2016-03-09
  Administered 2016-03-09: 1000 mg via INTRAMUSCULAR
  Filled 2016-03-09: qty 10

## 2016-03-09 MED ORDER — IBUPROFEN 600 MG PO TABS: 600.0000 mg | ORAL_TABLET | Freq: Four times a day (QID) | ORAL | 0 refills | Status: DC | PRN

## 2016-03-09 MED ORDER — HYDROCODONE-ACETAMINOPHEN 5-325 MG PO TABS: 1.0000 | ORAL_TABLET | ORAL | 0 refills | Status: DC | PRN

## 2016-03-09 MED ORDER — DIAZEPAM 5 MG PO TABS: 5.0000 mg | ORAL_TABLET | Freq: Two times a day (BID) | ORAL | 0 refills | Status: DC

## 2016-03-09 MED ORDER — ALBUTEROL SULFATE HFA 108 (90 BASE) MCG/ACT IN AERS
1.0000 | INHALATION_SPRAY | Freq: Once | RESPIRATORY_TRACT | Status: AC
  Administered 2016-03-09: 2 via RESPIRATORY_TRACT
  Filled 2016-03-09: qty 6.7

## 2016-03-09 MED ORDER — POTASSIUM CHLORIDE CRYS ER 20 MEQ PO TBCR
40.0000 meq | EXTENDED_RELEASE_TABLET | Freq: Once | ORAL | Status: AC
  Administered 2016-03-09: 40 meq via ORAL
  Filled 2016-03-09: qty 2

## 2016-03-09 MED ORDER — IPRATROPIUM-ALBUTEROL 0.5-2.5 (3) MG/3ML IN SOLN
3.0000 mL | Freq: Once | RESPIRATORY_TRACT | Status: AC
  Administered 2016-03-09: 3 mL via RESPIRATORY_TRACT
  Filled 2016-03-09: qty 3

## 2016-03-09 MED ORDER — ALBUTEROL SULFATE (2.5 MG/3ML) 0.083% IN NEBU
2.5000 mg | INHALATION_SOLUTION | Freq: Once | RESPIRATORY_TRACT | Status: AC
  Administered 2016-03-09: 2.5 mg via RESPIRATORY_TRACT
  Filled 2016-03-09: qty 3

## 2016-03-09 MED FILL — IBUPROFEN 600 MG TABLET: 600 | 8 days supply | Qty: 30 | Fill #0

## 2016-03-09 MED FILL — HYDROCODON-APAP 5-325: 5-325 | 2 days supply | Qty: 10 | Fill #0

## 2016-03-09 MED FILL — diazePAM 5 MG TABS: 5 | 5 days supply | Qty: 10 | Fill #0

## 2016-03-09 NOTE — ED Notes (Signed)
PA at bedside.

## 2016-03-09 NOTE — ED Provider Notes (Signed)
[facility]   CSN: [id number] Arrival date & time: 03/09/16  1044     History   Chief Complaint Chief Complaint  Patient presents with  . Shortness of Breath  . Back Pain    HPI Breianna Strupp is a 55 y.o. female.  Patient is a 55 year old female with history of bipolar disorder and emphysema who presents the ED with complaint of shortness of breath. Patient reports she has had mild shortness of breath over the past few weeks but notes it has worsened over the past 3 days. She reports having a history of emphysema, reports smoking 3 cigarettes daily and notes that she ran out of her inhalers which were given to her at discharge from the hospital in January when she was admitted for pneumonia and diagnosed with emphysema. Patient also reports having mild intermittent nonproductive cough and mild chest tightness. She notes she has had mild swelling to bilateral legs over the past few weeks. Denies taking any medications for her symptoms. Denies fever, chills, headache, lightheadedness, dizziness, wheezing, chest pain, abdominal pain, nausea, vomiting. Patient denies currently having a PCP.  She also reports having worsening chronic right low back pain which she notes is consistent with her sciatica. Denies any recent fall or injury. Patient reports having constant sharp pain to her right buttocks which she notes radiates down her right posterior thigh. Patient reports pain is worse when ambulating or bending. She notes she has been taking Robaxin at home with mild intermittent relief. Pt denies fever, numbness, tingling, saddle anesthesia, loss of bowel or bladder, weakness, abdominal pain, urinary sxs, IVDU, cancer or recent spinal manipulation.       Past Medical History:  Diagnosis Date  . Bipolar disorder (HCC)   . Emphysema lung (HCC)   . Emphysema of lung (HCC)   . Pneumonia     Patient Active Problem List   Diagnosis Date Noted  . Right hip pain  06/01/2015  . Right-sided low back pain with right-sided sciatica 06/01/2015  . Tobacco dependence 06/01/2015  . Emphysema of lung (HCC) 04/27/2015  . 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 Acute respiratory failure with hypoxia (HCC) 04/25/2015  . CAP (community acquired pneumonia) 04/25/2015  . Hypoxia   . 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 Right knee pain 12/20/2012  . Rash and nonspecific skin eruption 11/28/2012  . Arthritis 11/28/2012  . Leg edema, right 10/14/2012  . Manic depression (HCC) 10/14/2012    Past Surgical History:  Procedure Laterality Date  . APPENDECTOMY    . FINGER AMPUTATION Left     OB History    No data available       Home Medications    Prior to Admission medications   Medication Sig Start Date End Date Taking? Authorizing Provider  aspirin 325 MG tablet Take 325 mg by mouth every 4 (four) hours as needed for pain.   Yes Historical Provider, MD  buPROPion (WELLBUTRIN) 100 MG tablet Take 100 mg by mouth 2 (two) times daily.   Yes Historical Provider, MD  busPIRone (BUSPAR) 5 MG tablet Take 5 mg by mouth 3 (three) times daily.   Yes Historical Provider, MD  divalproex (DEPAKOTE) 250 MG DR tablet Take 750 mg by mouth at bedtime.   Yes Historical Provider, MD  gabapentin (NEURONTIN) 400 MG capsule Take 1 capsule (400 mg total) by mouth 3 (three) times daily. 06/13/15  Yes Massie Maroon, FNP  Melatonin 3 MG TABS Take 2 tablets by mouth at bedtime.  Yes Historical Provider, MD  zolpidem (AMBIEN) 10 MG tablet Take 10 mg by mouth at bedtime as needed for sleep.   Yes Historical Provider, MD  albuterol (PROVENTIL HFA;VENTOLIN HFA) 108 (90 BASE) MCG/ACT inhaler Inhale 2 puffs into the lungs every 6 (six) hours as needed for wheezing or shortness of breath. Patient not taking: Reported on 02/21/2016 04/27/15   Leatha Gildingostin M Gherghe, MD  azithromycin (ZITHROMAX) 250 MG tablet Take 1 tablet (250 mg total) by mouth daily. Take first 2 tablets together, then 1 every day until  finished. Patient not taking: Reported on 02/21/2016 11/05/15   Alvira MondayErin Schlossman, MD  budesonide-formoterol [facility](SYMBICORT) 80-4.5 MCG/ACT inhaler Inhale 2 puffs into the lungs 2 (two) times daily. Patient not taking: Reported on 02/21/2016 11/05/15   Alvira MondayErin Schlossman, MD  diazepam (VALIUM) 5 MG tablet Take 1 tablet (5 mg total) by mouth 2 (two) times daily. 03/09/16   Barrett HenleNicole Elizabeth Tanara Turvey, PA-C  FLUoxetine (PROZAC) 20 MG capsule Take 20 mg by mouth daily.    Historical Provider, MD  HYDROcodone-acetaminophen (NORCO/VICODIN) 5-325 MG tablet Take 1 tablet by mouth every 4 (four) hours as needed. 03/09/16   Barrett HenleNicole Elizabeth Akita Maxim, PA-C  ibuprofen (ADVIL,MOTRIN) 600 MG tablet Take 1 tablet (600 mg total) by mouth every 6 (six) hours as needed. 03/09/16   Barrett HenleNicole Elizabeth Tarin Johndrow, PA-C  methocarbamol (ROBAXIN) 500 MG tablet Take 1 tablet (500 mg total) by mouth 2 (two) times daily. 02/21/16   Henrietta HooverLinda C Bernhardt, NP  naproxen (NAPROSYN) 500 MG tablet TAKE 1 TABLET (500 MG TOTAL) BY MOUTH 2 (TWO) TIMES DAILY WITH A MEAL. 12/30/15   Massie MaroonLachina M Hollis, FNP    Family History Family History  Problem Relation Age of Onset  . Heart disease Mother   . Hyperlipidemia Father     Social History Social History  Substance Use Topics  . Smoking status: Current Every Day Smoker    Packs/day: 0.25    Types: Cigarettes  . Smokeless tobacco: Never Used  . Alcohol use No     Allergies   Flexeril [cyclobenzaprine] and Benadryl [diphenhydramine]   Review of Systems Review of Systems  Respiratory: Positive for cough (mild nonproductive), chest tightness and shortness of breath. Negative for wheezing.   Cardiovascular: Positive for leg swelling (bilateral). Negative for chest pain.  Musculoskeletal: Positive for back pain.  All other systems reviewed and are negative.    Physical Exam Updated Vital Signs BP 98/67 (BP Location: Left Arm)   Pulse 87   Temp 98 F (36.7 C) (Oral)   Resp 16   Ht 5\' 3"  (1.6 m)    Wt 68.9 kg   LMP 05/27/2011   SpO2 92%   BMI 26.93 kg/m   Physical Exam  Constitutional: She is oriented to person, place, and time. She appears well-developed and well-nourished. No distress.  HENT:  Head: Normocephalic and atraumatic.  Mouth/Throat: Uvula is midline, oropharynx is clear and moist and mucous membranes are normal. Abnormal dentition. No oropharyngeal exudate, posterior oropharyngeal edema, posterior oropharyngeal erythema or tonsillar abscesses. No tonsillar exudate.  Eyes: Conjunctivae and EOM are normal. Pupils are equal, round, and reactive to light. Right eye exhibits no discharge. Left eye exhibits no discharge. No scleral icterus.  Neck: Normal range of motion. Neck supple.  Cardiovascular: Normal rate, regular rhythm, normal heart sounds and intact distal pulses.   Pulmonary/Chest: Effort normal and breath sounds normal. No respiratory distress. She has no wheezes. She has no rales. She exhibits no tenderness.  Lung exam  performed s/p albuterol neb tx.  Abdominal: Soft. Bowel sounds are normal. She exhibits no distension and no mass. There is no tenderness. There is no rebound and no guarding. No hernia.  Musculoskeletal: Normal range of motion. She exhibits no edema, tenderness or deformity.  No midline C, T, or L tenderness. Full range of motion of neck and back. Full range of motion of bilateral upper and lower extremities, with 5/5 strength. Sensation intact. 2+ radial and PT pulses. Cap refill <2 seconds. Patient able to stand and ambulate without assistance.  Positive right straight leg raise.  Neurological: She is alert and oriented to person, place, and time. She has normal reflexes. No cranial nerve deficit. Coordination normal.  Skin: Skin is warm and dry. She is not diaphoretic.  Nursing note and vitals reviewed.    ED Treatments / Results  Labs (all labs ordered are listed, but only abnormal results are displayed) Labs Reviewed  CBC WITH  DIFFERENTIAL/PLATELET - Abnormal; Notable for the following:       Result Value   RBC 3.40 (*)    Hemoglobin 10.7 (*)    HCT 32.7 (*)    All other components within normal limits  BASIC METABOLIC PANEL - Abnormal; Notable for the following:    Potassium 3.1 (*)    Glucose, Bld 127 (*)    All other components within normal limits    EKG  EKG Interpretation None       Radiology Dg Chest 2 View  Result Date: 03/09/2016 CLINICAL DATA:  Shortness of breath for 2 weeks.  Cough. EXAM: CHEST  2 VIEW COMPARISON:  11/05/2015 FINDINGS: Normal heart size. No pleural effusion or edema. The lungs are clear. No airspace consolidation. Chronic interstitial coarsening of COPD identified. IMPRESSION: 1. No active cardiopulmonary abnormalities. Electronically Signed   By: Signa Kell M.D.   On: 03/09/2016 11:29    Procedures Procedures (including critical care time)  Medications Ordered in ED Medications  albuterol (PROVENTIL) (2.5 MG/3ML) 0.083% nebulizer solution 2.5 mg (2.5 mg Nebulization Given 03/09/16 1055)  ipratropium-albuterol (DUONEB) 0.5-2.5 (3) MG/3ML nebulizer solution 3 mL (3 mLs Nebulization Given 03/09/16 1055)  methocarbamol (ROBAXIN) injection 1,000 mg (1,000 mg Intramuscular Given 03/09/16 1140)  potassium chloride SA (K-DUR,KLOR-CON) CR tablet 40 mEq (40 mEq Oral Given 03/09/16 1241)  albuterol (PROVENTIL HFA;VENTOLIN HFA) 108 (90 Base) MCG/ACT inhaler 1-2 puff (2 puffs Inhalation Given 03/09/16 1258)     Initial Impression / Assessment and Plan / ED Course  I have reviewed the triage vital signs and the nursing notes.  Pertinent labs & imaging results that were available during my care of the patient were reviewed by me and considered in my medical decision making (see chart for details).  Clinical Course    Asian presents with shortness of breath which he states present for the past week but worsened over the past 3 days. Endorses history of emphysema, reports  smoking daily and notes she ran out of her inhalers at home. Endorses associated mild intermittent nonproductive cough, chest tightness and mild swelling in her legs. Denies fever or chest pain. VSS. Patient was given albuterol treatment prior to my initial evaluation. On exam patient is resting comfortably in bed without any signs of respiratory distress, lungs clear to auscultation bilaterally. Patient reports her breathing has significantly improved after breathing treatment. Chest x-ray negative. Labs revealed potassium 3.1, patient given by mouth potassium supplement in the ED. Suspect patient's shortness of breath is likely related to her emphysema. Patient  denies currently having a PCP. Plan to discharge patient home with albuterol inhaler and symptomatic treatment. Patient given information to establish care with PCP. Discussed return precautions.  Patient also presents with worsening right lower back pain consistent with her history of sciatica. Denies any recent fall or injury. No back pain red flags. Exam revealed mild tenderness over right buttocks, no midline spinal tenderness. Positive right leg raise. No neuro deficits. Patient able to stand and ambulate without assistance. No concern for cauda equina, spinal cord compression or epidural abscess warranting further workup or imaging at this time. Suspect patient's symptoms are likely related to her sciatica. Plan to discharge patient home with pain meds, muscle relaxant and NSAIDs. Advised patient to follow up with PCP for further management and evaluation of her sciatica. Discussed strict return precautions.  Final Clinical Impressions(s) / ED Diagnoses   Final diagnoses:  Shortness of breath  Acute right-sided low back pain with right-sided sciatica    New Prescriptions New Prescriptions   DIAZEPAM (VALIUM) 5 MG TABLET    Take 1 tablet (5 mg total) by mouth 2 (two) times daily.   HYDROCODONE-ACETAMINOPHEN (NORCO/VICODIN) 5-325 MG TABLET     Take 1 tablet by mouth every 4 (four) hours as needed.   IBUPROFEN (ADVIL,MOTRIN) 600 MG TABLET    Take 1 tablet (600 mg total) by mouth every 6 (six) hours as needed.     Satira Sark [place], [place] 03/09/16 1306    Tilden Fossa, MD 03/10/16 [phone]

## 2016-03-09 NOTE — Discharge Instructions (Signed)
Take your medications as prescribed. Use your albuterol inhaler as needed for shortness of breath or wheezing. I recommend refraining from smoking until your symptoms have improved. You may also apply ice to your right buttocks for 15 minutes 3-4 times daily for additional pain relief. Please follow up with a primary care provider from the Resource Guide provided below in 1 week for follow up and further management regarding your emphysema and sciatica.   Please return to the Emergency Department if symptoms worsen or new onset of fever, numbness, tingling, groin anesthesia, loss of bowel or bladder, weakness, abdominal pain, difficulty breathing, chest pain.

## 2016-03-09 NOTE — ED Triage Notes (Signed)
SOB with dry cough since last night. Pt also reports R low back pain with a hx of sciatica.

## 2016-04-10 ENCOUNTER — Other Ambulatory Visit: Payer: Self-pay | Admitting: Family Medicine

## 2016-04-10 ENCOUNTER — Telehealth: Payer: Self-pay

## 2016-04-10 DIAGNOSIS — M62830 Muscle spasm of back: Secondary | ICD-10-CM

## 2016-04-10 MED ORDER — METHOCARBAMOL 500 MG PO TABS: 500.0000 mg | ORAL_TABLET | Freq: Two times a day (BID) | ORAL | 1 refills | Status: DC

## 2016-04-10 NOTE — Telephone Encounter (Signed)
Patient is requesting a refill on robaxin. Please advise. Thanks!

## 2016-06-03 ENCOUNTER — Other Ambulatory Visit: Payer: Self-pay | Admitting: Family Medicine

## 2016-06-03 DIAGNOSIS — M62830 Muscle spasm of back: Secondary | ICD-10-CM

## 2016-08-13 ENCOUNTER — Other Ambulatory Visit: Payer: Self-pay | Admitting: Family Medicine

## 2016-08-13 DIAGNOSIS — M62830 Muscle spasm of back: Secondary | ICD-10-CM

## 2016-08-16 ENCOUNTER — Other Ambulatory Visit: Payer: Self-pay | Admitting: Family Medicine

## 2016-08-16 DIAGNOSIS — M62830 Muscle spasm of back: Secondary | ICD-10-CM

## 2016-08-19 ENCOUNTER — Telehealth: Payer: Self-pay

## 2016-08-27 ENCOUNTER — Encounter: Payer: Self-pay | Admitting: Family Medicine

## 2016-08-27 ENCOUNTER — Ambulatory Visit (INDEPENDENT_AMBULATORY_CARE_PROVIDER_SITE_OTHER): Payer: Self-pay | Admitting: Family Medicine

## 2016-08-27 VITALS — BP 110/71 | HR 76 | Temp 97.9°F | Resp 14 | Ht 64.0 in | Wt 150.0 lb

## 2016-08-27 DIAGNOSIS — M1611 Unilateral primary osteoarthritis, right hip: Secondary | ICD-10-CM

## 2016-08-27 DIAGNOSIS — E876 Hypokalemia: Secondary | ICD-10-CM

## 2016-08-27 DIAGNOSIS — Z1159 Encounter for screening for other viral diseases: Secondary | ICD-10-CM | POA: Insufficient documentation

## 2016-08-27 DIAGNOSIS — F17200 Nicotine dependence, unspecified, uncomplicated: Secondary | ICD-10-CM

## 2016-08-27 DIAGNOSIS — M62830 Muscle spasm of back: Secondary | ICD-10-CM

## 2016-08-27 DIAGNOSIS — Z23 Encounter for immunization: Secondary | ICD-10-CM

## 2016-08-27 DIAGNOSIS — D649 Anemia, unspecified: Secondary | ICD-10-CM

## 2016-08-27 LAB — CBC WITH DIFFERENTIAL/PLATELET
BASOS ABS: 76 {cells}/uL (ref 0–200)
Basophils Relative: 1 %
EOS ABS: 304 {cells}/uL (ref 15–500)
Eosinophils Relative: 4 %
HEMATOCRIT: 36.9 % (ref 35.0–45.0)
HEMOGLOBIN: 12 g/dL (ref 11.7–15.5)
LYMPHS ABS: 2584 {cells}/uL (ref 850–3900)
Lymphocytes Relative: 34 %
MCH: 31.5 pg (ref 27.0–33.0)
MCHC: 32.5 g/dL (ref 32.0–36.0)
MCV: 96.9 fL (ref 80.0–100.0)
MONO ABS: 684 {cells}/uL (ref 200–950)
MPV: 10.6 fL (ref 7.5–12.5)
Monocytes Relative: 9 %
NEUTROS PCT: 52 %
Neutro Abs: 3952 cells/uL (ref 1500–7800)
Platelets: 199 10*3/uL (ref 140–400)
RBC: 3.81 MIL/uL (ref 3.80–5.10)
RDW: 15.2 % — ABNORMAL HIGH (ref 11.0–15.0)
WBC: 7.6 10*3/uL (ref 3.8–10.8)

## 2016-08-27 LAB — BASIC METABOLIC PANEL WITH GFR
BUN: 15 mg/dL (ref 7–25)
CALCIUM: 8.6 mg/dL (ref 8.6–10.4)
CHLORIDE: 111 mmol/L — AB (ref 98–110)
CO2: 21 mmol/L (ref 20–31)
CREATININE: 1.14 mg/dL — AB (ref 0.50–1.05)
GFR, Est African American: 63 mL/min (ref >=60)
GFR, Est Non African American: 55 mL/min — ABNORMAL LOW (ref >=60)
GLUCOSE: 63 mg/dL — AB (ref 65–99)
Potassium: 4.3 mmol/L (ref 3.5–5.3)
Sodium: 143 mmol/L (ref 135–146)

## 2016-08-27 MED ORDER — DICLOFENAC SODIUM 75 MG PO TBEC: 75.0000 mg | DELAYED_RELEASE_TABLET | Freq: Two times a day (BID) | ORAL | 1 refills | Status: DC

## 2016-08-27 MED ORDER — METHOCARBAMOL 500 MG PO TABS: 500.0000 mg | ORAL_TABLET | Freq: Two times a day (BID) | ORAL | 1 refills | Status: DC

## 2016-08-27 MED ORDER — KETOROLAC TROMETHAMINE 60 MG/2ML IM SOLN
60.0000 mg | Freq: Once | INTRAMUSCULAR | Status: AC
  Administered 2016-08-27: 60 mg via INTRAMUSCULAR

## 2016-08-27 NOTE — Patient Instructions (Addendum)
Will continue Robaxin 500 mg BID for chronic back spasms.   Will start a Trial of Diclofenac 75 mg twice daily for right hip osteoarthritis.   Recommend applying warm, moist compresses to right hip as needed.  Follow up in 3 months for pap smear  Chronic Pain, Adult Chronic pain is a type of pain that lasts or keeps coming back (recurs) for at least six months. You may have chronic headaches, abdominal pain, or body pain. Chronic pain may be related to an illness, such as fibromyalgia or complex regional pain syndrome. Sometimes the cause of chronic pain is not known. Chronic pain can make it hard for you to do daily activities. If not treated, chronic pain can lead to other health problems, including anxiety and depression. Treatment depends on the cause and severity of your pain. You may need to work with a pain specialist to come up with a treatment plan. The plan may include medicine, counseling, and physical therapy. Many people benefit from a combination of two or more types of treatment to control their pain. Follow these instructions at home: Lifestyle   Consider keeping a pain diary to share with your health care providers.  Consider talking with a mental health care provider (psychologist) about how to cope with chronic pain.  Consider joining a chronic pain support group.  Try to control or lower your stress levels. Talk to your health care provider about strategies to do this. General instructions    Take over-the-counter and prescription medicines only as told by your health care provider.  Follow your treatment plan as told by your health care provider. This may include:  Gentle, regular exercise.  Eating a healthy diet that includes foods such as vegetables, fruits, fish, and lean meats.  Cognitive or behavioral therapy.  Working with a Adult nurse.  Meditation or yoga.  Acupuncture or massage therapy.  Aroma, color, light, or sound therapy.  Local  electrical stimulation.  Shots (injections) of numbing or pain-relieving medicines into the spine or the area of pain.  Check your pain level as told by your health care provider. Ask your health care provider if you should use a pain scale.  Learn as much as you can about how to manage your chronic pain. Ask your health care provider if an intensive pain rehabilitation program or a chronic pain specialist would be helpful.  Keep all follow-up visits as told by your health care provider. This is important. Contact a health care provider if:  Your pain gets worse.  You have new pain.  You have trouble sleeping.  You have trouble doing your normal activities.  Your pain is not controlled with treatment.  Your have side effects from pain medicine.  You feel weak. Get help right away if:  You lose feeling or have numbness in your body.  You lose control of bowel or bladder function.  Your pain suddenly gets much worse.  You develop shaking or chills.  You develop confusion.  You develop chest pain.  You have trouble breathing or shortness of breath.  You pass out.  You have thoughts about hurting yourself or others. This information is not intended to replace advice given to you by your health care provider. Make sure you discuss any questions you have with your health care provider. Document Released: 01/31/2002 Document Revised: 01/09/2016 Document Reviewed: 10/29/2015 Elsevier Interactive Patient Education  2017 [employer]  Musculoskeletal Pain Musculoskeletal pain is muscle and bone aches and pains. This pain can  occur in any part of the body. Follow these instructions at home:  Only take medicines for pain, discomfort, or fever as told by your health care provider.  You may continue all activities unless the activities cause more pain. When the pain lessens, slowly resume normal activities. Gradually increase the intensity and duration of the activities or  exercise.  During periods of severe pain, bed rest may be helpful. Lie or sit in any position that is comfortable, but get out of bed and walk around at least every several hours.  If directed, put ice on the injured area.  Put ice in a plastic bag.  Place a towel between your skin and the bag.  Leave the ice on for 20 minutes, 2-3 times a day. Contact a health care provider if:  Your pain is getting worse.  Your pain is not relieved with medicines.  You lose function in the area of the pain if the pain is in your arms, legs, or neck. This information is not intended to replace advice given to you by your health care provider. Make sure you discuss any questions you have with your health care provider. Document Released: 05/11/2005 Document Revised: 10/22/2015 Document Reviewed: 01/13/2013 Elsevier Interactive Patient Education  2017 [employer]  Muscle Cramps and Spasms Muscle cramps and spasms occur when a muscle or muscles tighten and you have no control over this tightening (involuntary muscle contraction). They are a common problem and can develop in any muscle. The most common place is in the calf muscles of the leg. Muscle cramps and muscle spasms are both involuntary muscle contractions, but there are some differences between the two:  Muscle cramps are painful. They come and go and may last a few seconds to 15 minutes. Muscle cramps are often more forceful and last longer than muscle spasms.  Muscle spasms may or may not be painful. They may also last just a few seconds or much longer. Certain medical conditions, such as diabetes or Parkinson disease, can make it more likely to develop cramps or spasms. However, cramps or spasms are usually not caused by a serious underlying problem. Common causes include:  Overexertion.  Overuse from repetitive motions, or doing the same thing over and over.  Remaining in a certain position for a long period of time.  Improper  preparation, form, or technique while playing a sport or doing an activity.  Dehydration.  Injury.  Side effects of some medicines.  Abnormally low levels of the salts and ions in your blood (electrolytes), especially potassium and calcium. This could happen if you are taking water pills (diuretics) or if you are pregnant. In many cases, the cause of muscle cramps or spasms is unknown. Follow these instructions at home:  Stay well hydrated. Drink enough fluid to keep your urine clear or pale yellow.  Try massaging, stretching, and relaxing the affected muscle.  If directed, apply heat to tight or tense muscles as often as told by your health care provider. Use the heat source that your health care provider recommends, such as a moist heat pack or a heating pad.  Place a towel between your skin and the heat source.  Leave the heat on for 20-30 minutes.  Remove the heat if your skin turns bright red. This is especially important if you are unable to feel pain, heat, or cold. You may have a greater risk of getting burned.  If directed, put ice on the affected area. This may help if you  are sore or have pain after a cramp or spasm.  Put ice in a plastic bag.  Place a towel between your skin and the bag.  Leavethe ice on for 20 minutes, 2-3 times a day.  Take over-the-counter and prescription medicines only as told by your health care provider.  Pay attention to any changes in your symptoms. Contact a health care provider if:  Your cramps or spasms get more severe or happen more often.  Your cramps or spasms do not improve over time. This information is not intended to replace advice given to you by your health care provider. Make sure you discuss any questions you have with your health care provider. Document Released: 10/31/2001 Document Revised: 06/12/2015 Document Reviewed: 02/12/2015 Elsevier Interactive Patient Education  2017 [employer].

## 2016-08-27 NOTE — Progress Notes (Signed)
Subjective:    Patient ID: Holly Cortez, female    DOB: 05/15/1961, 56 y.o.   MRN: [id number]  HPI [name], a 56 year old female with a history of emphysema, bipolar depression, and right hip pain presents complaining pain primarily to right hip and low back. She says that she continues to have spasms to low back. Spasms were previously controlled on Robaxin. She has been out of medications. The onset of symptoms was greater than 1 year ago.  She has not identified an inciting event.  Aggravating symptoms: inactivity, kneeling, lateral movements, pivoting, squatting and standing. Symptoms have progressed over the past several. Current pain intensity is 8/10. She describes pain as throbbing and radiating to right thigh.  Pain is minimally controlled on Gabapentin. She states that she had satisfactory pain relief with last Toradol injection received in the past.  She denies fever, fatigue, constipation, dysuria, bowel or bladder incontinence.  Past Medical History:  Diagnosis Date  . Bipolar disorder (HCC)   . Emphysema lung (HCC)   . Emphysema of lung (HCC)   . Pneumonia    Social History   Social History  . Marital status: Single    Spouse name: N/A  . Number of children: N/A  . Years of education: N/A   Occupational History  . Not on file.   Social History Main Topics  . Smoking status: Current Every Day Smoker    Packs/day: 0.25    Types: Cigarettes  . Smokeless tobacco: Never Used  . Alcohol use No  . Drug use: No  . Sexual activity: Not on file   Other Topics Concern  . Not on file   Social History Narrative  . No narrative on file   Immunization History  Administered Date(s) Administered  . Influenza,inj,Quad PF,36+ Mos 04/27/2015  . Pneumococcal Polysaccharide-23 04/27/2015   Allergies  Allergen Reactions  . Flexeril [Cyclobenzaprine] Other (See Comments)    "Makes my legs crawl"  . Benadryl [Diphenhydramine] Palpitations   Review of Systems   Constitutional: Negative for fatigue, fever and unexpected weight change.  HENT: Positive for dental problem.   Respiratory: Negative.  Negative for apnea, chest tightness, shortness of breath and wheezing.   Cardiovascular: Negative.   Gastrointestinal: Negative.  Negative for abdominal distention, abdominal pain, constipation and nausea.  Endocrine: Negative for cold intolerance, heat intolerance, polydipsia and polyuria.  Genitourinary: Negative.  Negative for hematuria.  Musculoskeletal: Positive for myalgias (right hip pain).  Skin: Negative.   Neurological: Negative for dizziness, weakness and headaches.  Hematological: Negative.   Psychiatric/Behavioral: Negative for behavioral problems, decreased concentration, hallucinations and suicidal ideas.       Objective:   Physical Exam  Constitutional: She is oriented to person, place, and time. She appears well-developed and well-nourished.  HENT:  Head: Normocephalic and atraumatic.  Right Ear: External ear normal.  Left Ear: External ear normal.  Mouth/Throat: Oropharynx is clear and moist. Abnormal dentition (partially edentulous). Dental caries present.  Eyes: Conjunctivae and EOM are normal. Pupils are equal, round, and reactive to light.  Neck: Normal range of motion. Neck supple.  Cardiovascular: Normal rate, regular rhythm, normal heart sounds and intact distal pulses.   Pulmonary/Chest: Effort normal and breath sounds normal.  Abdominal: Soft. Bowel sounds are normal.  Musculoskeletal:       Right hip: She exhibits decreased range of motion and decreased strength. She exhibits no tenderness and no swelling.  Neurological: She is alert and oriented to person, place, and time.  She has normal reflexes.  Skin: Skin is warm and dry.  Psychiatric: She has a normal mood and affect. Her behavior is normal. Judgment and thought content normal.     BP 110/71 (BP Location: Left Arm, Patient Position: Sitting, Cuff Size: Normal)    Pulse 76   Temp 97.9 F (36.6 C) (Oral)   Resp 14   Ht  (1.626 m)   Wt 150 lb (68 kg)   LMP 05/27/2011   SpO2 100%   BMI 25.75 kg/m  Assessment & Plan:   1. Back muscle spasm - methocarbamol (ROBAXIN) 500 MG tablet; Take 1 tablet (500 mg total) by mouth 2 (two) times daily.  Dispense: 60 tablet; Refill: 1  2. Primary osteoarthritis of right hip Patient has a history of osteoarthritis. Previous sedimentation rate was elevated, will repeat on today.  Previous hip xray, shows marginal osteophytes suggesting mild osteoarthritis of the right hip. - diclofenac (VOLTAREN) 75 MG EC tablet; Take 1 tablet (75 mg total) by mouth 2 (two) times daily.  Dispense: 30 tablet; Refill: 1 - ketorolac (TORADOL) injection 60 mg; Inject 2 mLs (60 mg total) into the muscle once.  3. Tobacco dependence Smoking cessation instruction/counseling given:  counseled patient on the dangers of tobacco use, advised patient to stop smoking, and reviewed strategies to maximize success  4. Anemia, unspecified type - CBC with Differential  5. Need for hepatitis C screening test - Hepatitis C Antibody  6. Need for Tdap vaccination - Tdap vaccine greater than or equal to 7yo IM  7. Hypokalemia - BASIC METABOLIC PANEL WITH GFR  RTC: 1 month for pap smear      The patient was given clear instructions to go to ER or return to medical center if symptoms do not improve, worsen or new problems develop. The patient verbalized understanding. Will notify patient with laboratory results.  [name]  MSN, FNP-C [facility] [address] [place], [place] [address] [phone]

## 2016-08-28 ENCOUNTER — Telehealth: Payer: Self-pay

## 2016-08-28 ENCOUNTER — Other Ambulatory Visit: Payer: Self-pay | Admitting: Family Medicine

## 2016-08-28 DIAGNOSIS — R7989 Other specified abnormal findings of blood chemistry: Secondary | ICD-10-CM

## 2016-08-28 LAB — HEPATITIS C ANTIBODY: HCV AB: NEGATIVE

## 2016-08-28 NOTE — Telephone Encounter (Signed)
Spoke with patient advised of abnormal labs and the need to avoid Nsaids otc like aleve, ibuprofen, naproxen. Appointment was scheduled for 1 month on 10/02/2016 for labs.

## 2016-08-28 NOTE — Telephone Encounter (Signed)
-----   Message from Massie Maroon, [place] sent at 08/28/2016  2:23 PM EDT ----- Regarding: lab results Please inform Holly Cortez that creatinine is elevated, which is an indicator of kidney functioning. Refrain from taking OTC NSAids (Aleve, Ibuprofen, Naproxen). Have her schedule a lab appointment to check kidney functioning in 1 month.   Thanks

## 2016-09-26 ENCOUNTER — Emergency Department (HOSPITAL_BASED_OUTPATIENT_CLINIC_OR_DEPARTMENT_OTHER)
Admission: EM | Admit: 2016-09-26 | Discharge: 2016-09-26 | Disposition: A | Discharge status: Home / Self Care | Payer: Self-pay | Attending: Dermatology | Admitting: Dermatology

## 2016-09-26 ENCOUNTER — Encounter (HOSPITAL_BASED_OUTPATIENT_CLINIC_OR_DEPARTMENT_OTHER): Payer: Self-pay | Admitting: *Deleted

## 2016-09-26 DIAGNOSIS — Z5321 Procedure and treatment not carried out due to patient leaving prior to being seen by health care provider: Secondary | ICD-10-CM | POA: Insufficient documentation

## 2016-09-26 DIAGNOSIS — F17210 Nicotine dependence, cigarettes, uncomplicated: Secondary | ICD-10-CM | POA: Insufficient documentation

## 2016-09-26 DIAGNOSIS — R0602 Shortness of breath: Secondary | ICD-10-CM | POA: Insufficient documentation

## 2016-09-26 NOTE — ED Triage Notes (Signed)
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 Feels more short of breath since. NP cough. Also c/o dizziness at times while working ion the heat. Denies other s/s. BBS clr. Mild dyspnea with exertion. "Feel tired."

## 2016-09-26 NOTE — ED Notes (Signed)
Called out to lobby by EMT, pt stating that she wants to leave and go home, put on her oxygen, she thinks she may have just has some panic problems. Encouraged her to stay, noting we have a room to take her to be evaluated, pt reports she will f/u with her doctor and will leave. Discussed please return if she would like

## 2016-09-26 NOTE — ED Notes (Signed)
Pt breath sounds where clear and diminished. SATs 98% on room air. Pt in no apparent distress at this time. Informed pt if she felt worse to let nurse first know.

## 2016-10-02 ENCOUNTER — Other Ambulatory Visit (INDEPENDENT_AMBULATORY_CARE_PROVIDER_SITE_OTHER): Payer: Self-pay

## 2016-10-02 DIAGNOSIS — R7989 Other specified abnormal findings of blood chemistry: Secondary | ICD-10-CM

## 2016-10-02 LAB — BASIC METABOLIC PANEL WITH GFR
BUN: 12 mg/dL (ref 7–25)
CO2: 23 mmol/L (ref 20–31)
Calcium: 8.7 mg/dL (ref 8.6–10.4)
Chloride: 112 mmol/L — ABNORMAL HIGH (ref 98–110)
Creat: 1.06 mg/dL — ABNORMAL HIGH (ref 0.50–1.05)
GFR, EST AFRICAN AMERICAN: 69 mL/min (ref >=60)
GFR, EST NON AFRICAN AMERICAN: 60 mL/min (ref >=60)
Glucose, Bld: 70 mg/dL (ref 65–99)
POTASSIUM: 3.8 mmol/L (ref 3.5–5.3)
SODIUM: 145 mmol/L (ref 135–146)

## 2016-11-26 ENCOUNTER — Encounter: Payer: Self-pay | Admitting: Family Medicine

## 2016-11-26 ENCOUNTER — Ambulatory Visit (INDEPENDENT_AMBULATORY_CARE_PROVIDER_SITE_OTHER): Payer: Self-pay | Admitting: Family Medicine

## 2016-11-26 VITALS — BP 105/69 | HR 85 | Temp 98.3°F | Resp 14 | Ht 64.0 in | Wt 148.0 lb

## 2016-11-26 DIAGNOSIS — M7918 Myalgia, other site: Principal | ICD-10-CM

## 2016-11-26 DIAGNOSIS — F17200 Nicotine dependence, unspecified, uncomplicated: Secondary | ICD-10-CM

## 2016-11-26 DIAGNOSIS — M791 Myalgia: Secondary | ICD-10-CM

## 2016-11-26 DIAGNOSIS — M5441 Lumbago with sciatica, right side: Secondary | ICD-10-CM

## 2016-11-26 DIAGNOSIS — R809 Proteinuria, unspecified: Secondary | ICD-10-CM

## 2016-11-26 DIAGNOSIS — R7989 Other specified abnormal findings of blood chemistry: Secondary | ICD-10-CM

## 2016-11-26 DIAGNOSIS — G8929 Other chronic pain: Secondary | ICD-10-CM

## 2016-11-26 LAB — BASIC METABOLIC PANEL
BUN: 15 mg/dL (ref 7–25)
CALCIUM: 8.6 mg/dL (ref 8.6–10.4)
CO2: 23 mmol/L (ref 20–31)
Chloride: 109 mmol/L (ref 98–110)
Creat: 0.99 mg/dL (ref 0.50–1.05)
Glucose, Bld: 68 mg/dL (ref 65–99)
POTASSIUM: 4.2 mmol/L (ref 3.5–5.3)
SODIUM: 141 mmol/L (ref 135–146)

## 2016-11-26 MED ORDER — PREGABALIN 50 MG PO CAPS: 50.0000 mg | ORAL_CAPSULE | Freq: Three times a day (TID) | ORAL | 2 refills | Status: DC

## 2016-11-26 MED ORDER — KETOROLAC TROMETHAMINE 30 MG/ML IJ SOLN
30.0000 mg | Freq: Once | INTRAMUSCULAR | Status: AC
  Administered 2016-11-26: 30 mg via INTRAMUSCULAR

## 2016-11-26 NOTE — Progress Notes (Signed)
Subjective:    Patient ID: Holly Cortez, female    DOB: 06/16/1960, 56 y.o.   MRN: [id number]  HPI [name], a 56 year old female with a history of emphysema, bipolar depression, and right hip pain presents complaining pain primarily to right hip and low back. She says that she continues to have spasms to low back. She also has sciatica primarily to let leg. . The onset of symptoms was greater than 1 year ago.  She has not identified an inciting event.  Aggravating symptoms: inactivity, kneeling, lateral movements, pivoting, squatting and standing. Symptoms have progressed over the past several. Current pain intensity is 7/10. She describes pain as throbbing and radiating to right thigh.  Pain is minimally controlled on Gabapentin. She states that she had satisfactory pain relief with last Toradol injection.  She denies fever, fatigue, constipation, dysuria, bowel or bladder incontinence.  Past Medical History:  Diagnosis Date  . Bipolar disorder (HCC)   . Emphysema lung (HCC)   . Emphysema of lung (HCC)   . Pneumonia    Social History   Social History  . Marital status: Single    Spouse name: N/A  . Number of children: N/A  . Years of education: N/A   Occupational History  . Not on file.   Social History Main Topics  . Smoking status: Current Every Day Smoker    Packs/day: 0.25    Types: Cigarettes  . Smokeless tobacco: Never Used  . Alcohol use No  . Drug use: No  . Sexual activity: Not on file   Other Topics Concern  . Not on file   Social History Narrative  . No narrative on file   Immunization History  Administered Date(s) Administered  . Influenza,inj,Quad PF,36+ Mos 04/27/2015  . Pneumococcal Polysaccharide-23 04/27/2015  . Tdap 08/27/2016   Allergies  Allergen Reactions  . Flexeril [Cyclobenzaprine] Other (See Comments)    "Makes my legs crawl"  . Benadryl [Diphenhydramine] Palpitations   Review of Systems  Constitutional: Negative for  fatigue, fever and unexpected weight change.  HENT: Positive for dental problem.   Respiratory: Negative.  Negative for apnea, chest tightness, shortness of breath and wheezing.   Cardiovascular: Negative.   Gastrointestinal: Negative.  Negative for abdominal distention, abdominal pain, constipation and nausea.  Endocrine: Negative for cold intolerance, heat intolerance, polydipsia and polyuria.  Genitourinary: Negative.  Negative for hematuria.  Musculoskeletal: Positive for myalgias (right hip pain).  Skin: Negative.   Neurological: Negative for dizziness, weakness and headaches.  Hematological: Negative.   Psychiatric/Behavioral: Negative for behavioral problems, decreased concentration, hallucinations and suicidal ideas.       Objective:   Physical Exam  Constitutional: She is oriented to person, place, and time. She appears well-developed and well-nourished.  HENT:  Head: Normocephalic and atraumatic.  Right Ear: External ear normal.  Left Ear: External ear normal.  Mouth/Throat: Oropharynx is clear and moist. Abnormal dentition (partially edentulous). Dental caries present.  Eyes: Conjunctivae and EOM are normal. Pupils are equal, round, and reactive to light.  Neck: Normal range of motion. Neck supple.  Cardiovascular: Normal rate, regular rhythm, normal heart sounds and intact distal pulses.   Pulmonary/Chest: Effort normal and breath sounds normal.  Abdominal: Soft. Bowel sounds are normal.  Musculoskeletal:       Right hip: She exhibits decreased range of motion and decreased strength. She exhibits no tenderness and no swelling.       Lumbar back: She exhibits decreased range of motion and  pain.  Neurological: She is alert and oriented to person, place, and time. She has normal reflexes.  Skin: Skin is warm and dry.  Psychiatric: She has a normal mood and affect. Her behavior is normal. Judgment and thought content normal.     BP 105/69 (BP Location: Right Arm, Patient  Position: Sitting, Cuff Size: Normal)   Pulse 85   Temp 98.3 F (36.8 C) (Oral)   Resp 14   Ht 5\' 4"  (1.626 m)   Wt 148 lb (67.1 kg)   LMP 05/27/2011   SpO2 100%   BMI 25.40 kg/m  Assessment & Plan:  1. Chronic right-sided low back pain with right-sided sciatica [name] has been taking gabapentin 300 mg BID without sustained relief. Current pain intensity is 7/10, will administer Toradol IM. Also, will start a trial of Lyrica 50 mg BID. Sh - ketorolac (TORADOL) 30 MG/ML injection 30 mg; Inject 1 mL (30 mg total) into the muscle once. - POCT urinalysis dip (device)  2. Chronic gluteal pain Patient has a history of osteoarthritis. Previous sedimentation rate was elevated, will repeat on today.  Previous hip xray, shows marginal osteophytes suggesting mild osteoarthritis of the right hip. Previously prescribed Dicolfenac without satisfactory pain relief.  - ketorolac (TORADOL) 30 MG/ML injection 30 mg; Inject 1 mL (30 mg total) into the muscle once.  3. Elevated serum creatinine Previous creatinine was 1.06, decreased NSAIDs. I will recheck creatinine today.  - Basic Metabolic Panel  4. Proteinuria, unspecified type Reviewed previous urinalysis,proteinuria present.  - POCT urinalysis dip (device)  5. Tobacco dependence Smoking cessation instruction/counseling given:  counseled patient on the dangers of tobacco use, advised patient to stop smoking, and reviewed strategies to maximize success    RTC: 3 months for pap smear. I will also follow up by phone with any abnormal laboratory results.   [name]  MSN, FNP-C [facility] Patient [facility] [address] [place], [place] [address] [phone]

## 2016-11-26 NOTE — Patient Instructions (Addendum)
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 Refrain from drinking alcohol, driving, or operating machinery while taking this medication.    Will follow up by phone with any abnormal laboratory results   Continue to work on smoking cessation as discussed during appointment.    Pregabalin capsules What is this medicine? PREGABALIN (pre GAB a lin) is used to treat nerve pain from diabetes, shingles, spinal cord injury, and fibromyalgia. It is also used to control seizures in epilepsy. This medicine may be used for other purposes; ask your health care provider or pharmacist if you have questions. COMMON BRAND NAME(S): Lyrica What should I tell my health care provider before I take this medicine? They need to know if you have any of these conditions: -bleeding problems -heart disease, including heart failure -history of alcohol or drug abuse -kidney disease -suicidal thoughts, plans, or attempt; a previous suicide attempt by you or a family member -an unusual or allergic reaction to pregabalin, gabapentin, other medicines, foods, dyes, or preservatives -pregnant or trying to get pregnant or trying to conceive with your partner -breast-feeding How should I use this medicine? Take this medicine by mouth with a glass of water. Follow the directions on the prescription label. You can take this medicine with or without food. Take your doses at regular intervals. Do not take your medicine more often than directed. Do not stop taking except on your doctor's advice. A special MedGuide will be given to you by the pharmacist with each prescription and refill. Be sure to read this information carefully each time. Talk to your pediatrician regarding the use of this medicine in children. Special care may be needed. Overdosage: If you think you have taken too much of this medicine contact  a poison control center or emergency room at once. NOTE: This medicine is only for you. Do not share this medicine with others. What if I miss a dose? If you miss a dose, take it as soon as you can. If it is almost time for your next dose, take only that dose. Do not take double or extra doses. What may interact with this medicine? -alcohol -certain medicines for blood pressure like captopril, enalapril, or lisinopril -certain medicines for diabetes, like pioglitazone or rosiglitazone -certain medicines for anxiety or sleep -narcotic medicines for pain This list may not describe all possible interactions. Give your health care provider a list of all the medicines, herbs, non-prescription drugs, or dietary supplements you use. Also tell them if you smoke, drink alcohol, or use illegal drugs. Some items may interact with your medicine. What should I watch for while using this medicine? Tell your doctor or healthcare professional if your symptoms do not start to get better or if they get worse. Visit your doctor or health care professional for regular checks on your progress. Do not stop taking except on your doctor's advice. You may develop a severe reaction. Your doctor will tell you how much medicine to take. Wear a medical identification bracelet or chain if you are taking this medicine for seizures, and carry a card that describes your disease and details of your medicine and dosage times. You may get drowsy or dizzy. Do not drive, use machinery, or do anything that needs mental alertness until you know how this medicine affects you. Do not stand or sit up quickly, especially if you are an older patient. This reduces  the risk of dizzy or fainting spells. Alcohol may interfere with the effect of this medicine. Avoid alcoholic drinks. If you have a heart condition, like congestive heart failure, and notice that you are retaining water and have swelling in your hands or feet, contact your health care  provider immediately. The use of this medicine may increase the chance of suicidal thoughts or actions. Pay special attention to how you are responding while on this medicine. Any worsening of mood, or thoughts of suicide or dying should be reported to your health care professional right away. This medicine has caused reduced sperm counts in some men. This may interfere with the ability to father a child. You should talk to your doctor or health care professional if you are concerned about your fertility. Women who become pregnant while using this medicine for seizures may enroll in the [place] Antiepileptic Drug Pregnancy Registry by calling [phone]. This registry collects information about the safety of antiepileptic drug use during pregnancy. What side effects may I notice from receiving this medicine? Side effects that you should report to your doctor or health care professional as soon as possible: -allergic reactions like skin rash, itching or hives, swelling of the face, lips, or tongue -breathing problems -changes in vision -chest pain -confusion -jerking or unusual movements of any part of your body -loss of memory -muscle pain, tenderness, or weakness -suicidal thoughts or other mood changes -swelling of the ankles, feet, hands -unusual bruising or bleeding Side effects that usually do not require medical attention (report to your doctor or health care professional if they continue or are bothersome): -dizziness -drowsiness -dry mouth -headache -nausea -tremors -trouble sleeping -weight gain This list may not describe all possible side effects. Call your doctor for medical advice about side effects. You may report side effects to FDA at 1-800-FDA-1088. Where should I keep my medicine? Keep out of the reach of children. This medicine can be abused. Keep your medicine in a safe place to protect it from theft. Do not share this medicine with anyone. Selling or giving  away this medicine is dangerous and against the law. This medicine may cause accidental overdose and death if it taken by other adults, children, or pets. Mix any unused medicine with a substance like cat litter or coffee grounds. Then throw the medicine away in a sealed container like a sealed bag or a coffee can with a lid. Do not use the medicine after the expiration date. Store at room temperature between 15 and 30 degrees C (59 and 86 degrees F). NOTE: This sheet is a summary. It may not cover all possible information. If you have questions about this medicine, talk to your doctor, pharmacist, or health care provider.  2018 Elsevier/Gold Standard (2015-06-13 10:26:12)

## 2016-11-27 ENCOUNTER — Other Ambulatory Visit: Payer: Self-pay | Admitting: Family Medicine

## 2016-11-27 ENCOUNTER — Telehealth: Payer: Self-pay

## 2016-11-27 DIAGNOSIS — G8929 Other chronic pain: Secondary | ICD-10-CM

## 2016-11-27 DIAGNOSIS — M5441 Lumbago with sciatica, right side: Principal | ICD-10-CM

## 2016-11-27 LAB — POCT URINALYSIS DIP (DEVICE)
BILIRUBIN URINE: NEGATIVE
Glucose, UA: NEGATIVE mg/dL
HGB URINE DIPSTICK: NEGATIVE
NITRITE: NEGATIVE
Protein, ur: NEGATIVE mg/dL
Specific Gravity, Urine: >=1.03 (ref 1.005–1.030)
Urobilinogen, UA: 0.2 mg/dL (ref 0.0–1.0)
pH: 5.5 (ref 5.0–8.0)

## 2016-11-27 MED ORDER — GABAPENTIN 400 MG PO CAPS: 400.0000 mg | ORAL_CAPSULE | Freq: Three times a day (TID) | ORAL | 5 refills | Status: DC

## 2016-11-27 NOTE — Progress Notes (Signed)
Called, no answer. Left message for patient to return call. Thanks!  

## 2016-11-27 NOTE — Telephone Encounter (Signed)
Armeniahina,  Please advise if there is something cheaper for patient to try. Thanks!

## 2016-11-27 NOTE — Progress Notes (Signed)
Meds ordered this encounter  Medications  . gabapentin (NEURONTIN) 400 MG capsule    Sig: Take 1 capsule (400 mg total) by mouth 3 (three) times daily.    Dispense:  90 capsule    Refill:  5    Holly NationsLaChina Moore Jamieson Hetland  MSN, FNP-C [facility] [address]  [place], [place] [id number] [phone]

## 2016-11-30 ENCOUNTER — Telehealth: Payer: Self-pay

## 2016-11-30 ENCOUNTER — Other Ambulatory Visit: Payer: Self-pay

## 2016-11-30 DIAGNOSIS — M5441 Lumbago with sciatica, right side: Principal | ICD-10-CM

## 2016-11-30 DIAGNOSIS — G8929 Other chronic pain: Secondary | ICD-10-CM

## 2016-11-30 MED ORDER — GABAPENTIN 400 MG PO CAPS: 400.0000 mg | ORAL_CAPSULE | Freq: Three times a day (TID) | ORAL | 5 refills | Status: DC

## 2016-11-30 NOTE — Telephone Encounter (Signed)
See previous message

## 2016-11-30 NOTE — Progress Notes (Signed)
Called and spoke with patient and advised that the lyrica was changed to gabapentin 400mg  three times daily as directed. Patient [facility]. [facility]. Thanks!

## 2017-02-26 ENCOUNTER — Ambulatory Visit: Payer: Self-pay | Admitting: Family Medicine

## 2017-03-01 ENCOUNTER — Ambulatory Visit: Payer: Self-pay | Admitting: Family Medicine

## 2018-02-01 ENCOUNTER — Emergency Department (HOSPITAL_BASED_OUTPATIENT_CLINIC_OR_DEPARTMENT_OTHER)
Admission: EM | Admit: 2018-02-01 | Discharge: 2018-02-02 | Disposition: A | Discharge status: Home / Self Care | Payer: Self-pay | Attending: Emergency Medicine | Admitting: Emergency Medicine

## 2018-02-01 ENCOUNTER — Encounter (HOSPITAL_BASED_OUTPATIENT_CLINIC_OR_DEPARTMENT_OTHER): Payer: Self-pay

## 2018-02-01 ENCOUNTER — Emergency Department (HOSPITAL_BASED_OUTPATIENT_CLINIC_OR_DEPARTMENT_OTHER): Payer: Self-pay

## 2018-02-01 ENCOUNTER — Other Ambulatory Visit: Payer: Self-pay

## 2018-02-01 DIAGNOSIS — J449 Chronic obstructive pulmonary disease, unspecified: Secondary | ICD-10-CM | POA: Insufficient documentation

## 2018-02-01 DIAGNOSIS — J40 Bronchitis, not specified as acute or chronic: Secondary | ICD-10-CM | POA: Insufficient documentation

## 2018-02-01 DIAGNOSIS — Z79899 Other long term (current) drug therapy: Secondary | ICD-10-CM | POA: Insufficient documentation

## 2018-02-01 DIAGNOSIS — F17210 Nicotine dependence, cigarettes, uncomplicated: Secondary | ICD-10-CM | POA: Insufficient documentation

## 2018-02-01 NOTE — Progress Notes (Signed)
Patient is out of her Symbicort inhaler because cannot afford it.

## 2018-02-01 NOTE — ED Triage Notes (Signed)
Pt c/o generalized malaise, congestion, cough, and headache since yesterday, pt has tried flonase, muccinex and ASA without relief

## 2018-02-02 MED ORDER — PREDNISONE 50 MG PO TABS
60.0000 mg | ORAL_TABLET | Freq: Once | ORAL | Status: AC
  Administered 2018-02-02: 60 mg via ORAL

## 2018-02-02 MED ORDER — PREDNISONE 50 MG PO TABS
ORAL_TABLET | ORAL | Status: AC
  Filled 2018-02-02: qty 1

## 2018-02-02 MED ORDER — PREDNISONE 20 MG PO TABS: 60.0000 mg | ORAL_TABLET | Freq: Every day | ORAL | 0 refills | Status: DC

## 2018-02-02 MED ORDER — ALBUTEROL SULFATE HFA 108 (90 BASE) MCG/ACT IN AERS
2.0000 | INHALATION_SPRAY | RESPIRATORY_TRACT | Status: DC | PRN
  Administered 2018-02-02: 2 via RESPIRATORY_TRACT
  Filled 2018-02-02: qty 6.7

## 2018-02-02 MED ORDER — IPRATROPIUM-ALBUTEROL 0.5-2.5 (3) MG/3ML IN SOLN
3.0000 mL | Freq: Once | RESPIRATORY_TRACT | Status: AC
  Administered 2018-02-02: 3 mL via RESPIRATORY_TRACT
  Filled 2018-02-02: qty 3

## 2018-02-02 MED ORDER — PREDNISONE 10 MG PO TABS
ORAL_TABLET | ORAL | Status: AC
  Filled 2018-02-02: qty 1

## 2018-02-02 NOTE — Progress Notes (Signed)
Patient states that she gets extremely jittery when she does albuterol.   Patient states that this last an hour to an hour and a half.

## 2018-02-02 NOTE — Progress Notes (Signed)
Patient states that she cannot afford her albuterol inhaler.

## 2018-02-02 NOTE — ED Provider Notes (Signed)
[facility] EMERGENCY DEPARTMENT Provider Note   CSN: [id number] Arrival date & time: 02/01/18  2152     History   Chief Complaint Chief Complaint  Patient presents with  . Cough    HPI Holly Cortez is a 57 y.o. female.   The history is provided by the patient.  She has a history of COPD and comes in with 3-day history of nonproductive cough and dyspnea.  She is complaining of headache and body aches.  She has right-sided sciatica chronically, and this has gotten worse.  She denies fever, chills, sweats.  She denies nausea or vomiting.  Nothing makes her symptoms better, nothing makes them worse.  She has been taking Flonase for her headache and aspirin for body aches.  She is a cigarette smoker.  She denies any sick contacts.  Past Medical History:  Diagnosis Date  . Bipolar disorder (HCC)   . Emphysema lung (HCC)   . Emphysema of lung (HCC)   . Pneumonia     Patient Active Problem List   Diagnosis Date Noted  . Back muscle spasm 08/27/2016  . Primary osteoarthritis of right hip 08/27/2016  . Need for hepatitis C screening test 08/27/2016  . Right hip pain 06/01/2015  . Right-sided low back pain with right-sided sciatica 06/01/2015  . Tobacco dependence 06/01/2015  . Emphysema of lung (HCC) 04/27/2015  . 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 Acute respiratory failure with hypoxia (HCC) 04/25/2015  . CAP (community acquired pneumonia) 04/25/2015  . Hypoxia   . 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 Right knee pain 12/20/2012  . Rash and nonspecific skin eruption 11/28/2012  . Arthritis 11/28/2012  . Leg edema, right 10/14/2012  . Manic depression (HCC) 10/14/2012    Past Surgical History:  Procedure Laterality Date  . APPENDECTOMY    . FINGER AMPUTATION Left      OB History   None      Home Medications    Prior to Admission medications   Medication Sig Start Date End Date Taking? Authorizing Provider  albuterol (PROVENTIL HFA;VENTOLIN HFA) 108 (90 BASE)  MCG/ACT inhaler Inhale 2 puffs into the lungs every 6 (six) hours as needed for wheezing or shortness of breath. 04/27/15   Leatha Gilding, MD  budesonide-formoterol (SYMBICORT) 80-4.5 MCG/ACT inhaler Inhale 2 puffs into the lungs 2 (two) times daily. 11/05/15   Alvira Monday, MD  buPROPion (WELLBUTRIN) 100 MG tablet Take 100 mg by mouth 2 (two) times daily.    [provider]  busPIRone (BUSPAR) 5 MG tablet Take 5 mg by mouth 3 (three) times daily.    [provider]  diazepam (VALIUM) 5 MG tablet Take 1 tablet (5 mg total) by mouth 2 (two) times daily. Patient not taking: Reported on 08/27/2016 03/09/16   Barrett Henle, PA-C  diclofenac (VOLTAREN) 75 MG EC tablet Take 1 tablet (75 mg total) by mouth 2 (two) times daily. 08/27/16   Massie Maroon, FNP  divalproex (DEPAKOTE) 250 MG DR tablet Take 750 mg by mouth at bedtime.    [provider]  FLUoxetine (PROZAC) 20 MG capsule Take 20 mg by mouth daily.    [provider]  gabapentin (NEURONTIN) 400 MG capsule Take 1 capsule (400 mg total) by mouth 3 (three) times daily. 11/30/16   Massie Maroon, FNP  Melatonin 3 MG TABS Take 2 tablets by mouth at bedtime.    [provider]  methocarbamol (ROBAXIN) 500 MG tablet Take 1 tablet (500 mg total)  by mouth 2 (two) times daily. Patient not taking: Reported on 11/26/2016 08/27/16   Massie Maroon, FNP  zolpidem (AMBIEN) 10 MG tablet Take 10 mg by mouth at bedtime as needed for sleep.    [provider]    Family History Family History  Problem Relation Age of Onset  . Heart disease Mother   . Hyperlipidemia Father     Social History Social History   Tobacco Use  . Smoking status: Current Every Day Smoker    Packs/day: 0.25    Types: Cigarettes  . Smokeless tobacco: Never Used  Substance Use Topics  . Alcohol use: No  . Drug use: No     Allergies   Flexeril [cyclobenzaprine] and Benadryl [diphenhydramine]   Review of  Systems Review of Systems  All other systems reviewed and are negative.    Physical Exam Updated Vital Signs BP 113/85 (BP Location: Right Arm)   Pulse 97   Temp 98.3 F (36.8 C) (Oral)   Resp 18   Ht 5\' 3"  (1.6 m)   Wt 63.5 kg   LMP 05/27/2011   SpO2 95%   BMI 24.80 kg/m    Physical Exam  Nursing note and vitals reviewed.  57 year old female, resting comfortably and in no acute distress. Vital signs are normal. Oxygen saturation is 95%, which is normal. Head is normocephalic and atraumatic. PERRLA, EOMI. Oropharynx is clear. Neck is nontender and supple without adenopathy or JVD. Back is nontender and there is no CVA tenderness. Lungs have diffuse inspiratory and expiratory wheezes without rales or rhonchi. Chest is nontender. Heart has regular rate and rhythm without murmur. Abdomen is soft, flat, nontender without masses or hepatosplenomegaly and peristalsis is normoactive. Extremities have no cyanosis or edema, full range of motion is present. Skin is warm and dry without rash. Neurologic: Mental status is normal, cranial nerves are intact, there are no motor or sensory deficits.  ED Treatments / Results  Labs (all labs ordered are listed, but only abnormal results are displayed) Labs Reviewed - No data to display  EKG None  Radiology Dg Chest 2 View  Result Date: 02/01/2018 CLINICAL DATA:  Cough and congestion.  Chest pain for 2 weeks. EXAM: CHEST - 2 VIEW COMPARISON:  03/09/2016 FINDINGS: The cardiomediastinal contours are normal. Chronic bronchitic change. Pulmonary vasculature is normal. No consolidation, pleural effusion, or pneumothorax. No acute osseous abnormalities are seen. IMPRESSION: Chronic bronchial thickening may be bronchitis, asthma, or smoking related lung disease. No acute findings. Electronically Signed   By: Narda Rutherford M.D.   On: 02/01/2018 22:53    Procedures Procedures (including critical care time)  Medications Ordered in  ED Medications  ipratropium-albuterol (DUONEB) 0.5-2.5 (3) MG/3ML nebulizer solution 3 mL (has no administration in time range)  predniSONE (DELTASONE) tablet 60 mg (has no administration in time range)     Initial Impression / Assessment and Plan / ED Course  I have reviewed the triage vital signs and the nursing notes.  Pertinent labs & imaging results that were available during my care of the patient were reviewed by me and considered in my medical decision making (see chart for details).  Exacerbation of COPD secondary to respiratory tract infection which appears to be viral.  Chest x-ray shows no evidence of pneumonia.  Will give a dose of prednisone and give nebulizer treatment with albuterol and ipratropium.  Old records are reviewed, and she does have a prior admission for pneumonia, for several ED visits for COPD  exacerbation.  1:54 AM She has noted modest improvement with nebulizer treatment.  On reexam, there is improved airflow but still considerable wheezing.  Second nebulizer treatment is ordered.  2:41 AM She feels much better after second nebulizer treatment.  On reexam, lungs are almost clear and there is good airflow.  She is discharged with an albuterol inhaler and given prescription for 5-day course of prednisone.  Advised to stop smoking.  Return precautions discussed.  Final Clinical Impressions(s) / ED Diagnoses   Final diagnoses:  Bronchitis    ED Discharge Orders         Ordered    predniSONE (DELTASONE) 20 MG tablet  Daily     02/02/18 0239          Dione Booze, MD 02/02/18 [phone]

## 2018-02-03 MED FILL — predniSONE 20 MG TABS: 20 | 5 days supply | Qty: 15 | Fill #0

## 2018-02-05 ENCOUNTER — Other Ambulatory Visit: Payer: Self-pay

## 2018-02-05 ENCOUNTER — Emergency Department (HOSPITAL_COMMUNITY): Payer: Self-pay

## 2018-02-05 ENCOUNTER — Inpatient Hospital Stay (HOSPITAL_COMMUNITY)
Admission: EM | Admit: 2018-02-05 | Discharge: 2018-02-09 | DRG: 190 | Disposition: A | Discharge status: Home / Self Care | Payer: Self-pay | Attending: Internal Medicine | Admitting: Internal Medicine

## 2018-02-05 ENCOUNTER — Encounter (HOSPITAL_COMMUNITY): Payer: Self-pay | Admitting: Emergency Medicine

## 2018-02-05 DIAGNOSIS — J181 Lobar pneumonia, unspecified organism: Secondary | ICD-10-CM | POA: Diagnosis present

## 2018-02-05 DIAGNOSIS — J441 Chronic obstructive pulmonary disease with (acute) exacerbation: Principal | ICD-10-CM | POA: Diagnosis present

## 2018-02-05 DIAGNOSIS — Z599 Problem related to housing and economic circumstances, unspecified: Secondary | ICD-10-CM

## 2018-02-05 DIAGNOSIS — F17200 Nicotine dependence, unspecified, uncomplicated: Secondary | ICD-10-CM

## 2018-02-05 DIAGNOSIS — J189 Pneumonia, unspecified organism: Secondary | ICD-10-CM

## 2018-02-05 DIAGNOSIS — E875 Hyperkalemia: Secondary | ICD-10-CM | POA: Diagnosis not present

## 2018-02-05 DIAGNOSIS — J439 Emphysema, unspecified: Secondary | ICD-10-CM | POA: Diagnosis present

## 2018-02-05 DIAGNOSIS — Z7982 Long term (current) use of aspirin: Secondary | ICD-10-CM

## 2018-02-05 DIAGNOSIS — F319 Bipolar disorder, unspecified: Secondary | ICD-10-CM | POA: Diagnosis present

## 2018-02-05 DIAGNOSIS — Z89022 Acquired absence of left finger(s): Secondary | ICD-10-CM

## 2018-02-05 DIAGNOSIS — Z720 Tobacco use: Secondary | ICD-10-CM

## 2018-02-05 DIAGNOSIS — E876 Hypokalemia: Secondary | ICD-10-CM | POA: Diagnosis present

## 2018-02-05 DIAGNOSIS — F17210 Nicotine dependence, cigarettes, uncomplicated: Secondary | ICD-10-CM | POA: Diagnosis present

## 2018-02-05 DIAGNOSIS — J440 Chronic obstructive pulmonary disease with acute lower respiratory infection: Secondary | ICD-10-CM | POA: Diagnosis present

## 2018-02-05 DIAGNOSIS — T380X5A Adverse effect of glucocorticoids and synthetic analogues, initial encounter: Secondary | ICD-10-CM | POA: Diagnosis not present

## 2018-02-05 DIAGNOSIS — R739 Hyperglycemia, unspecified: Secondary | ICD-10-CM | POA: Diagnosis not present

## 2018-02-05 DIAGNOSIS — Z7952 Long term (current) use of systemic steroids: Secondary | ICD-10-CM

## 2018-02-05 DIAGNOSIS — Z8659 Personal history of other mental and behavioral disorders: Secondary | ICD-10-CM

## 2018-02-05 DIAGNOSIS — M5441 Lumbago with sciatica, right side: Secondary | ICD-10-CM | POA: Diagnosis present

## 2018-02-05 DIAGNOSIS — J9621 Acute and chronic respiratory failure with hypoxia: Secondary | ICD-10-CM | POA: Diagnosis present

## 2018-02-05 DIAGNOSIS — Z79899 Other long term (current) drug therapy: Secondary | ICD-10-CM

## 2018-02-05 DIAGNOSIS — Z23 Encounter for immunization: Secondary | ICD-10-CM

## 2018-02-05 DIAGNOSIS — G8929 Other chronic pain: Secondary | ICD-10-CM | POA: Diagnosis present

## 2018-02-05 LAB — COMPREHENSIVE METABOLIC PANEL
ALBUMIN: 2.8 g/dL — AB (ref 3.5–5.0)
ALK PHOS: 80 U/L (ref 38–126)
ALT: 11 U/L (ref 0–44)
AST: 13 U/L — AB (ref 15–41)
Anion gap: 10 (ref 5–15)
BUN: 32 mg/dL — AB (ref 6–20)
CO2: 24 mmol/L (ref 22–32)
Calcium: 8.6 mg/dL — ABNORMAL LOW (ref 8.9–10.3)
Chloride: 109 mmol/L (ref 98–111)
Creatinine, Ser: 1.03 mg/dL — ABNORMAL HIGH (ref 0.44–1.00)
GFR calc Af Amer: >60 mL/min (ref >60)
GLUCOSE: 110 mg/dL — AB (ref 70–99)
POTASSIUM: 3.4 mmol/L — AB (ref 3.5–5.1)
Sodium: 143 mmol/L (ref 135–145)
TOTAL PROTEIN: 7.1 g/dL (ref 6.5–8.1)
Total Bilirubin: 0.3 mg/dL (ref 0.3–1.2)

## 2018-02-05 LAB — CBC WITH DIFFERENTIAL/PLATELET
BASOS ABS: 0.1 10*3/uL (ref 0.0–0.1)
Basophils Relative: 0 %
Eosinophils Absolute: 0 10*3/uL (ref 0.0–0.7)
Eosinophils Relative: 0 %
HCT: 39.1 % (ref 36.0–46.0)
Hemoglobin: 13 g/dL (ref 12.0–15.0)
Lymphocytes Relative: 7 %
Lymphs Abs: 1.3 10*3/uL (ref 0.7–4.0)
MCH: 32 pg (ref 26.0–34.0)
MCHC: 33.2 g/dL (ref 30.0–36.0)
MCV: 96.3 fL (ref 78.0–100.0)
MONO ABS: 1.8 10*3/uL — AB (ref 0.1–1.0)
Monocytes Relative: 10 %
NEUTROS ABS: 15.1 10*3/uL — AB (ref 1.7–7.7)
Neutrophils Relative %: 83 %
Platelets: 268 10*3/uL (ref 150–400)
RBC: 4.06 MIL/uL (ref 3.87–5.11)
RDW: 14 % (ref 11.5–15.5)
WBC: 18.3 10*3/uL — ABNORMAL HIGH (ref 4.0–10.5)

## 2018-02-05 MED ORDER — AZITHROMYCIN 250 MG PO TABS
500.0000 mg | ORAL_TABLET | Freq: Once | ORAL | Status: AC
  Administered 2018-02-05: 500 mg via ORAL
  Filled 2018-02-05: qty 2

## 2018-02-05 MED ORDER — ALBUTEROL (5 MG/ML) CONTINUOUS INHALATION SOLN
10.0000 mg/h | INHALATION_SOLUTION | Freq: Once | RESPIRATORY_TRACT | Status: AC
  Administered 2018-02-05: 10 mg/h via RESPIRATORY_TRACT
  Filled 2018-02-05: qty 20

## 2018-02-05 MED ORDER — HYDROCODONE-ACETAMINOPHEN 5-325 MG PO TABS
1.0000 | ORAL_TABLET | Freq: Once | ORAL | Status: AC
  Administered 2018-02-05: 1 via ORAL
  Filled 2018-02-05 (×2): qty 1

## 2018-02-05 MED ORDER — SODIUM CHLORIDE 0.9 % IV SOLN
1.0000 g | Freq: Once | INTRAVENOUS | Status: AC
  Administered 2018-02-05: 1 g via INTRAVENOUS
  Filled 2018-02-05: qty 10

## 2018-02-05 NOTE — H&P (Signed)
History and Physical    Holly Cortez ZOX:[id number] DOB: 02/22/1961 DOA: 02/05/2018  PCP: Massie MaroonHollis, Lachina M, FNP   Patient coming from: Home  Chief Complaint: Worsening shortness of breath and cough  HPI: Holly Shamesammie Liss is a 57 y.o. female with medical history significant for COPD/emphysema who wears 4 L nasal cannula at night, bipolar disorder, sciatica, and ongoing tobacco abuse who presents to the emergency department after [facility] for symptoms of shortness of breath and cough.  At that time, she was started on prednisone and continue to use her home albuterol as she was thought to have a COPD exacerbation.  Chest x-ray at that time did not demonstrate any acute findings.  She continues to have worsening cough and shortness of breath.  She denies any fever, chills, hemoptysis, and has had minimal sputum production.  She denies any chest pain or worsening edema.   ED Course: Vital signs are stable and she is afebrile.  Heart rate is somewhat elevated at 105 bpm.  She is currently in no significant respiratory distress and is saturating at approximately the 90th percentile on room air.  Laboratory data reveals leukocytosis of 18,300 and potassium 3.4.  BUN is 32 and creatinine is 1.03 which is near her usual baseline. CXR demonstrates bilateral findings of bronchopneumonia with findings worse to left lower lobe.  She has been started on breathing treatments as well as azithromycin and Rocephin.  Review of Systems: All others reviewed and otherwise negative.  Past Medical History:  Diagnosis Date  . Bipolar disorder (HCC)   . Emphysema lung (HCC)   . Emphysema of lung (HCC)   . Pneumonia     Past Surgical History:  Procedure Laterality Date  . APPENDECTOMY    . FINGER AMPUTATION Left      reports that she has been smoking cigarettes. She has been smoking about 0.25 packs per day. She has never used smokeless tobacco. She reports that she does not drink  alcohol or use drugs.  No Known Allergies  Family History  Problem Relation Age of Onset  . Heart disease Mother   . Hyperlipidemia Father     Prior to Admission medications   Medication Sig Start Date End Date Taking? Authorizing Provider  albuterol (PROVENTIL HFA;VENTOLIN HFA) 108 (90 BASE) MCG/ACT inhaler Inhale 2 puffs into the lungs every 6 (six) hours as needed for wheezing or shortness of breath. 04/27/15  Yes Leatha GildingGherghe, Costin M, MD  aspirin EC 325 MG tablet Take 975 mg by mouth 3 (three) times daily as needed for mild pain or moderate pain.   Yes [provider]  buPROPion (WELLBUTRIN) 100 MG tablet Take 100 mg by mouth 2 (two) times daily.   Yes [provider]  busPIRone (BUSPAR) 5 MG tablet Take 5 mg by mouth 3 (three) times daily.   Yes [provider]  dextromethorphan (DELSYM) 30 MG/5ML liquid Take 60 mg by mouth daily as needed for cough.   Yes [provider]  Divalproex Sodium (DEPAKOTE PO) Take 250 mg by mouth at bedtime as needed (for sleep).    Yes [provider]  gabapentin (NEURONTIN) 300 MG capsule Take 300 mg by mouth 3 (three) times daily.   Yes [provider]  predniSONE (DELTASONE) 20 MG tablet Take 3 tablets (60 mg total) by mouth daily. 02/02/18  Yes Dione BoozeGlick, David, MD  zolpidem (AMBIEN) 10 MG tablet Take 10 mg by mouth at bedtime as needed for sleep.  Yes [provider]  budesonide-formoterol (SYMBICORT) 80-4.5 MCG/ACT inhaler Inhale 2 puffs into the lungs 2 (two) times daily. Patient not taking: Reported on 02/05/2018 11/05/15   Alvira Monday, MD    Physical Exam: Vitals:   02/05/18 2115 02/05/18 2130 02/05/18 2145 02/05/18 2200  BP:  100/76  114/79  Pulse: (!) 107  (!) 105 (!) 105  Resp: 16 18 16 17   Temp:      TempSrc:      SpO2: (!) 87%  95% 93%  Weight:      Height:        Constitutional: NAD, calm, comfortable Vitals:   02/05/18 2115 02/05/18 2130 02/05/18 2145 02/05/18 2200    BP:  100/76  114/79  Pulse: (!) 107  (!) 105 (!) 105  Resp: 16 18 16 17   Temp:      TempSrc:      SpO2: (!) 87%  95% 93%  Weight:      Height:       Eyes: lids and conjunctivae normal ENMT: Mucous membranes are moist.  Neck: normal, supple Respiratory: clear to auscultation bilaterally. Normal respiratory effort. No accessory muscle use.  Cardiovascular: Regular rate and rhythm, no murmurs. No extremity edema. Abdomen: no tenderness, no distention. Bowel sounds positive.  Musculoskeletal:  No joint deformity upper and lower extremities.   Skin: no rashes, lesions, ulcers.  Psychiatric: Normal judgment and insight. Alert and oriented x 3. Normal mood.   Labs on Admission: I have personally reviewed following labs and imaging studies  CBC: Recent Labs  Lab 02/05/18 1947  WBC 18.3*  NEUTROABS 15.1*  HGB 13.0  HCT 39.1  MCV 96.3  PLT 268   Basic Metabolic Panel: Recent Labs  Lab 02/05/18 1947  NA 143  K 3.4*  CL 109  CO2 24  GLUCOSE 110*  BUN 32*  CREATININE 1.03*  CALCIUM 8.6*   GFR: Estimated Creatinine Clearance: 53.3 mL/min (A) (by C-G formula based on SCr of 1.03 mg/dL (H)). Liver Function Tests: Recent Labs  Lab 02/05/18 1947  AST 13*  ALT 11  ALKPHOS 80  BILITOT 0.3  PROT 7.1  ALBUMIN 2.8*   No results for input(s): LIPASE, AMYLASE in the last 168 hours. No results for input(s): AMMONIA in the last 168 hours. Coagulation Profile: No results for input(s): INR, PROTIME in the last 168 hours. Cardiac Enzymes: No results for input(s): CKTOTAL, CKMB, CKMBINDEX, TROPONINI in the last 168 hours. BNP (last 3 results) No results for input(s): PROBNP in the last 8760 hours. HbA1C: No results for input(s): HGBA1C in the last 72 hours. CBG: No results for input(s): GLUCAP in the last 168 hours. Lipid Profile: No results for input(s): CHOL, HDL, LDLCALC, TRIG, CHOLHDL, LDLDIRECT in the last 72 hours. Thyroid Function Tests: No results for input(s):  TSH, T4TOTAL, FREET4, T3FREE, THYROIDAB in the last 72 hours. Anemia Panel: No results for input(s): VITAMINB12, FOLATE, FERRITIN, TIBC, IRON, RETICCTPCT in the last 72 hours. Urine analysis:    Component Value Date/Time   COLORURINE AMBER (A) 07/08/2011 2142   APPEARANCEUR CLEAR 07/08/2011 2142   LABSPEC >=1.030 11/26/2016 1422   PHURINE 5.5 11/26/2016 1422   GLUCOSEU NEGATIVE 11/26/2016 1422   HGBUR NEGATIVE 11/26/2016 1422   BILIRUBINUR NEGATIVE 11/26/2016 1422   KETONESUR TRACE (A) 11/26/2016 1422   PROTEINUR NEGATIVE 11/26/2016 1422   UROBILINOGEN 0.2 11/26/2016 1422   NITRITE NEGATIVE 11/26/2016 1422   LEUKOCYTESUR TRACE (A) 11/26/2016 1422    Radiological Exams on Admission: Dg  Chest 2 View  Result Date: 02/05/2018 CLINICAL DATA:  Shortness of breath over the last 4 days. History of pneumonia. EXAM: CHEST - 2 VIEW COMPARISON:  02/01/2018 FINDINGS: Heart size remains normal. Some aortic atherosclerosis. Worsening of bilateral pulmonary infiltrates, most pronounced in the left lower lobe. No dense consolidation or lobar collapse however. No effusions. IMPRESSION: Worsening of bilateral bronchopneumonia, most pronounced in the left lower lobe. Electronically Signed   By: Paulina Fusi M.D.   On: 02/05/2018 21:39    EKG: Independently reviewed.  Normal sinus rhythm at 98 bpm.  Assessment/Plan Principal Problem:   COPD with acute exacerbation (HCC) Active Problems:   Pneumonia   Emphysema of lung (HCC)   Right-sided low back pain with right-sided sciatica   Bipolar disorder (HCC)   Tobacco abuse    1. Acute COPD exacerbation with bilateral bronchopneumonia.  Maintain on azithromycin and Rocephin as well as IV methylprednisolone and breathing treatments.  Patient does wear 4 L nasal cannula at home and does not appear to have any increased oxygen requirements currently.  May likely require observation for 24 to 48 hours and potential discharge afterwards on oral antibiotics.   Symbicort to [facility]. 2. Mild hypokalemia.  Replete with oral potassium and recheck in a.m. along with magnesium. 3. Right-sided sciatica.  Continue gabapentin as well as Norco for breakthrough pain. 4. Bipolar disorder.  Continue Depakote, bupropion, and BuSpar. 5. Tobacco abuse.  Counseled on cessation.  Will order nicotine patch.   DVT prophylaxis: Lovenox Code Status: Full Family Communication: Sister at bedside Disposition Plan: Treatment of COPD exacerbation as well as pneumonia; observation with likely discharge in 24 hours Consults called: None Admission status: Observation, MedSurg   Aleeya Veitch Hoover Brunette DO [facility] Pager [phone]  If 7PM-7AM, please contact night-coverage [email or web address] Password [email or web address]  02/06/2018, 12:02 AM

## 2018-02-05 NOTE — ED Notes (Signed)
Patient ambulation around the the nurse station patient oxygen was 89/90 heart rate was114

## 2018-02-05 NOTE — ED Triage Notes (Signed)
PT reports SOB x 4 days.  Called ems today for respiratory distress.  EMS gave albuterol and atrovent neb and 125mg  solumedrol PTA.

## 2018-02-05 NOTE — ED Provider Notes (Signed)
[facility] EMERGENCY DEPARTMENT Provider Note   CSN: [id number] Arrival date & time: 02/05/18  1839     History   Chief Complaint Chief Complaint  Patient presents with  . Shortness of Breath    HPI Holly Cortez is a 57 y.o. female.  HPI  Holly Cortez is a 57 y.o. female with hx of COPD who presents to the Emergency Department complaining of persistent shortness of breath and cough for over one week.  [facility]  for same and started on prednisone and she continues to use her albuterol   MDI at home, both of which she states are not helping.  Symptoms are associated with cough that is mostly non-productive.  Received albuterol/atrovent neb by EMS with 125 mg solu medrol with some relief.  Denies fever, chills, hemoptysis, and chest pain.  No nausea or vomiting.  Continues to smoke.  Uses home O2 at 4L [place] at night.  Also complains of low back and right leg pain that she states is chronic.  Denies injury, swelling or numbness of the extremity.  No urine or bowel changes.     Past Medical History:  Diagnosis Date  . Bipolar disorder (HCC)   . Emphysema lung (HCC)   . Emphysema of lung (HCC)   . Pneumonia     Patient Active Problem List   Diagnosis Date Noted  . Back muscle spasm 08/27/2016  . Primary osteoarthritis of right hip 08/27/2016  . Need for hepatitis C screening test 08/27/2016  . Right hip pain 06/01/2015  . Right-sided low back pain with right-sided sciatica 06/01/2015  . Tobacco dependence 06/01/2015  . Emphysema of lung (HCC) 04/27/2015  . 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 Acute respiratory failure with hypoxia (HCC) 04/25/2015  . CAP (community acquired pneumonia) 04/25/2015  . Hypoxia   . 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 Right knee pain 12/20/2012  . Rash and nonspecific skin eruption 11/28/2012  . Arthritis 11/28/2012  . Leg edema, right 10/14/2012  . Manic depression (HCC) 10/14/2012    Past Surgical History:  Procedure  Laterality Date  . APPENDECTOMY    . FINGER AMPUTATION Left      OB History   None      Home Medications    Prior to Admission medications   Medication Sig Start Date End Date Taking? Authorizing Provider  albuterol (PROVENTIL HFA;VENTOLIN HFA) 108 (90 BASE) MCG/ACT inhaler Inhale 2 puffs into the lungs every 6 (six) hours as needed for wheezing or shortness of breath. 04/27/15   Leatha GildingGherghe, Costin M, MD  budesonide-formoterol (SYMBICORT) 80-4.5 MCG/ACT inhaler Inhale 2 puffs into the lungs 2 (two) times daily. 11/05/15   Alvira MondaySchlossman, Erin, MD  buPROPion (WELLBUTRIN) 100 MG tablet Take 100 mg by mouth 2 (two) times daily.    [provider]  busPIRone (BUSPAR) 5 MG tablet Take 5 mg by mouth 3 (three) times daily.    [provider]  diazepam (VALIUM) 5 MG tablet Take 1 tablet (5 mg total) by mouth 2 (two) times daily. Patient not taking: Reported on 08/27/2016 03/09/16   Barrett HenleNadeau, Nicole Elizabeth, PA-C  diclofenac (VOLTAREN) 75 MG EC tablet Take 1 tablet (75 mg total) by mouth 2 (two) times daily. 08/27/16   Massie MaroonHollis, Lachina M, FNP  divalproex (DEPAKOTE) 250 MG DR tablet Take 750 mg by mouth at bedtime.    [provider]  FLUoxetine (PROZAC) 20 MG capsule Take 20 mg by mouth daily.    [provider]  gabapentin (NEURONTIN) 400 MG capsule Take 1 capsule (400 mg total) by mouth 3 (three) times daily. 11/30/16   Massie Maroon, FNP  Melatonin 3 MG TABS Take 2 tablets by mouth at bedtime.    [provider]  methocarbamol (ROBAXIN) 500 MG tablet Take 1 tablet (500 mg total) by mouth 2 (two) times daily. Patient not taking: Reported on 11/26/2016 08/27/16   Massie Maroon, FNP  predniSONE (DELTASONE) 20 MG tablet Take 3 tablets (60 mg total) by mouth daily. 02/02/18   Dione Booze, MD  zolpidem (AMBIEN) 10 MG tablet Take 10 mg by mouth at bedtime as needed for sleep.    [provider]    Family History Family History  Problem Relation Age of  Onset  . Heart disease Mother   . Hyperlipidemia Father     Social History Social History   Tobacco Use  . Smoking status: Current Every Day Smoker    Packs/day: 0.25    Types: Cigarettes  . Smokeless tobacco: Never Used  Substance Use Topics  . Alcohol use: No  . Drug use: No     Allergies   Flexeril [cyclobenzaprine] and Benadryl [diphenhydramine]   Review of Systems Review of Systems  Constitutional: Negative for appetite change, chills and fever.  HENT: Negative for congestion, sore throat and trouble swallowing.   Respiratory: Positive for cough, chest tightness and shortness of breath. Negative for wheezing.   Cardiovascular: Negative for chest pain and leg swelling.  Gastrointestinal: Negative for abdominal pain, nausea and vomiting.  Genitourinary: Negative for difficulty urinating, dysuria and flank pain.  Musculoskeletal: Positive for back pain. Negative for arthralgias.  Skin: Negative for rash.  Neurological: Negative for dizziness, weakness and numbness.  Hematological: Negative for adenopathy.     Physical Exam Updated Vital Signs BP 110/70 (BP Location: Left Arm)   Pulse 95   Temp (!) 97.4 F (36.3 C) (Oral)   Resp 17   Ht 5\' 4"  (1.626 m)   Wt 64.9 kg   LMP 05/27/2011   SpO2 93%   BMI 24.55 kg/m   Physical Exam  Constitutional:  Frail appearing   HENT:  Head: Atraumatic.  Mouth/Throat: Mucous membranes are dry. No posterior oropharyngeal edema or posterior oropharyngeal erythema.  Neck: No JVD present.  Cardiovascular: Normal rate, regular rhythm and intact distal pulses.  Pulmonary/Chest: No respiratory distress. She has wheezes. She exhibits no tenderness.  Diminished lung sounds bilaterally with few scattered inspiratory wheezes.    Abdominal: Soft. She exhibits no distension. There is no tenderness.  Musculoskeletal: She exhibits no edema.  ttp of the right lumbar paraspinal muscles and SI joint space.  Hip flexors and extensors are  intact.    Neurological: She is alert. No sensory deficit.  Skin: Skin is warm. Capillary refill takes less than 2 seconds. No rash noted.  Psychiatric: She has a normal mood and affect.  Nursing note and vitals reviewed.    ED Treatments / Results  Labs (all labs ordered are listed, but only abnormal results are displayed) Labs Reviewed  CBC WITH DIFFERENTIAL/PLATELET - Abnormal; Notable for the following components:      Result Value   WBC 18.3 (*)    Neutro Abs 15.1 (*)    Monocytes Absolute 1.8 (*)    All other components within normal limits  COMPREHENSIVE METABOLIC PANEL - Abnormal; Notable for the following components:   Potassium 3.4 (*)    Glucose, Bld 110 (*)  BUN 32 (*)    Creatinine, Ser 1.03 (*)    Calcium 8.6 (*)    Albumin 2.8 (*)    AST 13 (*)    All other components within normal limits    EKG EKG Interpretation  Date/Time:  Saturday February 05 2018 18:45:43 EDT Ventricular Rate:  98 PR Interval:    QRS Duration: 102 QT Interval:  316 QTC Calculation: 404 R Axis:   47 Text Interpretation:  Sinus rhythm RSR' in V1 or V2, right VCD or RVH No STEMI.  Confirmed by Alona Bene [phone]) on 02/05/2018 7:34:06 PM   Radiology Dg Chest 2 View  Result Date: 02/05/2018 CLINICAL DATA:  Shortness of breath over the last 4 days. History of pneumonia. EXAM: CHEST - 2 VIEW COMPARISON:  02/01/2018 FINDINGS: Heart size remains normal. Some aortic atherosclerosis. Worsening of bilateral pulmonary infiltrates, most pronounced in the left lower lobe. No dense consolidation or lobar collapse however. No effusions. IMPRESSION: Worsening of bilateral bronchopneumonia, most pronounced in the left lower lobe. Electronically Signed   By: Paulina Fusi M.D.   On: 02/05/2018 21:39    Procedures Procedures (including critical care time)  Medications Ordered in ED Medications  cefTRIAXone (ROCEPHIN) 1 g in sodium chloride 0.9 % 100 mL IVPB (1 g Intravenous New Bag/Given  02/05/18 2244)  albuterol (PROVENTIL,VENTOLIN) solution continuous neb (10 mg/hr Nebulization Given 02/05/18 1952)  HYDROcodone-acetaminophen (NORCO/VICODIN) 5-325 MG per tablet 1 tablet (1 tablet Oral Given 02/05/18 2041)  azithromycin (ZITHROMAX) tablet 500 mg (500 mg Oral Given 02/05/18 2240)     Initial Impression / Assessment and Plan / ED Course  I have reviewed the triage vital signs and the nursing notes.  Pertinent labs & imaging results that were available during my care of the patient were reviewed by me and considered in my medical decision making (see chart for details).     Pt recently seen at [facility] for same. Previous CXR neg for PNA.   No reported improvement with prednisone and albuterol MDI.   Breathing slightly labored, and lung sounds diminished  sats in low 90's on 2L [place], will obtain labs, continuous albuterol neb and repeat CXR.    XR tonight shows PNA, and leukocytosis, but pt currently taking steroids.   On recheck, lung sounds slightly improved, but wheezing remains.  Will start abx. And consult hospitalist.   2300 consulted Dr. Sherryll Burger who will see pt in ED and admit  Pt ambulated in the dept with O2 sat 89-90% on RA.    Final Clinical Impressions(s) / ED Diagnoses   Final diagnoses:  Community acquired pneumonia, unspecified laterality    ED Discharge Orders    None       Pauline Aus, PA-C 02/06/18 0054    Maia Plan, MD 02/06/18 [phone]

## 2018-02-06 ENCOUNTER — Other Ambulatory Visit: Payer: Self-pay

## 2018-02-06 ENCOUNTER — Encounter (HOSPITAL_COMMUNITY): Payer: Self-pay

## 2018-02-06 DIAGNOSIS — J9621 Acute and chronic respiratory failure with hypoxia: Secondary | ICD-10-CM

## 2018-02-06 DIAGNOSIS — J181 Lobar pneumonia, unspecified organism: Secondary | ICD-10-CM

## 2018-02-06 DIAGNOSIS — Z720 Tobacco use: Secondary | ICD-10-CM

## 2018-02-06 DIAGNOSIS — F319 Bipolar disorder, unspecified: Secondary | ICD-10-CM

## 2018-02-06 DIAGNOSIS — J441 Chronic obstructive pulmonary disease with (acute) exacerbation: Secondary | ICD-10-CM

## 2018-02-06 LAB — BASIC METABOLIC PANEL
Anion gap: 7 (ref 5–15)
BUN: 28 mg/dL — AB (ref 6–20)
CHLORIDE: 108 mmol/L (ref 98–111)
CO2: 24 mmol/L (ref 22–32)
Calcium: 8.5 mg/dL — ABNORMAL LOW (ref 8.9–10.3)
Creatinine, Ser: 0.94 mg/dL (ref 0.44–1.00)
GFR calc Af Amer: >60 mL/min (ref >60)
GLUCOSE: 167 mg/dL — AB (ref 70–99)
Potassium: 4 mmol/L (ref 3.5–5.1)
SODIUM: 139 mmol/L (ref 135–145)

## 2018-02-06 LAB — IRON AND TIBC
Iron: 21 ug/dL — ABNORMAL LOW (ref 28–170)
Saturation Ratios: 9 % — ABNORMAL LOW (ref 10.4–31.8)
TIBC: 237 ug/dL — ABNORMAL LOW (ref 250–450)
UIBC: 216 ug/dL

## 2018-02-06 LAB — CBC
HCT: 36.5 % (ref 36.0–46.0)
Hemoglobin: 11.8 g/dL — ABNORMAL LOW (ref 12.0–15.0)
MCH: 31.5 pg (ref 26.0–34.0)
MCHC: 32.3 g/dL (ref 30.0–36.0)
MCV: 97.3 fL (ref 78.0–100.0)
Platelets: 285 10*3/uL (ref 150–400)
RBC: 3.75 MIL/uL — ABNORMAL LOW (ref 3.87–5.11)
RDW: 14.1 % (ref 11.5–15.5)
WBC: 18.4 10*3/uL — AB (ref 4.0–10.5)

## 2018-02-06 LAB — VITAMIN B12: VITAMIN B 12: 658 pg/mL (ref 180–914)

## 2018-02-06 LAB — FERRITIN: Ferritin: 92 ng/mL (ref 11–307)

## 2018-02-06 LAB — GLUCOSE, CAPILLARY
GLUCOSE-CAPILLARY: 105 mg/dL — AB (ref 70–99)
Glucose-Capillary: 174 mg/dL — ABNORMAL HIGH (ref 70–99)
Glucose-Capillary: 138 mg/dL — ABNORMAL HIGH (ref 70–99)

## 2018-02-06 LAB — MAGNESIUM: MAGNESIUM: 2.3 mg/dL (ref 1.7–2.4)

## 2018-02-06 MED ORDER — INSULIN ASPART 100 UNIT/ML ~~LOC~~ SOLN
0.0000 [IU] | Freq: Three times a day (TID) | SUBCUTANEOUS | Status: DC
  Administered 2018-02-06: 2 [IU] via SUBCUTANEOUS
  Administered 2018-02-06 – 2018-02-08 (×4): 1 [IU] via SUBCUTANEOUS

## 2018-02-06 MED ORDER — ZOLPIDEM TARTRATE 5 MG PO TABS
5.0000 mg | ORAL_TABLET | Freq: Every evening | ORAL | Status: DC | PRN
  Administered 2018-02-06 – 2018-02-08 (×4): 5 mg via ORAL
  Filled 2018-02-06 (×4): qty 1

## 2018-02-06 MED ORDER — SODIUM CHLORIDE 0.9 % IV SOLN
510.0000 mg | Freq: Once | INTRAVENOUS | Status: AC
  Administered 2018-02-06: 510 mg via INTRAVENOUS
  Filled 2018-02-06: qty 17

## 2018-02-06 MED ORDER — SODIUM CHLORIDE 0.9 % IV SOLN
1.0000 g | INTRAVENOUS | Status: DC
  Administered 2018-02-07 – 2018-02-08 (×2): 1 g via INTRAVENOUS
  Filled 2018-02-06: qty 10
  Filled 2018-02-06: qty 1
  Filled 2018-02-06 (×2): qty 10

## 2018-02-06 MED ORDER — SODIUM CHLORIDE 0.9 % IV SOLN
125.0000 mg | Freq: Once | INTRAVENOUS | Status: DC
  Filled 2018-02-06: qty 10

## 2018-02-06 MED ORDER — IPRATROPIUM-ALBUTEROL 0.5-2.5 (3) MG/3ML IN SOLN
3.0000 mL | Freq: Four times a day (QID) | RESPIRATORY_TRACT | Status: DC
  Administered 2018-02-06 (×2): 3 mL via RESPIRATORY_TRACT
  Filled 2018-02-06 (×2): qty 3

## 2018-02-06 MED ORDER — ENOXAPARIN SODIUM 40 MG/0.4ML ~~LOC~~ SOLN
40.0000 mg | SUBCUTANEOUS | Status: DC
  Administered 2018-02-06 – 2018-02-08 (×4): 40 mg via SUBCUTANEOUS
  Filled 2018-02-06 (×4): qty 0.4

## 2018-02-06 MED ORDER — SODIUM CHLORIDE 0.9 % IV SOLN
500.0000 mg | INTRAVENOUS | Status: DC
  Filled 2018-02-06: qty 500

## 2018-02-06 MED ORDER — ONDANSETRON HCL 4 MG PO TABS: 4.0000 mg | ORAL_TABLET | Freq: Four times a day (QID) | ORAL | Status: DC | PRN

## 2018-02-06 MED ORDER — DEXTROMETHORPHAN POLISTIREX ER 30 MG/5ML PO SUER
60.0000 mg | Freq: Every day | ORAL | Status: DC | PRN
  Administered 2018-02-06: 60 mg via ORAL
  Filled 2018-02-06 (×2): qty 10

## 2018-02-06 MED ORDER — DIVALPROEX SODIUM 250 MG PO DR TAB: 250.0000 mg | DELAYED_RELEASE_TABLET | Freq: Every evening | ORAL | Status: DC | PRN

## 2018-02-06 MED ORDER — GABAPENTIN 300 MG PO CAPS
300.0000 mg | ORAL_CAPSULE | Freq: Three times a day (TID) | ORAL | Status: DC
  Administered 2018-02-06 – 2018-02-09 (×10): 300 mg via ORAL
  Filled 2018-02-06 (×10): qty 1

## 2018-02-06 MED ORDER — INFLUENZA VAC SPLIT QUAD 0.5 ML IM SUSY
0.5000 mL | PREFILLED_SYRINGE | INTRAMUSCULAR | Status: AC
  Administered 2018-02-07: 0.5 mL via INTRAMUSCULAR
  Filled 2018-02-06: qty 0.5

## 2018-02-06 MED ORDER — BUSPIRONE HCL 5 MG PO TABS
5.0000 mg | ORAL_TABLET | Freq: Three times a day (TID) | ORAL | Status: DC
  Administered 2018-02-06 – 2018-02-09 (×10): 5 mg via ORAL
  Filled 2018-02-06 (×10): qty 1

## 2018-02-06 MED ORDER — BUDESONIDE 0.5 MG/2ML IN SUSP
0.5000 mg | Freq: Two times a day (BID) | RESPIRATORY_TRACT | Status: DC
  Administered 2018-02-06 – 2018-02-09 (×6): 0.5 mg via RESPIRATORY_TRACT
  Filled 2018-02-06 (×6): qty 2

## 2018-02-06 MED ORDER — NICOTINE 7 MG/24HR TD PT24
7.0000 mg | MEDICATED_PATCH | Freq: Every day | TRANSDERMAL | Status: DC
  Administered 2018-02-06 – 2018-02-09 (×4): 7 mg via TRANSDERMAL
  Filled 2018-02-06 (×7): qty 1

## 2018-02-06 MED ORDER — IPRATROPIUM-ALBUTEROL 0.5-2.5 (3) MG/3ML IN SOLN
3.0000 mL | Freq: Four times a day (QID) | RESPIRATORY_TRACT | Status: DC
  Administered 2018-02-06 – 2018-02-09 (×11): 3 mL via RESPIRATORY_TRACT
  Filled 2018-02-06 (×11): qty 3

## 2018-02-06 MED ORDER — MOMETASONE FURO-FORMOTEROL FUM 100-5 MCG/ACT IN AERO
2.0000 | INHALATION_SPRAY | Freq: Two times a day (BID) | RESPIRATORY_TRACT | Status: DC
  Filled 2018-02-06: qty 8.8

## 2018-02-06 MED ORDER — BUPROPION HCL 100 MG PO TABS
100.0000 mg | ORAL_TABLET | Freq: Two times a day (BID) | ORAL | Status: DC
  Administered 2018-02-06 – 2018-02-09 (×8): 100 mg via ORAL
  Filled 2018-02-06 (×15): qty 1

## 2018-02-06 MED ORDER — BUDESONIDE 0.5 MG/2ML IN SUSP
RESPIRATORY_TRACT | Status: AC
  Administered 2018-02-06: 11:00:00
  Filled 2018-02-06: qty 2

## 2018-02-06 MED ORDER — ACETAMINOPHEN 325 MG PO TABS
650.0000 mg | ORAL_TABLET | Freq: Four times a day (QID) | ORAL | Status: DC | PRN
  Administered 2018-02-06 – 2018-02-07 (×2): 650 mg via ORAL
  Filled 2018-02-06 (×2): qty 2

## 2018-02-06 MED ORDER — ONDANSETRON HCL 4 MG/2ML IJ SOLN: 4.0000 mg | Freq: Four times a day (QID) | INTRAMUSCULAR | Status: DC | PRN

## 2018-02-06 MED ORDER — HYDROCODONE-ACETAMINOPHEN 5-325 MG PO TABS
1.0000 | ORAL_TABLET | ORAL | Status: DC | PRN
  Administered 2018-02-06 – 2018-02-08 (×9): 2 via ORAL
  Administered 2018-02-08: 1 via ORAL
  Administered 2018-02-08 – 2018-02-09 (×5): 2 via ORAL
  Filled 2018-02-06 (×15): qty 2

## 2018-02-06 MED ORDER — POTASSIUM CHLORIDE CRYS ER 20 MEQ PO TBCR
40.0000 meq | EXTENDED_RELEASE_TABLET | Freq: Once | ORAL | Status: AC
  Administered 2018-02-06: 40 meq via ORAL
  Filled 2018-02-06: qty 2

## 2018-02-06 MED ORDER — LACTATED RINGERS IV SOLN
INTRAVENOUS | Status: AC
  Administered 2018-02-06: 01:00:00 via INTRAVENOUS

## 2018-02-06 MED ORDER — ORAL CARE MOUTH RINSE
15.0000 mL | Freq: Two times a day (BID) | OROMUCOSAL | Status: DC
  Administered 2018-02-08 (×2): 15 mL via OROMUCOSAL

## 2018-02-06 MED ORDER — AZITHROMYCIN 250 MG PO TABS
500.0000 mg | ORAL_TABLET | ORAL | Status: DC
  Administered 2018-02-06 – 2018-02-08 (×3): 500 mg via ORAL
  Filled 2018-02-06 (×3): qty 2

## 2018-02-06 MED ORDER — FERUMOXYTOL INJECTION 510 MG/17 ML
INTRAVENOUS | Status: AC
  Filled 2018-02-06: qty 17

## 2018-02-06 MED ORDER — METHYLPREDNISOLONE SODIUM SUCC 40 MG IJ SOLR
40.0000 mg | Freq: Four times a day (QID) | INTRAMUSCULAR | Status: DC
  Administered 2018-02-06 – 2018-02-08 (×12): 40 mg via INTRAVENOUS
  Filled 2018-02-06 (×12): qty 1

## 2018-02-06 MED ORDER — ACETAMINOPHEN 650 MG RE SUPP: 650.0000 mg | Freq: Four times a day (QID) | RECTAL | Status: DC | PRN

## 2018-02-06 NOTE — Progress Notes (Signed)
PROGRESS NOTE  Holly Cortez ZOX:[id number] DOB: 1960/09/17 DOA: 02/05/2018 PCP: Massie Maroon, FNP  Brief History:  57 year old female with a history of COPD, bipolar disorder, tobacco abuse presenting with 3 to 4-day history of increasing shortness of breath and nonproductive cough.  The patient denies any fevers, chills, chest pain, nausea, vomiting, diarrhea.  She went to [facility] on 02/01/2018 where she was diagnosed with COPD exacerbation.  Patient received bronchodilators and was discharged from the emergency department with prednisone.  The patient stated that her shortness of breath and coughing did not improve.  As result, the patient presented for further evaluation on 02/05/2018.  The patient denies any recent travels, hemoptysis, headache, dysuria, hematuria.  She continues to smoke 1 to 2 cigarettes/day.  She has an approximately 15-pack-year history.  She denies any alcohol or illegal drug use.  Patient has not been able to afford her Symbicort.  Upon presentation, the patient was afebrile hemodynamically stable, but had hypoxia at 89% on room air.  The patient states that she has not seen a physician for nearly 3 years.  She has been getting all her home medications from [place].  Assessment/Plan: Acute on chronic respiratory failure with hypoxia -Patient is normally on 4 L nasal cannula at nighttime -Presently stable on 1-2 L -Secondary to pneumonia and COPD exacerbation -Wean oxygen back to baseline  Lobar pneumonia -Personally reviewed chest x-ray--bibasilar infiltrates -Continue ceftriaxone and azithromycin  COPD exacerbation -Start Pulmicort -Continue duo nebs -Continue IV Solu-Medrol  Tobacco abuse I have discussed tobacco cessation with the patient.  I have counseled the patient regarding the negative impacts of continued tobacco use including but not limited to lung cancer, COPD, and cardiovascular disease.  I have discussed alternatives to tobacco and  modalities that may help facilitate tobacco cessation including but not limited to biofeedback, hypnosis, and medications.  Total time spent with tobacco counseling was 4 minutes.  Chronic pain with sciatica -Continue gabapentin -Hydrocodone as needed breakthrough pain  Bipolar disorder -Continue bupropion, BuSpar  Hyperglycemia -check A1C -novolog sliding scale    Disposition Plan:   Home in 1-2 days  Family Communication:  No Family at bedside  Consultants:  none  Code Status:  FULL  DVT Prophylaxis:  [place] Lovenox   Procedures: As Listed in Progress Note Above  Antibiotics: Ceftriaxone 9/14>>> azithro 9/14>>>    Subjective:  Patient continues to complain of a nonproductive cough.  She states that her breathing is gradually improving on maximal therapy.  She denies any nausea, vomiting, diarrhea, abdominal pain, chest pain, headache, fevers, chills.  There is no medication or melena. Objective: Vitals:   02/06/18 0612 02/06/18 0638 02/06/18 0818 02/06/18 1035  BP: 114/68     Pulse: 63     Resp: 14     Temp: 98.2 F (36.8 C)     TempSrc: Oral     SpO2: (!) 89% 94% 95% 96%  Weight:      Height:        Intake/Output Summary (Last 24 hours) at 02/06/2018 1055 Last data filed at 02/06/2018 0653 Gross per 24 hour  Intake 745.39 ml  Output -  Net 745.39 ml   Weight change:  Exam:   General:  Pt is alert, follows commands appropriately, not in acute distress  HEENT: No icterus, No thrush, No neck mass, /AT  Cardiovascular: RRR, S1/S2, no rubs, no gallops  Respiratory: Bibasilar rales.  Bibasilar expiratory wheeze.  Good air  movement.  Abdomen: Soft/+BS, non tender, non distended, no guarding  Extremities: No edema, No lymphangitis, No petechiae, No rashes, no synovitis   Data Reviewed: I have personally reviewed following labs and imaging studies Basic Metabolic Panel: Recent Labs  Lab 02/05/18 1947 02/06/18 0523  NA 143 139  K 3.4* 4.0  CL  109 108  CO2 24 24  GLUCOSE 110* 167*  BUN 32* 28*  CREATININE 1.03* 0.94  CALCIUM 8.6* 8.5*  MG  --  2.3   Liver Function Tests: Recent Labs  Lab 02/05/18 1947  AST 13*  ALT 11  ALKPHOS 80  BILITOT 0.3  PROT 7.1  ALBUMIN 2.8*   No results for input(s): LIPASE, AMYLASE in the last 168 hours. No results for input(s): AMMONIA in the last 168 hours. Coagulation Profile: No results for input(s): INR, PROTIME in the last 168 hours. CBC: Recent Labs  Lab 02/05/18 1947 02/06/18 0523  WBC 18.3* 18.4*  NEUTROABS 15.1*  --   HGB 13.0 11.8*  HCT 39.1 36.5  MCV 96.3 97.3  PLT 268 285   Cardiac Enzymes: No results for input(s): CKTOTAL, CKMB, CKMBINDEX, TROPONINI in the last 168 hours. BNP: Invalid input(s): POCBNP CBG: No results for input(s): GLUCAP in the last 168 hours. HbA1C: No results for input(s): HGBA1C in the last 72 hours. Urine analysis:    Component Value Date/Time   COLORURINE AMBER (A) 07/08/2011 2142   APPEARANCEUR CLEAR 07/08/2011 2142   LABSPEC >=1.030 11/26/2016 1422   PHURINE 5.5 11/26/2016 1422   GLUCOSEU NEGATIVE 11/26/2016 1422   HGBUR NEGATIVE 11/26/2016 1422   BILIRUBINUR NEGATIVE 11/26/2016 1422   KETONESUR TRACE (A) 11/26/2016 1422   PROTEINUR NEGATIVE 11/26/2016 1422   UROBILINOGEN 0.2 11/26/2016 1422   NITRITE NEGATIVE 11/26/2016 1422   LEUKOCYTESUR TRACE (A) 11/26/2016 1422   Sepsis Labs: @LABRCNTIP (procalcitonin:4,lacticidven:4) )No results found for this or any previous visit (from the past 240 hour(s)).   Scheduled Meds: . budesonide (PULMICORT) nebulizer solution  0.5 mg Nebulization BID  . buPROPion  100 mg Oral BID  . busPIRone  5 mg Oral TID  . enoxaparin (LOVENOX) injection  40 mg Subcutaneous Q24H  . gabapentin  300 mg Oral TID  . [START ON 02/07/2018] Influenza vac split quadrivalent PF  0.5 mL Intramuscular Tomorrow-1000  . ipratropium-albuterol  3 mL Nebulization Q6H  . [START ON 02/07/2018] mouth rinse  15 mL Mouth  Rinse BID  . methylPREDNISolone (SOLU-MEDROL) injection  40 mg Intravenous Q6H  . nicotine  7 mg Transdermal Daily   Continuous Infusions: . azithromycin    . [START ON 02/07/2018] cefTRIAXone (ROCEPHIN)  IV    . lactated ringers 75 mL/hr at 02/06/18 [id number]    Procedures/Studies: Dg Chest 2 View  Result Date: 02/05/2018 CLINICAL DATA:  Shortness of breath over the last 4 days. History of pneumonia. EXAM: CHEST - 2 VIEW COMPARISON:  02/01/2018 FINDINGS: Heart size remains normal. Some aortic atherosclerosis. Worsening of bilateral pulmonary infiltrates, most pronounced in the left lower lobe. No dense consolidation or lobar collapse however. No effusions. IMPRESSION: Worsening of bilateral bronchopneumonia, most pronounced in the left lower lobe. Electronically Signed   By: Paulina FusiMark  Shogry M.D.   On: 02/05/2018 21:39   Dg Chest 2 View  Result Date: 02/01/2018 CLINICAL DATA:  Cough and congestion.  Chest pain for 2 weeks. EXAM: CHEST - 2 VIEW COMPARISON:  03/09/2016 FINDINGS: The cardiomediastinal contours are normal. Chronic bronchitic change. Pulmonary vasculature is normal. No consolidation, pleural effusion, or pneumothorax. No  acute osseous abnormalities are seen. IMPRESSION: Chronic bronchial thickening may be bronchitis, asthma, or smoking related lung disease. No acute findings. Electronically Signed   By: Narda Rutherford M.D.   On: 02/01/2018 22:53    Catarina Hartshorn, DO  [facility] Pager [phone]  If 7PM-7AM, please contact night-coverage [email or web address] Password [email or web address] 02/06/2018, 10:55 AM   LOS: 0 days

## 2018-02-07 LAB — BASIC METABOLIC PANEL
ANION GAP: 8 (ref 5–15)
BUN: 25 mg/dL — ABNORMAL HIGH (ref 6–20)
CHLORIDE: 107 mmol/L (ref 98–111)
CO2: 27 mmol/L (ref 22–32)
Calcium: 9.2 mg/dL (ref 8.9–10.3)
Creatinine, Ser: 0.85 mg/dL (ref 0.44–1.00)
GFR calc Af Amer: >60 mL/min (ref >60)
GFR calc non Af Amer: >60 mL/min (ref >60)
Glucose, Bld: 122 mg/dL — ABNORMAL HIGH (ref 70–99)
Potassium: 5.3 mmol/L — ABNORMAL HIGH (ref 3.5–5.1)
SODIUM: 142 mmol/L (ref 135–145)

## 2018-02-07 LAB — GLUCOSE, CAPILLARY
GLUCOSE-CAPILLARY: 131 mg/dL — AB (ref 70–99)
Glucose-Capillary: 117 mg/dL — ABNORMAL HIGH (ref 70–99)
Glucose-Capillary: 133 mg/dL — ABNORMAL HIGH (ref 70–99)
Glucose-Capillary: 182 mg/dL — ABNORMAL HIGH (ref 70–99)

## 2018-02-07 LAB — HEMOGLOBIN A1C
Hgb A1c MFr Bld: 5.3 % (ref 4.8–5.6)
Mean Plasma Glucose: 105 mg/dL

## 2018-02-07 LAB — MAGNESIUM: MAGNESIUM: 2.6 mg/dL — AB (ref 1.7–2.4)

## 2018-02-07 LAB — FOLATE RBC
FOLATE, RBC: 1287 ng/mL (ref >498)
Folate, Hemolysate: 458.3 ng/mL
HEMATOCRIT: 35.6 % (ref 34.0–46.6)

## 2018-02-07 LAB — HIV ANTIBODY (ROUTINE TESTING W REFLEX): HIV Screen 4th Generation wRfx: NONREACTIVE

## 2018-02-07 MED ORDER — SODIUM CHLORIDE 0.9 % IV SOLN
INTRAVENOUS | Status: AC
  Administered 2018-02-07: 17:00:00 via INTRAVENOUS

## 2018-02-07 NOTE — Care Management Note (Addendum)
Case Management Note  Patient Details  Name: Holly Cortez MRN: [id number] Date of Birth: 04/12/1961  Subjective/Objective:    COPD exacerbation. Continues to smoke. Reportedly [facility]  [facility]  Would like a new PCP.  Gave patient a Special educational needs teacherresource packet including a PCP list.          Action/Plan: DC home. MATCH given. PCP list and resource packet given.  ADDENDUM: 02/08/2018: Patient has night time oxygen with [facility]. Home O2 eval ordered to assess need for continuous oxygen.   Expected Discharge Date:   02/07/2018               Expected Discharge Plan:  Home/Self Care  In-House Referral:     Discharge planning Services  CM Consult, MATCH Program, Other - See comment(provider list and resources)  Post Acute Care Choice:  NA Choice offered to:  NA  DME Arranged:    DME Agency:     HH Arranged:    HH Agency:     Status of Service:  Completed, signed off  If discussed at [employer] Length of Stay Meetings, dates discussed:    Additional Comments:  Dorraine Ellender, Chrystine OilerSharley Diane, RN 02/07/2018, 11:59 AM

## 2018-02-07 NOTE — Progress Notes (Signed)
PROGRESS NOTE  Holly Cortez ZOX:[id number] DOB: 1961/03/28 DOA: 02/05/2018 PCP: Massie Maroon, FNP  Brief History:  57 year old female with a history of COPD, bipolar disorder, tobacco abuse presenting with 3 to 4-day history of increasing shortness of breath and nonproductive cough.  The patient denies any fevers, chills, chest pain, nausea, vomiting, diarrhea.  She went to [facility] on 02/01/2018 where she was diagnosed with COPD exacerbation.  Patient received bronchodilators and was discharged from the emergency department with prednisone.  The patient stated that her shortness of breath and coughing did not improve.  As result, the patient presented for further evaluation on 02/05/2018.  The patient denies any recent travels, hemoptysis, headache, dysuria, hematuria.  She continues to smoke 1 to 2 cigarettes/day.  She has an approximately 15-pack-year history.  She denies any alcohol or illegal drug use.  Patient has not been able to afford her Symbicort.  Upon presentation, the patient was afebrile hemodynamically stable, but had hypoxia at 89% on room air.  The patient states that she has not seen a physician for nearly 3 years.  She has been getting all her home medications from [facility].  Assessment/Plan: Acute on chronic respiratory failure with hypoxia -Patient is normally on 2-4 L nasal cannula at nighttime -Presently stable on 1-2 L -Secondary to pneumonia and COPD exacerbation -Wean oxygen back to baseline  Lobar pneumonia -Personally reviewed chest x-ray--bibasilar infiltrates -Continue ceftriaxone and azithromycin  COPD exacerbation -Conitnue Pulmicort -Continue duo nebs -Continue IV Solu-Medrol>>>po prednisone  Tobacco abuse I have discussed tobacco cessation with the patient.  I have counseled the patient regarding the negative impacts of continued tobacco use including but not limited to lung cancer, COPD, and cardiovascular disease.  I have discussed  alternatives to tobacco and modalities that may help facilitate tobacco cessation including but not limited to biofeedback, hypnosis, and medications.  Total time spent with tobacco counseling was 4 minutes.  Chronic pain with sciatica -Continue gabapentin -Hydrocodone as needed breakthrough pain  Bipolar disorder -Continue bupropion, BuSpar  Hyperglycemia -check A1C--5.3 -novolog sliding scale    Disposition Plan:   Home 02/08/18 if stable Family Communication:  No Family at bedside  Consultants:  none  Code Status:  FULL  DVT Prophylaxis:  [place] Lovenox   Procedures: As Listed in Progress Note Above  Antibiotics: Ceftriaxone 9/14>>> azithro 9/14>>>   Subjective: Pt is breathing better.  She still has a nonproductive cough.  No n/v/d.  C/o some dyspnea with exertion without cp.  No headache  Objective: Vitals:   02/07/18 0544 02/07/18 0810 02/07/18 0815 02/07/18 1441  BP: 132/78     Pulse: 75     Resp: 16     Temp: 98.4 F (36.9 C)     TempSrc: Oral     SpO2: 93% 90% 90% (!) 85%  Weight:      Height:        Intake/Output Summary (Last 24 hours) at 02/07/2018 1621 Last data filed at 02/07/2018 1500 Gross per 24 hour  Intake 1443.33 ml  Output -  Net 1443.33 ml   Weight change:  Exam:   General:  Pt is alert, follows commands appropriately, not in acute distress  HEENT: No icterus, No thrush, No neck mass, [place]/AT  Cardiovascular: RRR, S1/S2, no rubs, no gallops  Respiratory: bibasilar crackles, no wheeze  Abdomen: Soft/+BS, non tender, non distended, no guarding  Extremities: No edema, No lymphangitis, No petechiae, No rashes, no synovitis  Data Reviewed: I have personally reviewed following labs and imaging studies Basic Metabolic Panel: Recent Labs  Lab 02/05/18 1947 02/06/18 0523 02/07/18 0425  NA 143 139 142  K 3.4* 4.0 5.3*  CL 109 108 107  CO2 24 24 27   GLUCOSE 110* 167* 122*  BUN 32* 28* 25*  CREATININE 1.03* 0.94  0.85  CALCIUM 8.6* 8.5* 9.2  MG  --  2.3 2.6*   Liver Function Tests: Recent Labs  Lab 02/05/18 1947  AST 13*  ALT 11  ALKPHOS 80  BILITOT 0.3  PROT 7.1  ALBUMIN 2.8*   No results for input(s): LIPASE, AMYLASE in the last 168 hours. No results for input(s): AMMONIA in the last 168 hours. Coagulation Profile: No results for input(s): INR, PROTIME in the last 168 hours. CBC: Recent Labs  Lab 02/05/18 1947 02/06/18 0523 02/06/18 1124  WBC 18.3* 18.4*  --   NEUTROABS 15.1*  --   --   HGB 13.0 11.8*  --   HCT 39.1 36.5 35.6  MCV 96.3 97.3  --   PLT 268 285  --    Cardiac Enzymes: No results for input(s): CKTOTAL, CKMB, CKMBINDEX, TROPONINI in the last 168 hours. BNP: Invalid input(s): POCBNP CBG: Recent Labs  Lab 02/06/18 1226 02/06/18 1605 02/06/18 2039 02/07/18 0806 02/07/18 1126  GLUCAP 174* 138* 105* 117* 133*   HbA1C: Recent Labs    02/06/18 1124  HGBA1C 5.3   Urine analysis:    Component Value Date/Time   COLORURINE AMBER (A) 07/08/2011 2142   APPEARANCEUR CLEAR 07/08/2011 2142   LABSPEC >=1.030 11/26/2016 1422   PHURINE 5.5 11/26/2016 1422   GLUCOSEU NEGATIVE 11/26/2016 1422   HGBUR NEGATIVE 11/26/2016 1422   BILIRUBINUR NEGATIVE 11/26/2016 1422   KETONESUR TRACE (A) 11/26/2016 1422   PROTEINUR NEGATIVE 11/26/2016 1422   UROBILINOGEN 0.2 11/26/2016 1422   NITRITE NEGATIVE 11/26/2016 1422   LEUKOCYTESUR TRACE (A) 11/26/2016 1422   Sepsis Labs: @LABRCNTIP (procalcitonin:4,lacticidven:4) )No results found for this or any previous visit (from the past 240 hour(s)).   Scheduled Meds: . azithromycin  500 mg Oral Q24H  . budesonide (PULMICORT) nebulizer solution  0.5 mg Nebulization BID  . buPROPion  100 mg Oral BID  . busPIRone  5 mg Oral TID  . enoxaparin (LOVENOX) injection  40 mg Subcutaneous Q24H  . gabapentin  300 mg Oral TID  . insulin aspart  0-9 Units Subcutaneous TID WC  . ipratropium-albuterol  3 mL Nebulization Q6H  . mouth rinse   15 mL Mouth Rinse BID  . methylPREDNISolone (SOLU-MEDROL) injection  40 mg Intravenous Q6H  . nicotine  7 mg Transdermal Daily   Continuous Infusions: . cefTRIAXone (ROCEPHIN)  IV 1 g (02/07/18 1159)    Procedures/Studies: Dg Chest 2 View  Result Date: 02/05/2018 CLINICAL DATA:  Shortness of breath over the last 4 days. History of pneumonia. EXAM: CHEST - 2 VIEW COMPARISON:  02/01/2018 FINDINGS: Heart size remains normal. Some aortic atherosclerosis. Worsening of bilateral pulmonary infiltrates, most pronounced in the left lower lobe. No dense consolidation or lobar collapse however. No effusions. IMPRESSION: Worsening of bilateral bronchopneumonia, most pronounced in the left lower lobe. Electronically Signed   By: Paulina Fusi M.D.   On: 02/05/2018 21:39   Dg Chest 2 View  Result Date: 02/01/2018 CLINICAL DATA:  Cough and congestion.  Chest pain for 2 weeks. EXAM: CHEST - 2 VIEW COMPARISON:  03/09/2016 FINDINGS: The cardiomediastinal contours are normal. Chronic bronchitic change. Pulmonary vasculature is normal. No consolidation,  pleural effusion, or pneumothorax. No acute osseous abnormalities are seen. IMPRESSION: Chronic bronchial thickening may be bronchitis, asthma, or smoking related lung disease. No acute findings. Electronically Signed   By: Narda RutherfordMelanie  Sanford M.D.   On: 02/01/2018 22:53    Catarina Hartshornavid Donne Baley, DO  [facility] Pager [phone]  If 7PM-7AM, please contact night-coverage [email or web address] Password [email or web address] 02/07/2018, 4:21 PM   LOS: 0 days

## 2018-02-08 LAB — GLUCOSE, CAPILLARY
GLUCOSE-CAPILLARY: 100 mg/dL — AB (ref 70–99)
Glucose-Capillary: 124 mg/dL — ABNORMAL HIGH (ref 70–99)
Glucose-Capillary: 114 mg/dL — ABNORMAL HIGH (ref 70–99)

## 2018-02-08 LAB — BASIC METABOLIC PANEL
Anion gap: 6 (ref 5–15)
BUN: 29 mg/dL — ABNORMAL HIGH (ref 6–20)
CALCIUM: 9 mg/dL (ref 8.9–10.3)
CHLORIDE: 110 mmol/L (ref 98–111)
CO2: 27 mmol/L (ref 22–32)
CREATININE: 0.94 mg/dL (ref 0.44–1.00)
GFR calc Af Amer: >60 mL/min (ref >60)
GFR calc non Af Amer: >60 mL/min (ref >60)
GLUCOSE: 111 mg/dL — AB (ref 70–99)
Potassium: 5.8 mmol/L — ABNORMAL HIGH (ref 3.5–5.1)
Sodium: 143 mmol/L (ref 135–145)

## 2018-02-08 MED ORDER — SODIUM POLYSTYRENE SULFONATE 15 GM/60ML PO SUSP
30.0000 g | Freq: Once | ORAL | Status: AC
  Administered 2018-02-08: 30 g via ORAL
  Filled 2018-02-08: qty 120

## 2018-02-08 MED ORDER — PREDNISONE 20 MG PO TABS
60.0000 mg | ORAL_TABLET | Freq: Every day | ORAL | Status: DC
  Administered 2018-02-09: 60 mg via ORAL
  Filled 2018-02-08: qty 3

## 2018-02-08 NOTE — Progress Notes (Signed)
SATURATION QUALIFICATIONS: (This note is used to comply with regulatory documentation for home oxygen)  Patient Saturations on Room Air at Rest = 87%  Patient Saturations on Room Air while Ambulating = %  Patient Saturations on  Liters of oxygen while Ambulating = %  Please briefly explain why patient needs home oxygen: patients O2 sats drop to 87% while on RA at rest.  Increased to 92% with 2L O2 [place] applied

## 2018-02-08 NOTE — Progress Notes (Signed)
PROGRESS NOTE  Holly Cortez ZOX:[id number] DOB: 08/28/1960 DOA: 02/05/2018 PCP: Massie MaroonHollis, Lachina M, FNP  Brief History: 57 year old female with a history of COPD, bipolar disorder, tobacco abuse presenting with 3 to 4-day history of increasing shortness of breath and nonproductive cough. The patient denies any fevers, chills, chest pain, nausea, vomiting, diarrhea. [facility] where she was diagnosed with COPD exacerbation. Patient received bronchodilators and was discharged from the emergency department with prednisone. The patient stated that her shortness of breath and coughing did not improve. As result, the patient presented for further evaluation on 02/05/2018. The patient denies any recent travels, hemoptysis, headache, dysuria, hematuria. She continues to smoke 1 to 2 cigarettes/day. She has an approximately 15-pack-year history. She denies any alcohol or illegal drug use. Patient has not been able to afford her Symbicort. Upon presentation, the patient was afebrile hemodynamically stable, but had hypoxia at 89% on room air. The patient states that she has not seen a physician for nearly 3 years. She has been getting all her home medications from [facility].  Assessment/Plan: Acute on chronic respiratory failure with hypoxia -Patient is normally on 2-4 L nasal cannula at nighttime -Presently stable on 2 L -Secondary to pneumonia and COPD exacerbation -ambulatory pulse ox showed desaturation <88%-->set up home oxygen 2L  Lobar pneumonia -Personally reviewed chest x-ray--bibasilar infiltrates -Continue ceftriaxone and azithromycin D#4  COPD exacerbation -Conitnue Pulmicort -Continue duo nebs -Continue IV Solu-Medrol>>>po prednisone taper  Hyperkalemia -kayexalate x 1 -repeat BMP in am  Tobacco abuse I have discussed tobacco cessation with the patient. I have counseled the patient regarding the negative impacts of continued tobacco use  including but not limited to lung cancer, COPD, and cardiovascular disease. I have discussed alternatives to tobacco and modalities that may help facilitate tobacco cessation including but not limited to biofeedback, hypnosis, and medications. Total time spent with tobacco counseling was 4 minutes.  Chronic pain with sciatica -Continue gabapentin -Hydrocodone as needed breakthrough pain  Bipolar disorder -Continue bupropion, BuSpar  Hyperglycemia -check A1C--5.3 -novolog sliding scale    Disposition Plan: Home 02/09/18 if potassium is normal Family Communication:NoFamily at bedside  Consultants:none  Code Status: FULL  DVT Prophylaxis: [place] Lovenox   Procedures: As Listed in Progress Note Above  Antibiotics: Ceftriaxone 9/14>>> azithro 9/14>>>     Subjective: Pt still has dyspnea with mild exertion but improving on IV steroids and abx Patient denies fevers, chills, headache, chest pain, nausea, vomiting, diarrhea, abdominal pain, dysuria, hematuria, hematochezia, and melena.   Objective: Vitals:   02/08/18 0653 02/08/18 0658 02/08/18 1330 02/08/18 1504  BP:    (!) 151/84  Pulse:    62  Resp:    18  Temp:    97.9 F (36.6 C)  TempSrc:    Oral  SpO2: 92% 92% (!) 87% 95%  Weight:      Height:        Intake/Output Summary (Last 24 hours) at 02/08/2018 1706 Last data filed at 02/08/2018 0900 Gross per 24 hour  Intake 3983.53 ml  Output -  Net 3983.53 ml   Weight change:  Exam:   General:  Pt is alert, follows commands appropriately, not in acute distress  HEENT: No icterus, No thrush, No neck mass, [place]/AT  Cardiovascular: RRR, S1/S2, no rubs, no gallops  Respiratory: bibasilar rales.  Scant bibasilar wheeze  Abdomen: Soft/+BS, non tender, non distended, no guarding  Extremities: No edema, No lymphangitis, No petechiae, No rashes,  no synovitis   Data Reviewed: I have personally reviewed following labs and imaging  studies Basic Metabolic Panel: Recent Labs  Lab 02/05/18 1947 02/06/18 0523 02/07/18 0425 02/08/18 0501  NA 143 139 142 143  K 3.4* 4.0 5.3* 5.8*  CL 109 108 107 110  CO2 24 24 27 27   GLUCOSE 110* 167* 122* 111*  BUN 32* 28* 25* 29*  CREATININE 1.03* 0.94 0.85 0.94  CALCIUM 8.6* 8.5* 9.2 9.0  MG  --  2.3 2.6*  --    Liver Function Tests: Recent Labs  Lab 02/05/18 1947  AST 13*  ALT 11  ALKPHOS 80  BILITOT 0.3  PROT 7.1  ALBUMIN 2.8*   No results for input(s): LIPASE, AMYLASE in the last 168 hours. No results for input(s): AMMONIA in the last 168 hours. Coagulation Profile: No results for input(s): INR, PROTIME in the last 168 hours. CBC: Recent Labs  Lab 02/05/18 1947 02/06/18 0523 02/06/18 1124  WBC 18.3* 18.4*  --   NEUTROABS 15.1*  --   --   HGB 13.0 11.8*  --   HCT 39.1 36.5 35.6  MCV 96.3 97.3  --   PLT 268 285  --    Cardiac Enzymes: No results for input(s): CKTOTAL, CKMB, CKMBINDEX, TROPONINI in the last 168 hours. BNP: Invalid input(s): POCBNP CBG: Recent Labs  Lab 02/07/18 1717 02/07/18 2215 02/08/18 0811 02/08/18 1115 02/08/18 1638  GLUCAP 131* 182* 124* 114* 100*   HbA1C: Recent Labs    02/06/18 1124  HGBA1C 5.3   Urine analysis:    Component Value Date/Time   COLORURINE AMBER (A) 07/08/2011 2142   APPEARANCEUR CLEAR 07/08/2011 2142   LABSPEC >=1.030 11/26/2016 1422   PHURINE 5.5 11/26/2016 1422   GLUCOSEU NEGATIVE 11/26/2016 1422   HGBUR NEGATIVE 11/26/2016 1422   BILIRUBINUR NEGATIVE 11/26/2016 1422   KETONESUR TRACE (A) 11/26/2016 1422   PROTEINUR NEGATIVE 11/26/2016 1422   UROBILINOGEN 0.2 11/26/2016 1422   NITRITE NEGATIVE 11/26/2016 1422   LEUKOCYTESUR TRACE (A) 11/26/2016 1422   Sepsis Labs: @LABRCNTIP (procalcitonin:4,lacticidven:4) )No results found for this or any previous visit (from the past 240 hour(s)).   Scheduled Meds: . azithromycin  500 mg Oral Q24H  . budesonide (PULMICORT) nebulizer solution  0.5 mg  Nebulization BID  . buPROPion  100 mg Oral BID  . busPIRone  5 mg Oral TID  . enoxaparin (LOVENOX) injection  40 mg Subcutaneous Q24H  . gabapentin  300 mg Oral TID  . insulin aspart  0-9 Units Subcutaneous TID WC  . ipratropium-albuterol  3 mL Nebulization Q6H  . mouth rinse  15 mL Mouth Rinse BID  . methylPREDNISolone (SOLU-MEDROL) injection  40 mg Intravenous Q6H  . nicotine  7 mg Transdermal Daily   Continuous Infusions: . cefTRIAXone (ROCEPHIN)  IV 1 g (02/08/18 1219)    Procedures/Studies: Dg Chest 2 View  Result Date: 02/05/2018 CLINICAL DATA:  Shortness of breath over the last 4 days. History of pneumonia. EXAM: CHEST - 2 VIEW COMPARISON:  02/01/2018 FINDINGS: Heart size remains normal. Some aortic atherosclerosis. Worsening of bilateral pulmonary infiltrates, most pronounced in the left lower lobe. No dense consolidation or lobar collapse however. No effusions. IMPRESSION: Worsening of bilateral bronchopneumonia, most pronounced in the left lower lobe. Electronically Signed   By: Paulina Fusi M.D.   On: 02/05/2018 21:39   Dg Chest 2 View  Result Date: 02/01/2018 CLINICAL DATA:  Cough and congestion.  Chest pain for 2 weeks. EXAM: CHEST - 2 VIEW COMPARISON:  03/09/2016 FINDINGS: The cardiomediastinal contours are normal. Chronic bronchitic change. Pulmonary vasculature is normal. No consolidation, pleural effusion, or pneumothorax. No acute osseous abnormalities are seen. IMPRESSION: Chronic bronchial thickening may be bronchitis, asthma, or smoking related lung disease. No acute findings. Electronically Signed   By: Narda Rutherford M.D.   On: 02/01/2018 22:53    Catarina Hartshorn, DO  [facility] Pager [phone]  If 7PM-7AM, please contact night-coverage [email or web address] Password [email or web address] 02/08/2018, 5:06 PM   LOS: 1 day

## 2018-02-08 NOTE — Care Management (Addendum)
Patient will now need continuous oxygen. Has night time oxygen with [facility]. [facility]

## 2018-02-09 DIAGNOSIS — J189 Pneumonia, unspecified organism: Secondary | ICD-10-CM

## 2018-02-09 DIAGNOSIS — J441 Chronic obstructive pulmonary disease with (acute) exacerbation: Principal | ICD-10-CM

## 2018-02-09 DIAGNOSIS — J181 Lobar pneumonia, unspecified organism: Secondary | ICD-10-CM

## 2018-02-09 DIAGNOSIS — J9621 Acute and chronic respiratory failure with hypoxia: Secondary | ICD-10-CM

## 2018-02-09 DIAGNOSIS — Z720 Tobacco use: Secondary | ICD-10-CM

## 2018-02-09 LAB — BASIC METABOLIC PANEL
Anion gap: 6 (ref 5–15)
BUN: 27 mg/dL — ABNORMAL HIGH (ref 6–20)
CO2: 28 mmol/L (ref 22–32)
Calcium: 8.6 mg/dL — ABNORMAL LOW (ref 8.9–10.3)
Chloride: 109 mmol/L (ref 98–111)
Creatinine, Ser: 0.78 mg/dL (ref 0.44–1.00)
GFR calc Af Amer: >60 mL/min (ref >60)
Glucose, Bld: 86 mg/dL (ref 70–99)
POTASSIUM: 4.2 mmol/L (ref 3.5–5.1)
SODIUM: 143 mmol/L (ref 135–145)

## 2018-02-09 LAB — GLUCOSE, CAPILLARY
Glucose-Capillary: 72 mg/dL (ref 70–99)
Glucose-Capillary: 95 mg/dL (ref 70–99)

## 2018-02-09 MED ORDER — AMOXICILLIN-POT CLAVULANATE 875-125 MG PO TABS: 1.0000 | ORAL_TABLET | Freq: Two times a day (BID) | ORAL | 0 refills | Status: AC

## 2018-02-09 MED ORDER — ALBUTEROL SULFATE (2.5 MG/3ML) 0.083% IN NEBU: 2.5000 mg | INHALATION_SOLUTION | Freq: Four times a day (QID) | RESPIRATORY_TRACT | 12 refills | Status: AC | PRN

## 2018-02-09 MED ORDER — GUAIFENESIN ER 600 MG PO TB12: 600.0000 mg | ORAL_TABLET | Freq: Two times a day (BID) | ORAL | 2 refills | Status: AC

## 2018-02-09 MED ORDER — PREDNISONE 10 MG PO TABS: ORAL_TABLET | ORAL | 0 refills | Status: DC

## 2018-02-09 NOTE — Care Management (Signed)
Patient's port O2 tank is empty. [facility]

## 2018-02-09 NOTE — Discharge Summary (Signed)
Physician Discharge Summary  Holly Cortez ZOX:[id number] DOB: Feb 05, 1961 DOA: 02/05/2018  PCP: Massie Maroon, FNP  Admit date: 02/05/2018 Discharge date: 02/09/2018  Admitted From: home Disposition:  home  Recommendations for Outpatient Follow-up:  1. Follow up with PCP in 1-2 weeks 2. Please obtain BMP/CBC in one week 3. Repeat chest xray in 3-4 weeks to ensure resolution of pneumonia  Discharge Condition: stable CODE STATUS: full code Diet recommendation:  Heart healthy  Brief/Interim Summary: 57 year old female with a history of COPD, bipolar disorder, tobacco abuse presenting with 3 to 4-day history of increasing shortness of breath and nonproductive cough. The patient denies any fevers, chills, chest pain, nausea, vomiting, diarrhea. She went to [facility] 02/01/2018 where she was diagnosed with COPD exacerbation. Patient received bronchodilators and was discharged from the emergency department with prednisone. The patient stated that her shortness of breath and coughing did not improve. As result, the patient presented for further evaluation on 02/05/2018. The patient denies any recent travels, hemoptysis, headache, dysuria, hematuria. She continues to smoke 1 to 2 cigarettes/day. She has an approximately 15-pack-year history. She denies any alcohol or illegal drug use. Patient has not been able to afford her Symbicort. Upon presentation, the patient was afebrile hemodynamically stable, but had hypoxia at 89% on room air. The patient states that she has not seen a physician for nearly 3 years. She has been getting all her home medications from [place].  Discharge Diagnoses:  Principal Problem:   COPD with acute exacerbation (HCC) Active Problems:   Pneumonia   Emphysema of lung (HCC)   Right-sided low back pain with right-sided sciatica   Bipolar disorder (HCC)   Tobacco abuse   Acute on chronic respiratory failure with hypoxia (HCC)   Lobar pneumonia (HCC)  Acute  on chronic respiratory failure with hypoxia -Patient is normally on2-4 L nasal cannula at nighttime -Presently stable on 2 L -Secondary to pneumonia and COPD exacerbation -ambulatory pulse ox showed desaturation <88%-->set up home oxygen 2L  Lobar pneumonia -patient was treated with ceftriaxone and azithromycin -transition to augmentin -repeat chest xray in 3-4 weeks  COPD exacerbation - treated withPulmicort -treated with duo nebs -treated with solumedrol and transitioned to prednisone taper -will arrange neb machine at home and provide albuterol  Hyperkalemia -resolved with kayexalate -hold further ACE/ARB  Tobacco abuse Counseled on the importance of tobacco cessation  Chronic pain with sciatica -Continue gabapentin -Hydrocodone as needed breakthrough pain  Bipolar disorder -Continue bupropion, BuSpar  Hyperglycemia -check A1C--5.3 -likely related to steroids  Discharge Instructions  Discharge Instructions    DME Nebulizer machine   Complete by:  As directed    Patient needs a nebulizer to treat with the following condition:  COPD (chronic obstructive pulmonary disease) (HCC)   Diet - low sodium heart healthy   Complete by:  As directed    Increase activity slowly   Complete by:  As directed      Allergies as of 02/09/2018   No Known Allergies     Medication List    TAKE these medications   albuterol 108 (90 Base) MCG/ACT inhaler Commonly known as:  PROVENTIL HFA;VENTOLIN HFA Inhale 2 puffs into the lungs every 6 (six) hours as needed for wheezing or shortness of breath. What changed:  Another medication with the same name was added. Make sure you understand how and when to take each.   albuterol (2.5 MG/3ML) 0.083% nebulizer solution Commonly known as:  PROVENTIL Take 3 mLs (2.5 mg total) by nebulization every 6 (  six) hours as needed for wheezing or shortness of breath. What changed:  You were already taking a medication with the same name,  and this prescription was added. Make sure you understand how and when to take each.   amoxicillin-clavulanate 875-125 MG tablet Commonly known as:  AUGMENTIN Take 1 tablet by mouth 2 (two) times daily for 3 days.   aspirin EC 325 MG tablet Take 975 mg by mouth 3 (three) times daily as needed for mild pain or moderate pain.   buPROPion 100 MG tablet Commonly known as:  WELLBUTRIN Take 100 mg by mouth 2 (two) times daily.   busPIRone 5 MG tablet Commonly known as:  BUSPAR Take 5 mg by mouth 3 (three) times daily.   DELSYM 30 MG/5ML liquid Generic drug:  dextromethorphan Take 60 mg by mouth daily as needed for cough.   DEPAKOTE PO Take 250 mg by mouth at bedtime as needed (for sleep).   gabapentin 300 MG capsule Commonly known as:  NEURONTIN Take 300 mg by mouth 3 (three) times daily.   guaiFENesin 600 MG 12 hr tablet Commonly known as:  MUCINEX Take 1 tablet (600 mg total) by mouth 2 (two) times daily.   predniSONE 10 MG tablet Commonly known as:  DELTASONE Take 40mg  po daily for 2 days then 30mg  daily for 2 days then 20mg  daily for 2 days then 10mg  daily for 2 days then stop What changed:    medication strength  how much to take  how to take this  when to take this  additional instructions   zolpidem 10 MG tablet Commonly known as:  AMBIEN Take 10 mg by mouth at bedtime as needed for sleep.            Durable Medical Equipment  (From admission, onward)         Start     Ordered   02/09/18 0000  DME Nebulizer machine    Question:  Patient needs a nebulizer to treat with the following condition  Answer:  COPD (chronic obstructive pulmonary disease) (HCC)   02/09/18 1201         Follow-up Information    Massie Maroon, FNP. Schedule an appointment as soon as possible for a visit in 1 week(s).   Specialty:  Family Medicine Why:  Call for follow-up appointment.  Contact information: [address] [place]  [address] [phone]        [facility] Follow up.   Why:  walk in clinic on Fridays- please go tomorrow and they will assist you with getting a follow up appointment. Contact information: [phone]  [address], [place] [address]         No Known Allergies  Consultations:     Procedures/Studies: Dg Chest 2 View  Result Date: 02/05/2018 CLINICAL DATA:  Shortness of breath over the last 4 days. History of pneumonia. EXAM: CHEST - 2 VIEW COMPARISON:  02/01/2018 FINDINGS: Heart size remains normal. Some aortic atherosclerosis. Worsening of bilateral pulmonary infiltrates, most pronounced in the left lower lobe. No dense consolidation or lobar collapse however. No effusions. IMPRESSION: Worsening of bilateral bronchopneumonia, most pronounced in the left lower lobe. Electronically Signed   By: Paulina Fusi M.D.   On: 02/05/2018 21:39   Dg Chest 2 View  Result Date: 02/01/2018 CLINICAL DATA:  Cough and congestion.  Chest pain for 2 weeks. EXAM: CHEST - 2 VIEW COMPARISON:  03/09/2016 FINDINGS: The  cardiomediastinal contours are normal. Chronic bronchitic change. Pulmonary vasculature is normal. No consolidation, pleural effusion, or pneumothorax. No acute osseous abnormalities are seen. IMPRESSION: Chronic bronchial thickening may be bronchitis, asthma, or smoking related lung disease. No acute findings. Electronically Signed   By: Narda Rutherford M.D.   On: 02/01/2018 22:53       Subjective: Shortness of breath is better. Wheezing resolved. Continues to have some productive cough  Discharge Exam: Vitals:   02/08/18 2141 02/09/18 0606 02/09/18 0829 02/09/18 1502  BP: (!) 154/85 (!) 150/86    Pulse: 69 (!) 55    Resp:      Temp: 97.7 F (36.5 C) (!) 97.5 F (36.4 C)    TempSrc: Oral Oral    SpO2: 95% 97% 96% 99%  Weight:      Height:        General: Pt is alert, awake, not in acute distress Cardiovascular: RRR, S1/S2  +, no rubs, no gallops Respiratory: CTA bilaterally, no wheezing, no rhonchi Abdominal: Soft, NT, ND, bowel sounds + Extremities: no edema, no cyanosis    The results of significant diagnostics from this hospitalization (including imaging, microbiology, ancillary and laboratory) are listed below for reference.     Microbiology: No results found for this or any previous visit (from the past 240 hour(s)).   Labs: BNP (last 3 results) No results for input(s): BNP in the last 8760 hours. Basic Metabolic Panel: Recent Labs  Lab 02/05/18 1947 02/06/18 0523 02/07/18 0425 02/08/18 0501 02/09/18 0520  NA 143 139 142 143 143  K 3.4* 4.0 5.3* 5.8* 4.2  CL 109 108 107 110 109  CO2 24 24 27 27 28   GLUCOSE 110* 167* 122* 111* 86  BUN 32* 28* 25* 29* 27*  CREATININE 1.03* 0.94 0.85 0.94 0.78  CALCIUM 8.6* 8.5* 9.2 9.0 8.6*  MG  --  2.3 2.6*  --   --    Liver Function Tests: Recent Labs  Lab 02/05/18 1947  AST 13*  ALT 11  ALKPHOS 80  BILITOT 0.3  PROT 7.1  ALBUMIN 2.8*   No results for input(s): LIPASE, AMYLASE in the last 168 hours. No results for input(s): AMMONIA in the last 168 hours. CBC: Recent Labs  Lab 02/05/18 1947 02/06/18 0523 02/06/18 1124  WBC 18.3* 18.4*  --   NEUTROABS 15.1*  --   --   HGB 13.0 11.8*  --   HCT 39.1 36.5 35.6  MCV 96.3 97.3  --   PLT 268 285  --    Cardiac Enzymes: No results for input(s): CKTOTAL, CKMB, CKMBINDEX, TROPONINI in the last 168 hours. BNP: Invalid input(s): POCBNP CBG: Recent Labs  Lab 02/08/18 0811 02/08/18 1115 02/08/18 1638 02/09/18 0733 02/09/18 1118  GLUCAP 124* 114* 100* 72 95   D-Dimer No results for input(s): DDIMER in the last 72 hours. Hgb A1c No results for input(s): HGBA1C in the last 72 hours. Lipid Profile No results for input(s): CHOL, HDL, LDLCALC, TRIG, CHOLHDL, LDLDIRECT in the last 72 hours. Thyroid function studies No results for input(s): TSH, T4TOTAL, T3FREE, THYROIDAB in the last 72  hours.  Invalid input(s): FREET3 Anemia work up No results for input(s): VITAMINB12, FOLATE, FERRITIN, TIBC, IRON, RETICCTPCT in the last 72 hours. Urinalysis    Component Value Date/Time   COLORURINE AMBER (A) 07/08/2011 2142   APPEARANCEUR CLEAR 07/08/2011 2142   LABSPEC >=1.030 11/26/2016 1422   PHURINE 5.5 11/26/2016 1422   GLUCOSEU NEGATIVE 11/26/2016 1422   HGBUR NEGATIVE  11/26/2016 1422   BILIRUBINUR NEGATIVE 11/26/2016 1422   KETONESUR TRACE (A) 11/26/2016 1422   PROTEINUR NEGATIVE 11/26/2016 1422   UROBILINOGEN 0.2 11/26/2016 1422   NITRITE NEGATIVE 11/26/2016 1422   LEUKOCYTESUR TRACE (A) 11/26/2016 1422   Sepsis Labs Invalid input(s): PROCALCITONIN,  WBC,  LACTICIDVEN Microbiology No results found for this or any previous visit (from the past 240 hour(s)).   Time coordinating discharge: 35mins  SIGNED:   Erick BlinksJehanzeb Kaydince Towles, MD  [facility] 02/09/2018, 8:04 PM Pager   If 7PM-7AM, please contact night-coverage [email or web address] Password [email or web address]

## 2018-02-09 NOTE — Progress Notes (Signed)
Discharge instructions reviewed with patient this afternoon. Given copy of AVS and prescriptions.verbalized understanding of instructions, COPD education, when to follow-up and seek medical attention. Discussed smoking cessation education as well. Verbalized understanding and states she watched smoking cessation and COPD education videos. IV site removed, site within normal limits. Home health rep to bring her home nebulizer machine prior to discharge. Patient requested prescriptions be [facility] to [facility]. Discussed with MD. Stated he would send them and also print in case of any issues since she has not used this pharmacy in the past. Patient verbalized understanding. Patient in stable condition awaiting her family arrival for discharge home. Earnstine RegalAshley Hazle Ogburn, RN

## 2018-02-09 NOTE — Care Management Note (Addendum)
Case Management Note  Patient Details  Name: Holly Cortez MRN: [id number] Date of Birth: 12/22/1960  Subjective/Objective:                    Action/Plan: Discharging home today. Sister to pick up patient and bring port O2 tank for transport.  Patient will need a neb  Machine. [facility] and will deliver to room. Discussed [facility] with patient to assist with follow up. Patient can do an walk - in appt tomorrow. She agrees to follow up. Patient also has resource packet for area and MATCH.   Expected Discharge Date:     02/09/2018             Expected Discharge Plan:  Home/Self Care  In-House Referral:     Discharge planning Services  CM Consult, MATCH Program, Other - See comment(provider list and resources)  Post Acute Care Choice:  NA Choice offered to:  NA  DME Arranged:    DME Agency:     HH Arranged:    HH Agency:     Status of Service:  Completed, signed off  If discussed at [employer] Length of Stay Meetings, dates discussed:    Additional Comments:  Laelyn Blumenthal, Chrystine OilerSharley Diane, RN 02/09/2018, 11:46 AM

## 2018-02-09 NOTE — Progress Notes (Signed)
[facility]. Patient left floor in stable condition via w/c accompanied by nurse tech. Discharged home with sister. Earnstine RegalAshley Donielle Radziewicz, RN

## 2018-02-09 NOTE — Discharge Instructions (Signed)
Chronic Obstructive Pulmonary Disease Exacerbation  Chronic obstructive pulmonary disease (COPD) is a common lung problem. In COPD, the flow of air from the lungs is limited. COPD exacerbations are times that breathing gets worse and you need extra treatment. Without treatment they can be life threatening. If they happen often, your lungs can become more damaged. If your COPD gets worse, your doctor may treat you with:  ? Medicines.  ? Oxygen.  ? Different ways to clear your airway, such as using a mask.    Follow these instructions at home:  ? Do not smoke.  ? Avoid tobacco smoke and other things that bother your lungs.  ? If given, take your antibiotic medicine as told. Finish the medicine even if you start to feel better.  ? Only take medicines as told by your doctor.  ? Drink enough fluids to keep your pee (urine) clear or pale yellow (unless your doctor has told you not to).  ? Use a cool mist machine (vaporizer).  ? If you use oxygen or a machine that turns liquid medicine into a mist (nebulizer), continue to use them as told.  ? Keep up with shots (vaccinations) as told by your doctor.  ? Exercise regularly.  ? Eat healthy foods.  ? Keep all doctor visits as told.  Get help right away if:  ? You are very short of breath and it gets worse.  ? You have trouble talking.  ? You have bad chest pain.  ? You have blood in your spit (sputum).  ? You have a fever.  ? You keep throwing up (vomiting).  ? You feel weak, or you pass out (faint).  ? You feel confused.  ? You keep getting worse.  This information is not intended to replace advice given to you by your health care provider. Make sure you discuss any questions you have with your health care provider.  Document Released: 04/30/2011 Document Revised: 10/17/2015 Document Reviewed: 01/13/2013  Elsevier Interactive Patient Education ? 2017 Elsevier Inc.

## 2018-09-23 NOTE — Telephone Encounter (Signed)
Message sent to provider 

## 2019-11-24 ENCOUNTER — Telehealth (INDEPENDENT_AMBULATORY_CARE_PROVIDER_SITE_OTHER): Payer: No Payment, Other | Admitting: Psychiatric/Mental Health

## 2019-11-24 ENCOUNTER — Other Ambulatory Visit: Payer: Self-pay

## 2019-11-24 DIAGNOSIS — F411 Generalized anxiety disorder: Secondary | ICD-10-CM | POA: Diagnosis not present

## 2019-11-24 DIAGNOSIS — Z720 Tobacco use: Secondary | ICD-10-CM | POA: Diagnosis not present

## 2019-11-24 DIAGNOSIS — F3160 Bipolar disorder, current episode mixed, unspecified: Secondary | ICD-10-CM

## 2019-11-24 DIAGNOSIS — G4700 Insomnia, unspecified: Secondary | ICD-10-CM | POA: Diagnosis not present

## 2019-11-24 DIAGNOSIS — F5104 Psychophysiologic insomnia: Secondary | ICD-10-CM | POA: Insufficient documentation

## 2019-11-24 MED ORDER — GABAPENTIN 400 MG PO CAPS: 800.0000 mg | ORAL_CAPSULE | Freq: Three times a day (TID) | ORAL | 2 refills | Status: DC

## 2019-11-24 MED ORDER — DIVALPROEX SODIUM 500 MG PO DR TAB: 500.0000 mg | DELAYED_RELEASE_TABLET | Freq: Two times a day (BID) | ORAL | 2 refills | Status: DC

## 2019-11-24 MED ORDER — BUSPIRONE HCL 15 MG PO TABS: 15.0000 mg | ORAL_TABLET | Freq: Three times a day (TID) | ORAL | 2 refills | Status: AC

## 2019-11-24 MED ORDER — ZOLPIDEM TARTRATE 10 MG PO TABS: 10.0000 mg | ORAL_TABLET | Freq: Every day | ORAL | 2 refills | Status: DC

## 2019-11-24 MED ORDER — BUPROPION HCL ER (SR) 150 MG PO TB12: 150.0000 mg | ORAL_TABLET | Freq: Two times a day (BID) | ORAL | 2 refills | Status: DC

## 2019-11-27 ENCOUNTER — Encounter (HOSPITAL_COMMUNITY): Payer: Self-pay | Admitting: Psychiatric/Mental Health

## 2019-11-27 NOTE — Progress Notes (Signed)
Psychiatric Initial Adult Assessment   Virtual Visit via Video Note  I connected with Holly Cortez   on 11/24/19  by a video enabled telemedicine application and verified that I am speaking with the correct person using two identifiers.  Location: Patient: Home Provider: Home office  I discussed the limitations of evaluation and management by telemedicine and the availability of in person appointments. The patient expressed understanding and agreed to proceed.  I provided 45 minutes of non-face-to-face time during this encounter.   Patient Identification: Holly Cortez MRN:  [id number] Date of Evaluation:  11/27/2019 Referral Source: Vesta Mixer Chief Complaint:  " My mood is good" Visit Diagnosis:    ICD-10-CM   1. Insomnia, unspecified type  G47.00 zolpidem (AMBIEN) 10 MG tablet  2. Tobacco abuse  Z72.0   3. Bipolar affective disorder, current episode mixed, current episode severity unspecified (HCC)  F31.60 buPROPion (WELLBUTRIN SR) 150 MG 12 hr tablet  4. GAD (generalized anxiety disorder)  F41.1 gabapentin (NEURONTIN) 400 MG capsule    busPIRone (BUSPAR) 15 MG tablet    divalproex (DEPAKOTE) 500 MG DR tablet    buPROPion (WELLBUTRIN SR) 150 MG 12 hr tablet    History of Present Illness:  Holly Cortez is a  59 year-old female seen today for initial psych evaluation.  Patient was referred to outpatient psychiatry by [facility] for medication management.  Holly Cortez has been referred here from [facility] with a history of insomnia, bipolar affective disorder, and general anxiety disorder.  On assessment Holly Cortez reports her mood is good, she says her sleep is hit and miss due to new hours at her job, she reports good appetite and she denies SI, HI, and AVH.  Her last visit as at [facility] was in April where she was steady with her appointments up until she received a letter to come to [facility].  After chart review of calls charting system, it has been determined that Holly Cortez is currently prescribed Ambien 10 mg for sleep as  needed, Wellbutrin 150 mg ER, gabapentin 800 mg 3 times daily, Depakote 500 mg 3 times daily and BuSpar 15 mg 3 times daily.  She states that she has been on this medication regimen for years and has had no side effects and is effective in maintaining her bipolar disorder symptoms.  She does not wish to have any medication changes and has just asked for refills of such.  Due to patient's stability patient has agreed to follow-up with writer in 12 weeks for medication management appointment.  No further concerns at this time.  Associated Signs/Symptoms: Depression Symptoms:  disturbed sleep, (Hypo) Manic Symptoms:  na Anxiety Symptoms:  na Psychotic Symptoms:  na PTSD Symptoms: NA  Past Psychiatric History: Bipolar disorder  Previous Psychotropic Medications: Yes   Substance Abuse History in the last 12 months:  No.  Consequences of Substance Abuse: NA  Past Medical History:  Past Medical History:  Diagnosis Date  . Bipolar disorder (HCC)   . Emphysema lung (HCC)   . Emphysema of lung (HCC)   . Pneumonia     Past Surgical History:  Procedure Laterality Date  . APPENDECTOMY    . FINGER AMPUTATION Left     Family Psychiatric History: unknown  Family History:  Family History  Problem Relation Age of Onset  . Heart disease Mother   . Hyperlipidemia Father     Social History:   Social History   Socioeconomic History  . Marital status: Single    Spouse name: Not on file  .  Number of children: Not on file  . Years of education: Not on file  . Highest education level: Not on file  Occupational History  . Not on file  Tobacco Use  . Smoking status: Current Every Day Smoker    Packs/day: 0.25    Types: Cigarettes  . Smokeless tobacco: Never Used  Vaping Use  . Vaping Use: Never used  Substance and Sexual Activity  . Alcohol use: No  . Drug use: No  . Sexual activity: Yes    Birth control/protection: Post-menopausal  Other Topics Concern  . Not on file  Social  History Narrative  . Not on file   Social Determinants of Health   Financial Resource Strain:   . Difficulty of Paying Living Expenses:   Food Insecurity:   . Worried About Programme researcher, broadcasting/film/video in the Last Year:   . Barista in the Last Year:   Transportation Needs:   . Freight forwarder (Medical):   Marland Kitchen Lack of Transportation (Non-Medical):   Physical Activity:   . Days of Exercise per Week:   . Minutes of Exercise per Session:   Stress:   . Feeling of Stress :   Social Connections:   . Frequency of Communication with Friends and Family:   . Frequency of Social Gatherings with Friends and Family:   . Attends Religious Services:   . Active Member of Clubs or Organizations:   . Attends Banker Meetings:   Marland Kitchen Marital Status:     Additional Social History: unknown  Allergies:  No Known Allergies  Metabolic Disorder Labs: Lab Results  Component Value Date   HGBA1C 5.3 02/06/2018   MPG 105 02/06/2018   MPG 117 (H) 05/10/2015   No results found for: PROLACTIN No results found for: CHOL, TRIG, HDL, CHOLHDL, VLDL, LDLCALC Lab Results  Component Value Date   TSH 0.274 (L) 05/10/2015    Therapeutic Level Labs: No results found for: LITHIUM No results found for: CBMZ No results found for: VALPROATE  Current Medications: Current Outpatient Medications  Medication Sig Dispense Refill  . albuterol (PROVENTIL HFA;VENTOLIN HFA) 108 (90 BASE) MCG/ACT inhaler Inhale 2 puffs into the lungs every 6 (six) hours as needed for wheezing or shortness of breath. 1 Inhaler 2  . albuterol (PROVENTIL) (2.5 MG/3ML) 0.083% nebulizer solution Take 3 mLs (2.5 mg total) by nebulization every 6 (six) hours as needed for wheezing or shortness of breath. 75 mL 12  . aspirin EC 325 MG tablet Take 975 mg by mouth 3 (three) times daily as needed for mild pain or moderate pain.    Marland Kitchen buPROPion (WELLBUTRIN SR) 150 MG 12 hr tablet Take 1 tablet (150 mg total) by mouth 2 (two) times  daily. 60 tablet 2  . busPIRone (BUSPAR) 15 MG tablet Take 1 tablet (15 mg total) by mouth 3 (three) times daily. 90 tablet 2  . dextromethorphan (DELSYM) 30 MG/5ML liquid Take 60 mg by mouth daily as needed for cough.    . divalproex (DEPAKOTE) 500 MG DR tablet Take 1 tablet (500 mg total) by mouth 2 (two) times daily. 60 tablet 2  . gabapentin (NEURONTIN) 400 MG capsule Take 2 capsules (800 mg total) by mouth 3 (three) times daily. 180 capsule 2  . predniSONE (DELTASONE) 10 MG tablet Take 40mg  po daily for 2 days then 30mg  daily for 2 days then 20mg  daily for 2 days then 10mg  daily for 2 days then stop 20 tablet 0  .  zolpidem (AMBIEN) 10 MG tablet Take 1 tablet (10 mg total) by mouth at bedtime. 30 tablet 2   No current facility-administered medications for this visit.    Musculoskeletal: Strength & Muscle Tone: within normal limits Gait & Station: normal Patient leans: N/A  Psychiatric Specialty Exam: Review of Systems  Last menstrual period 05/27/2011.There is no height or weight on file to calculate BMI.  General Appearance: Casual  Eye Contact:  Good  Speech:  Clear and Coherent  Volume:  Normal  Mood:  Euthymic  Affect:  Congruent  Thought Process:  Coherent and Descriptions of Associations: Intact  Orientation:  Full (Time, Place, and Person)  Thought Content:  Logical  Suicidal Thoughts:  No  Homicidal Thoughts:  No  Memory:  Immediate;   Good  Judgement:  Good  Insight:  Good  Psychomotor Activity:  Normal  Concentration:  Concentration: Good  Recall:  Good  Fund of Knowledge:Good  Language: Good  Akathisia:  NA  Handed:  Right  AIMS (if indicated):  not done  Assets:  Communication Skills Desire for Improvement Financial Resources/Insurance  ADL's:  Intact  Cognition: WNL  Sleep:  Good   Screenings: PHQ2-9     Office Visit from 11/26/2016 in [facility] Office Visit from 08/27/2016 in [facility] Office Visit from  02/21/2016 in [facility] Office Visit from 08/12/2015 in [facility] Office Visit from 05/31/2015 in [facility]  PHQ-2 Total Score 0 0 0 0 0      Assessment and Plan: : Patient reports that she is doing well on current medication regimen.  She is agreeable to continue medications as prescribed and requested medication refills. Patient agrees to follow up in 12 weeks.   ICD-10-CM   1. Insomnia, unspecified type  G47.00 zolpidem (AMBIEN) 10 MG tablet  2. Tobacco abuse  Z72.0   3. Bipolar affective disorder, current episode mixed, current episode severity unspecified (HCC)  F31.60 buPROPion (WELLBUTRIN SR) 150 MG 12 hr tablet  4. GAD (generalized anxiety disorder)  F41.1 gabapentin (NEURONTIN) 400 MG capsule    busPIRone (BUSPAR) 15 MG tablet    divalproex (DEPAKOTE) 500 MG DR tablet    buPROPion (WELLBUTRIN SR) 150 MG 12 hr tablet       Jearld Lesch, NP 7/5/20213:54 AM

## 2020-02-23 ENCOUNTER — Telehealth (HOSPITAL_COMMUNITY): Payer: Self-pay

## 2020-03-12 ENCOUNTER — Encounter (HOSPITAL_COMMUNITY): Payer: Self-pay | Admitting: Psychiatry

## 2020-03-12 ENCOUNTER — Telehealth (INDEPENDENT_AMBULATORY_CARE_PROVIDER_SITE_OTHER): Payer: No Payment, Other | Admitting: Psychiatry

## 2020-03-12 ENCOUNTER — Other Ambulatory Visit: Payer: Self-pay

## 2020-03-12 DIAGNOSIS — F411 Generalized anxiety disorder: Secondary | ICD-10-CM

## 2020-03-12 DIAGNOSIS — G4700 Insomnia, unspecified: Secondary | ICD-10-CM | POA: Diagnosis not present

## 2020-03-12 DIAGNOSIS — F3160 Bipolar disorder, current episode mixed, unspecified: Secondary | ICD-10-CM | POA: Insufficient documentation

## 2020-03-12 MED ORDER — GABAPENTIN 400 MG PO CAPS: 800.0000 mg | ORAL_CAPSULE | Freq: Three times a day (TID) | ORAL | 2 refills | Status: DC

## 2020-03-12 MED ORDER — BUPROPION HCL ER (SR) 150 MG PO TB12: 150.0000 mg | ORAL_TABLET | Freq: Two times a day (BID) | ORAL | 2 refills | Status: DC

## 2020-03-12 MED ORDER — ZOLPIDEM TARTRATE 10 MG PO TABS: 10.0000 mg | ORAL_TABLET | Freq: Every day | ORAL | 2 refills | Status: DC

## 2020-03-12 MED ORDER — DIVALPROEX SODIUM 500 MG PO DR TAB: 500.0000 mg | DELAYED_RELEASE_TABLET | Freq: Two times a day (BID) | ORAL | 2 refills | Status: DC

## 2020-03-12 NOTE — Progress Notes (Signed)
BH MD/PA/NP OP Progress Note  03/12/2020 10:15 AM Holly Cortez  MRN:  [id number]  Chief Complaint:  " I am doing fine."  HPI: Patient reported she is doing well.  She stated that the current regimen is helping her mood and sleep.  She informed that her symptoms of depression and hallucinations are well managed.  She denies any significant side effects to her regimen.  She requested to continue same regimen for now.  Visit Diagnosis:    ICD-10-CM   1. Bipolar affective disorder, current episode mixed, current episode severity unspecified (HCC)  F31.60   2. Insomnia, unspecified type  G47.00     Past Psychiatric History: Bipolar disorder, insomnia, anxiety  Past Medical History:  Past Medical History:  Diagnosis Date  . Bipolar disorder (HCC)   . Emphysema lung (HCC)   . Emphysema of lung (HCC)   . Pneumonia     Past Surgical History:  Procedure Laterality Date  . APPENDECTOMY    . FINGER AMPUTATION Left     Family Psychiatric History: denied  Family History:  Family History  Problem Relation Age of Onset  . Heart disease Mother   . Hyperlipidemia Father     Social History:  Social History   Socioeconomic History  . Marital status: Single    Spouse name: Not on file  . Number of children: Not on file  . Years of education: Not on file  . Highest education level: Not on file  Occupational History  . Not on file  Tobacco Use  . Smoking status: Current Every Day Smoker    Packs/day: 0.25    Types: Cigarettes  . Smokeless tobacco: Never Used  Vaping Use  . Vaping Use: Never used  Substance and Sexual Activity  . Alcohol use: No  . Drug use: No  . Sexual activity: Yes    Birth control/protection: Post-menopausal  Other Topics Concern  . Not on file  Social History Narrative  . Not on file   Social Determinants of Health   Financial Resource Strain:   . Difficulty of Paying Living Expenses: Not on file  Food Insecurity:   . Worried About Community education officer in the Last Year: Not on file  . Ran Out of Food in the Last Year: Not on file  Transportation Needs:   . Lack of Transportation (Medical): Not on file  . Lack of Transportation (Non-Medical): Not on file  Physical Activity:   . Days of Exercise per Week: Not on file  . Minutes of Exercise per Session: Not on file  Stress:   . Feeling of Stress : Not on file  Social Connections:   . Frequency of Communication with Friends and Family: Not on file  . Frequency of Social Gatherings with Friends and Family: Not on file  . Attends Religious Services: Not on file  . Active Member of Clubs or Organizations: Not on file  . Attends Banker Meetings: Not on file  . Marital Status: Not on file    Allergies: No Known Allergies  Metabolic Disorder Labs: Lab Results  Component Value Date   HGBA1C 5.3 02/06/2018   MPG 105 02/06/2018   MPG 117 (H) 05/10/2015   No results found for: PROLACTIN No results found for: CHOL, TRIG, HDL, CHOLHDL, VLDL, LDLCALC Lab Results  Component Value Date   TSH 0.274 (L) 05/10/2015   TSH 0.941 10/14/2012    Therapeutic Level Labs: No results found for: LITHIUM No results found  for: VALPROATE No components found for:  CBMZ  Current Medications: Current Outpatient Medications  Medication Sig Dispense Refill  . albuterol (PROVENTIL HFA;VENTOLIN HFA) 108 (90 BASE) MCG/ACT inhaler Inhale 2 puffs into the lungs every 6 (six) hours as needed for wheezing or shortness of breath. 1 Inhaler 2  . albuterol (PROVENTIL) (2.5 MG/3ML) 0.083% nebulizer solution Take 3 mLs (2.5 mg total) by nebulization every 6 (six) hours as needed for wheezing or shortness of breath. 75 mL 12  . aspirin EC 325 MG tablet Take 975 mg by mouth 3 (three) times daily as needed for mild pain or moderate pain.    Marland Kitchen buPROPion (WELLBUTRIN SR) 150 MG 12 hr tablet Take 1 tablet (150 mg total) by mouth 2 (two) times daily. 60 tablet 2  . dextromethorphan (DELSYM) 30 MG/5ML  liquid Take 60 mg by mouth daily as needed for cough.    . divalproex (DEPAKOTE) 500 MG DR tablet Take 1 tablet (500 mg total) by mouth 2 (two) times daily. 60 tablet 2  . gabapentin (NEURONTIN) 400 MG capsule Take 2 capsules (800 mg total) by mouth 3 (three) times daily. 180 capsule 2  . predniSONE (DELTASONE) 10 MG tablet Take 40mg  po daily for 2 days then 30mg  daily for 2 days then 20mg  daily for 2 days then 10mg  daily for 2 days then stop 20 tablet 0  . zolpidem (AMBIEN) 10 MG tablet Take 1 tablet (10 mg total) by mouth at bedtime. 30 tablet 2   No current facility-administered medications for this visit.     Psychiatric Specialty Exam: Review of Systems  Last menstrual period 05/27/2011.There is no height or weight on file to calculate BMI.  General Appearance: Fairly Groomed  Eye Contact:  Good  Speech:  Clear and Coherent and Normal Rate  Volume:  Normal  Mood:  Euthymic  Affect:  Congruent  Thought Process:  Goal Directed and Descriptions of Associations: Intact  Orientation:  Full (Time, Place, and Person)  Thought Content: Logical   Suicidal Thoughts:  No  Homicidal Thoughts:  No  Memory:  Immediate;   Good Recent;   Good  Judgement:  Fair  Insight:  Fair  Psychomotor Activity:  Normal  Concentration:  Concentration: Good and Attention Span: Good  Recall:  Good  Fund of Knowledge: Good  Language: Good  Akathisia:  Negative  Handed:  Right  AIMS (if indicated): not done  Assets:  Communication Skills Desire for Improvement Financial Resources/Insurance Housing  ADL's:  Intact  Cognition: WNL  Sleep:  Good   Screenings: PHQ2-9     Office Visit from 11/26/2016 in [facility] Office Visit from 08/27/2016 in [facility] Office Visit from 02/21/2016 in [facility] Office Visit from 08/12/2015 in [facility] Office Visit from 05/31/2015 in [facility]  PHQ-2 Total Score 0 0 0  0 0       Assessment and Plan: Patient appears to be stable on her current regimen.  1. Bipolar affective disorder, current episode mixed, current episode severity unspecified (HCC)  - buPROPion (WELLBUTRIN SR) 150 MG 12 hr tablet; Take 1 tablet (150 mg total) by mouth 2 (two) times daily.  Dispense: 60 tablet; Refill: 2  2. Insomnia, unspecified type  - zolpidem (AMBIEN) 10 MG tablet; Take 1 tablet (10 mg total) by mouth at bedtime.  Dispense: 30 tablet; Refill: 2  3. GAD (generalized anxiety disorder)  - buPROPion (  WELLBUTRIN SR) 150 MG 12 hr tablet; Take 1 tablet (150 mg total) by mouth 2 (two) times daily.  Dispense: 60 tablet; Refill: 2 - divalproex (DEPAKOTE) 500 MG DR tablet; Take 1 tablet (500 mg total) by mouth 2 (two) times daily.  Dispense: 60 tablet; Refill: 2 - gabapentin (NEURONTIN) 400 MG capsule; Take 2 capsules (800 mg total) by mouth 3 (three) times daily.  Dispense: 180 capsule; Refill: 2  Continue same medication regimen. Follow up in 3 months.   Zena Amos, MD 03/12/2020, 10:15 AM

## 2020-06-03 ENCOUNTER — Other Ambulatory Visit: Payer: Self-pay

## 2020-06-03 ENCOUNTER — Encounter (HOSPITAL_COMMUNITY): Payer: Self-pay | Admitting: Psychiatry

## 2020-06-03 ENCOUNTER — Telehealth (INDEPENDENT_AMBULATORY_CARE_PROVIDER_SITE_OTHER): Payer: No Payment, Other | Admitting: Psychiatry

## 2020-06-03 DIAGNOSIS — F411 Generalized anxiety disorder: Secondary | ICD-10-CM

## 2020-06-03 DIAGNOSIS — F3160 Bipolar disorder, current episode mixed, unspecified: Secondary | ICD-10-CM

## 2020-06-03 DIAGNOSIS — G4700 Insomnia, unspecified: Secondary | ICD-10-CM

## 2020-06-03 MED ORDER — ZOLPIDEM TARTRATE 10 MG PO TABS: 10.0000 mg | ORAL_TABLET | Freq: Every day | ORAL | 2 refills | Status: DC

## 2020-06-03 MED ORDER — GABAPENTIN 400 MG PO CAPS: 800.0000 mg | ORAL_CAPSULE | Freq: Three times a day (TID) | ORAL | 2 refills | Status: DC

## 2020-06-03 MED ORDER — DIVALPROEX SODIUM 500 MG PO DR TAB: 500.0000 mg | DELAYED_RELEASE_TABLET | Freq: Two times a day (BID) | ORAL | 2 refills | Status: DC

## 2020-06-03 MED ORDER — BUPROPION HCL ER (SR) 150 MG PO TB12: 150.0000 mg | ORAL_TABLET | Freq: Two times a day (BID) | ORAL | 2 refills | Status: DC

## 2020-06-03 NOTE — Progress Notes (Signed)
BH MD/PA/NP OP Progress Note  Virtual Visit via Video Note  I connected with Holly Cortez on 06/03/20 at 11:20 AM EST by a video enabled telemedicine application and verified that I am speaking with the correct person using two identifiers.  Location: Patient: Home Provider: Clinic   I discussed the limitations of evaluation and management by telemedicine and the availability of in person appointments. The patient expressed understanding and agreed to proceed.  I provided 15 minutes of non-face-to-face time during this encounter.     06/03/2020 11:21 AM Holly Cortez  MRN:  [id number]  Chief Complaint:  " I am doing well now."  HPI: Patient follows she is doing well currently.  She informed that she had a good Christmas holiday however she was very sick with Covid before the Christmas week.  She stated that she did not get her Covid vaccinations last year however has changed her mind now.  She recently got her first dose of Covid vaccination and is about to get the second dose in a few weeks. She stated that she was very sick and she does not want to go through it again.  She is happy that she did not have to go to the hospital. She said that her current medicine regimen is helpful and she does not want to adjust or change anything at present.  She denied any other concerns at this time.   Visit Diagnosis:    ICD-10-CM   1. Bipolar affective disorder, current episode mixed, current episode severity unspecified (HCC)  F31.60   2. GAD (generalized anxiety disorder)  F41.1   3. Insomnia, unspecified type  G47.00     Past Psychiatric History: Bipolar disorder, insomnia, anxiety  Past Medical History:  Past Medical History:  Diagnosis Date  . Bipolar disorder (HCC)   . Emphysema lung (HCC)   . Emphysema of lung (HCC)   . Pneumonia     Past Surgical History:  Procedure Laterality Date  . APPENDECTOMY    . FINGER AMPUTATION Left     Family Psychiatric History:  denied  Family History:  Family History  Problem Relation Age of Onset  . Heart disease Mother   . Hyperlipidemia Father     Social History:  Social History   Socioeconomic History  . Marital status: Single    Spouse name: Not on file  . Number of children: Not on file  . Years of education: Not on file  . Highest education level: Not on file  Occupational History  . Not on file  Tobacco Use  . Smoking status: Current Every Day Smoker    Packs/day: 0.25    Types: Cigarettes  . Smokeless tobacco: Never Used  Vaping Use  . Vaping Use: Never used  Substance and Sexual Activity  . Alcohol use: No  . Drug use: No  . Sexual activity: Yes    Birth control/protection: Post-menopausal  Other Topics Concern  . Not on file  Social History Narrative  . Not on file   Social Determinants of Health   Financial Resource Strain: Not on file  Food Insecurity: Not on file  Transportation Needs: Not on file  Physical Activity: Not on file  Stress: Not on file  Social Connections: Not on file    Allergies: No Known Allergies  Metabolic Disorder Labs: Lab Results  Component Value Date   HGBA1C 5.3 02/06/2018   MPG 105 02/06/2018   MPG 117 (H) 05/10/2015   No results found for: PROLACTIN No  results found for: CHOL, TRIG, HDL, CHOLHDL, VLDL, LDLCALC Lab Results  Component Value Date   TSH 0.274 (L) 05/10/2015   TSH 0.941 10/14/2012    Therapeutic Level Labs: No results found for: LITHIUM No results found for: VALPROATE No components found for:  CBMZ  Current Medications: Current Outpatient Medications  Medication Sig Dispense Refill  . albuterol (PROVENTIL HFA;VENTOLIN HFA) 108 (90 BASE) MCG/ACT inhaler Inhale 2 puffs into the lungs every 6 (six) hours as needed for wheezing or shortness of breath. 1 Inhaler 2  . albuterol (PROVENTIL) (2.5 MG/3ML) 0.083% nebulizer solution Take 3 mLs (2.5 mg total) by nebulization every 6 (six) hours as needed for wheezing or  shortness of breath. 75 mL 12  . aspirin EC 325 MG tablet Take 975 mg by mouth 3 (three) times daily as needed for mild pain or moderate pain.    Marland Kitchen buPROPion (WELLBUTRIN SR) 150 MG 12 hr tablet Take 1 tablet (150 mg total) by mouth 2 (two) times daily. 60 tablet 2  . dextromethorphan (DELSYM) 30 MG/5ML liquid Take 60 mg by mouth daily as needed for cough.    . divalproex (DEPAKOTE) 500 MG DR tablet Take 1 tablet (500 mg total) by mouth 2 (two) times daily. 60 tablet 2  . gabapentin (NEURONTIN) 400 MG capsule Take 2 capsules (800 mg total) by mouth 3 (three) times daily. 180 capsule 2  . predniSONE (DELTASONE) 10 MG tablet Take 40mg  po daily for 2 days then 30mg  daily for 2 days then 20mg  daily for 2 days then 10mg  daily for 2 days then stop 20 tablet 0  . zolpidem (AMBIEN) 10 MG tablet Take 1 tablet (10 mg total) by mouth at bedtime. 30 tablet 2   No current facility-administered medications for this visit.     Psychiatric Specialty Exam: Review of Systems  Last menstrual period 05/27/2011.There is no height or weight on file to calculate BMI.  General Appearance: Fairly Groomed  Eye Contact:  Good  Speech:  Clear and Coherent and Normal Rate  Volume:  Normal  Mood:  Euthymic  Affect:  Congruent  Thought Process:  Goal Directed and Descriptions of Associations: Intact  Orientation:  Full (Time, Place, and Person)  Thought Content: Logical   Suicidal Thoughts:  No  Homicidal Thoughts:  No  Memory:  Immediate;   Good Recent;   Good  Judgement:  Fair  Insight:  Fair  Psychomotor Activity:  Normal  Concentration:  Concentration: Good and Attention Span: Good  Recall:  Good  Fund of Knowledge: Good  Language: Good  Akathisia:  Negative  Handed:  Right  AIMS (if indicated): not done  Assets:  Communication Skills Desire for Improvement Financial Resources/Insurance Housing  ADL's:  Intact  Cognition: WNL  Sleep:  Good   Screenings: PHQ2-9   Flowsheet Row Office Visit from  11/26/2016 in [facility] Office Visit from 08/27/2016 in [facility] Office Visit from 02/21/2016 in [facility] Office Visit from 08/12/2015 in [facility] Office Visit from 05/31/2015 in [facility]  PHQ-2 Total Score 0 0 0 0 0       Assessment and Plan: Patient appears to be stable on her current regimen.  She denied any issues or concerns at this time.  1. Bipolar affective disorder, current episode mixed, current episode severity unspecified (HCC)  - buPROPion (WELLBUTRIN SR) 150 MG 12 hr tablet; Take 1 tablet (150 mg  total) by mouth 2 (two) times daily.  Dispense: 60 tablet; Refill: 2  2. Insomnia, unspecified type  - zolpidem (AMBIEN) 10 MG tablet; Take 1 tablet (10 mg total) by mouth at bedtime.  Dispense: 30 tablet; Refill: 2  3. GAD (generalized anxiety disorder)  - buPROPion (WELLBUTRIN SR) 150 MG 12 hr tablet; Take 1 tablet (150 mg total) by mouth 2 (two) times daily.  Dispense: 60 tablet; Refill: 2 - divalproex (DEPAKOTE) 500 MG DR tablet; Take 1 tablet (500 mg total) by mouth 2 (two) times daily.  Dispense: 60 tablet; Refill: 2 - gabapentin (NEURONTIN) 400 MG capsule; Take 2 capsules (800 mg total) by mouth 3 (three) times daily.  Dispense: 180 capsule; Refill: 2  Continue same medication regimen. Follow up in 3 months.   Zena Amos, MD 06/03/2020, 11:21 AM

## 2020-06-14 ENCOUNTER — Telehealth (HOSPITAL_COMMUNITY): Payer: Self-pay | Admitting: *Deleted

## 2020-06-14 MED ORDER — BUSPIRONE HCL 15 MG PO TABS: 15.0000 mg | ORAL_TABLET | Freq: Three times a day (TID) | ORAL | 2 refills | Status: DC

## 2020-06-14 NOTE — Telephone Encounter (Signed)
Okay, can see that Holly Cortez had sent Rx for Buspar 15 mg TID in July. Sending to [facility] pharmacy now.

## 2020-06-14 NOTE — Addendum Note (Signed)
Addended by: Zena Amos on: 06/14/2020 10:20 AM   Modules accepted: Orders

## 2020-06-14 NOTE — Telephone Encounter (Signed)
Call from patient who saw Dr Evelene Croon this week and picked her meds up yesterday but didn't have Buspar in her bag. She is calling now to see what happened that it wasn't ordered. Reviewed our record and called [place] pharmacy for the history of her Buspar. She had been getting it when she was a patient at [facility] and it looks like initially here she had seen Ms Durwin Nora NP and she had prescribed it. Harlow Asa said she last filled it on 05/13/20 for 30 days so she did just run out of it. Will ask Dr Evelene Croon to call it in if she feels it is appropriate.

## 2020-07-13 IMAGING — DX DG CHEST 2V
2 series · 2 of 2 positions shown · non-contrast
Comparison: 03/09/2016

CLINICAL DATA: Cough and congestion.  Chest pain for 2 weeks.

EXAM:
CHEST - 2 VIEW

[chest pa]
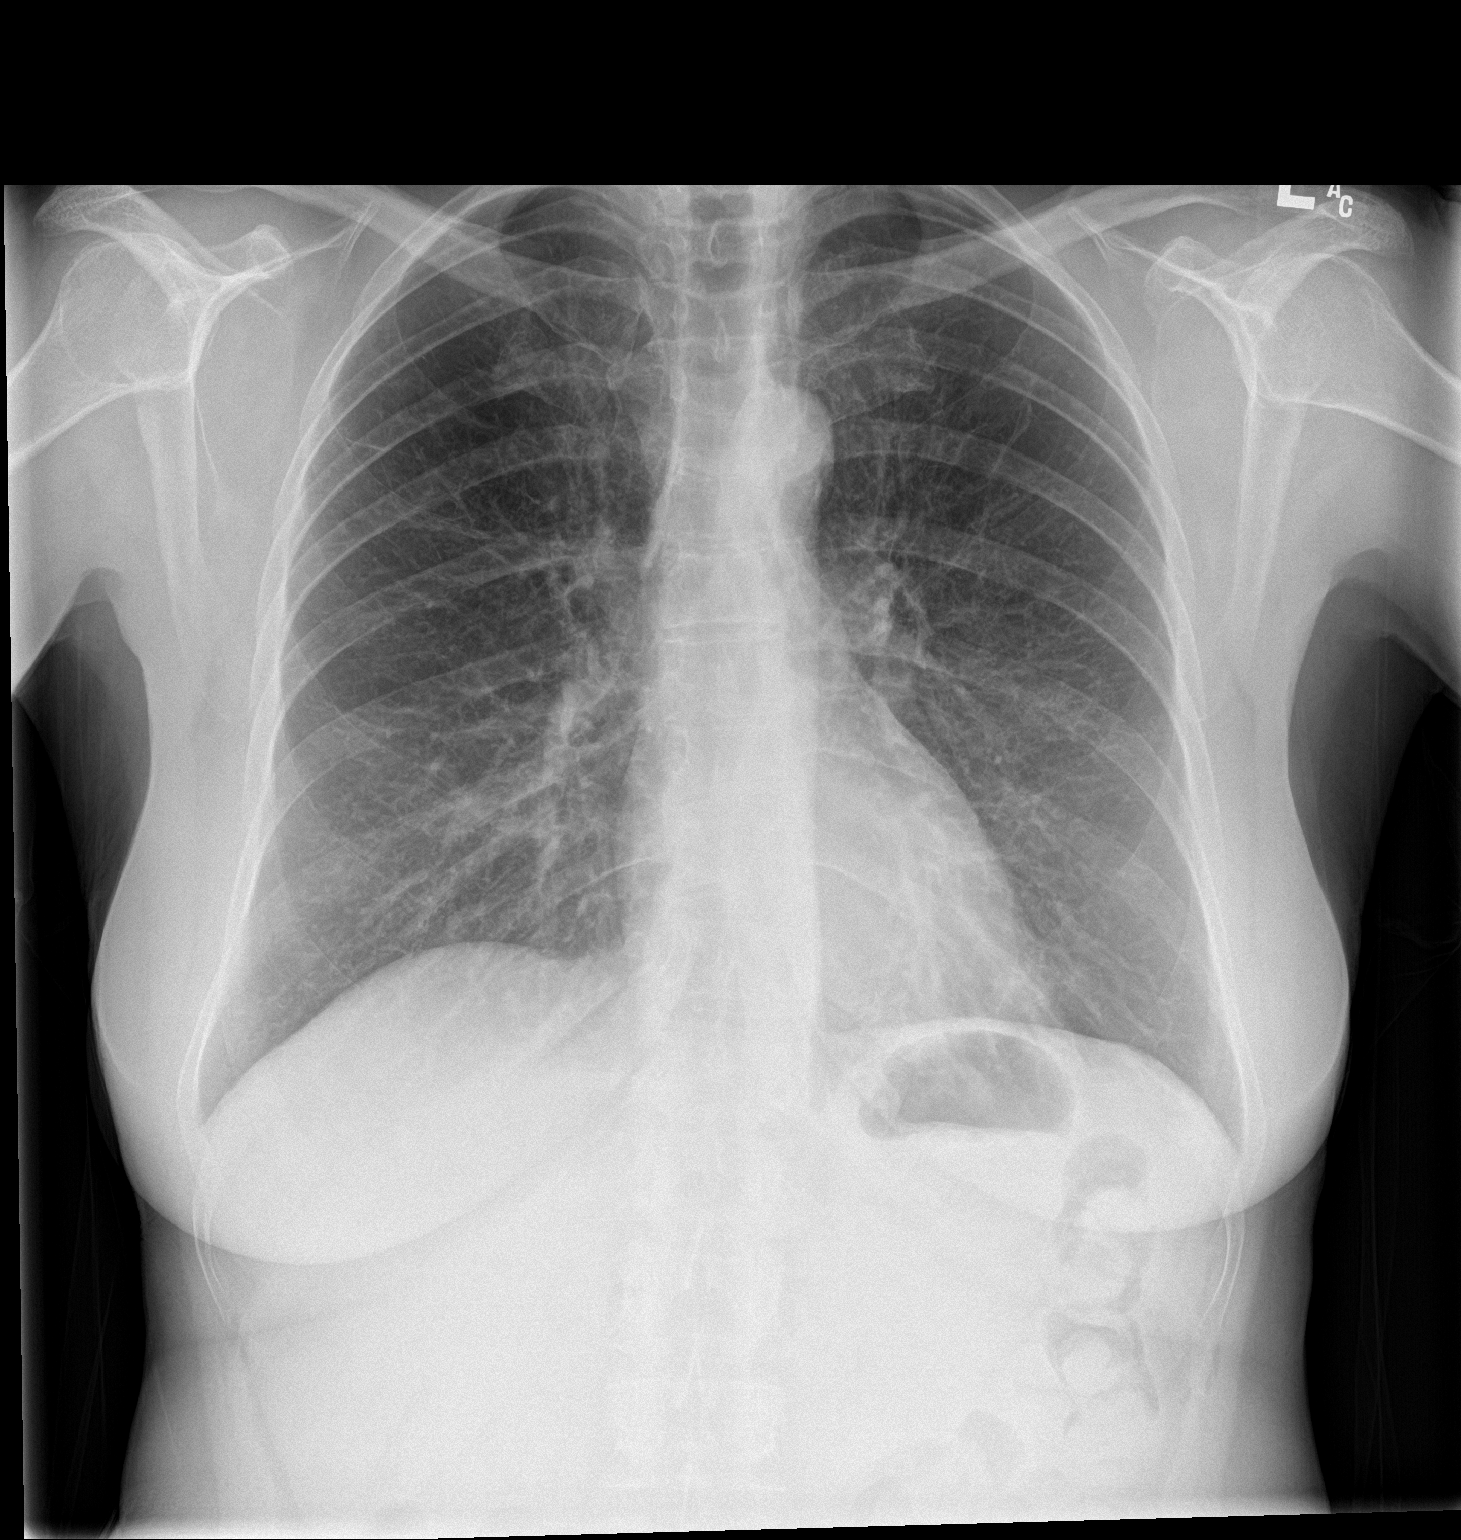

[chest lat]
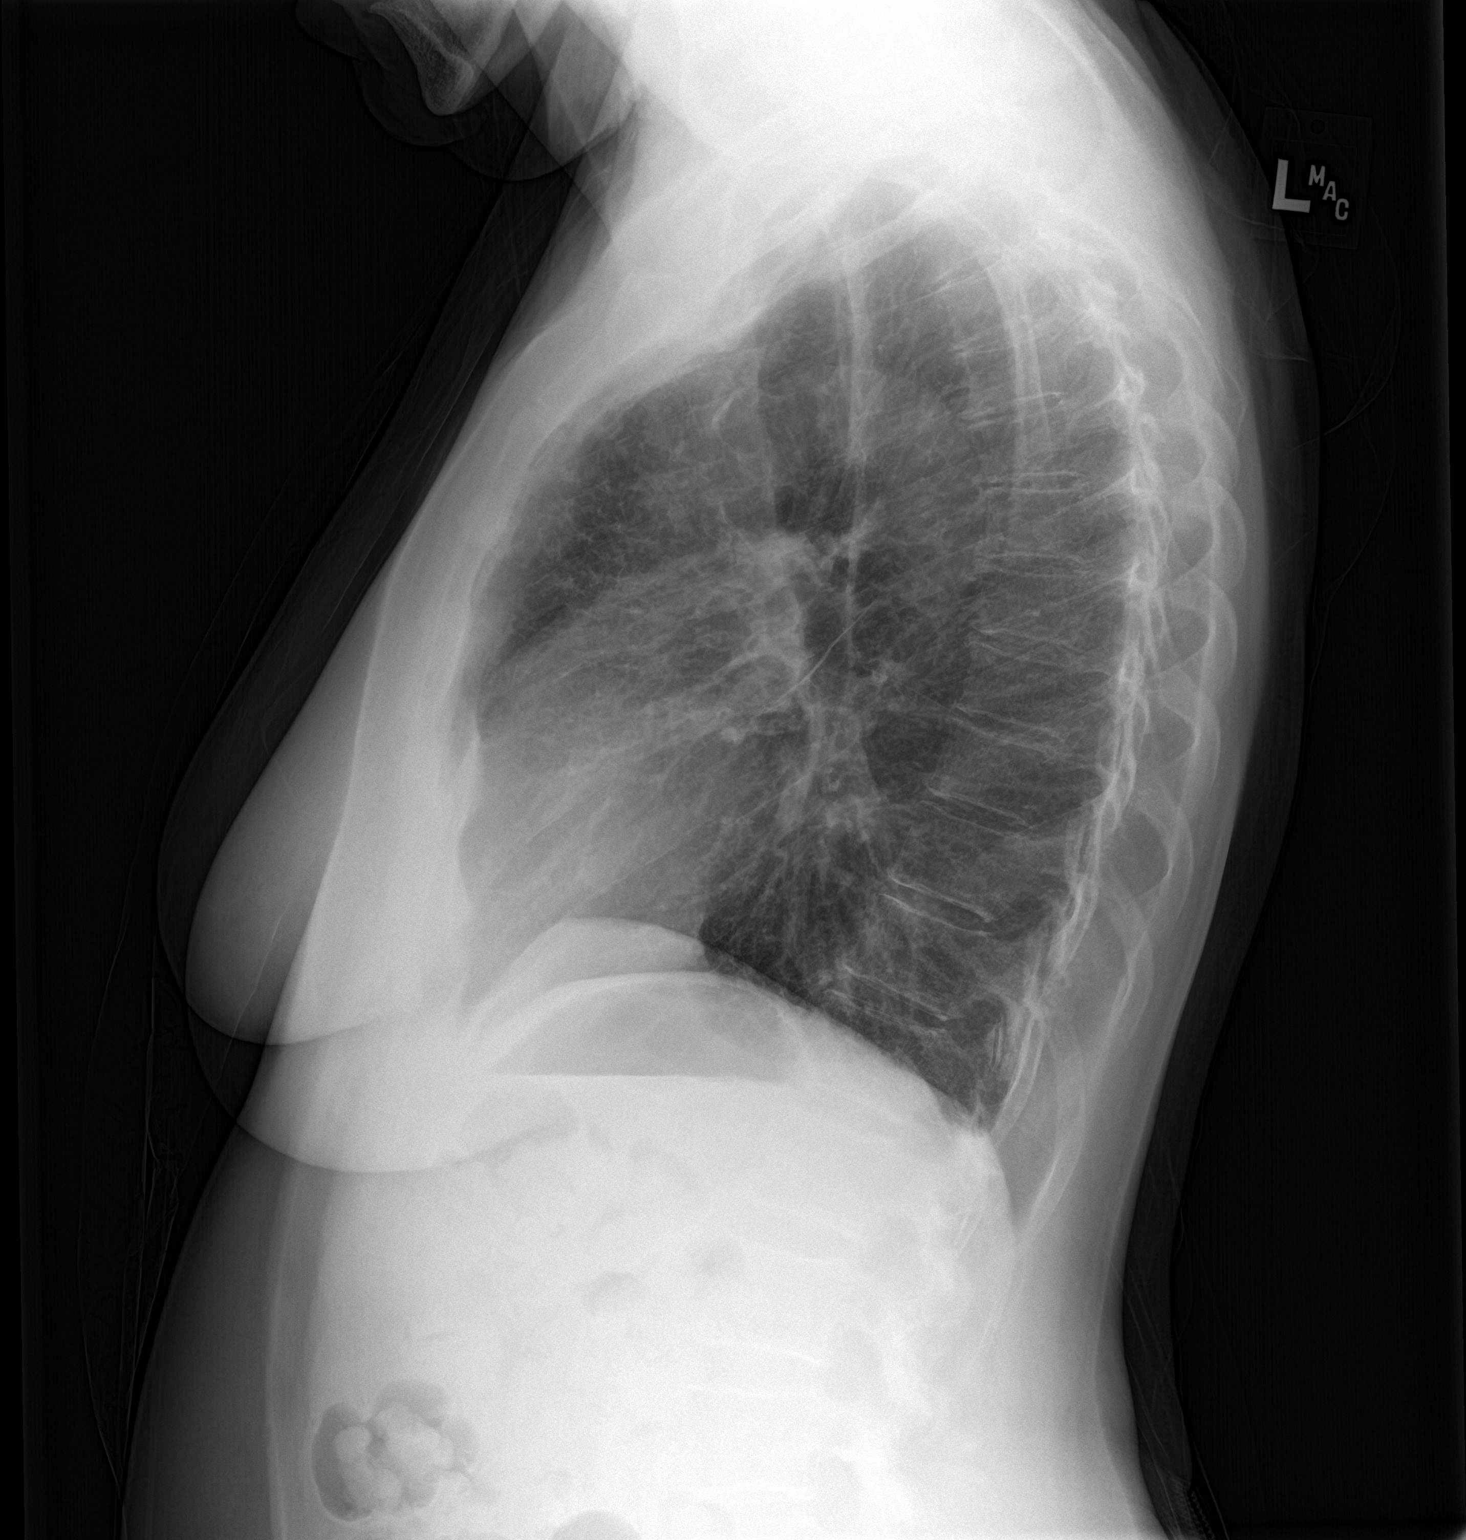

[2 of 2 positions shown; findings below may reference images not displayed]

FINDINGS: The cardiomediastinal contours are normal. Chronic bronchitic
change.. Pulmonary vasculature is normal. No consolidation, pleural
effusion, or pneumothorax. No acute osseous abnormalities are seen.
IMPRESSION: Chronic bronchial thickening may be bronchitis, asthma, or smoking
related lung disease. No acute findings.

## 2020-07-17 IMAGING — DX DG CHEST 2V
2 series · 2 of 2 positions shown · non-contrast
Comparison: 02/01/2018

CLINICAL DATA: Shortness of breath over the last 4 days. History of
pneumonia.

EXAM:
CHEST - 2 VIEW

[chest lat]
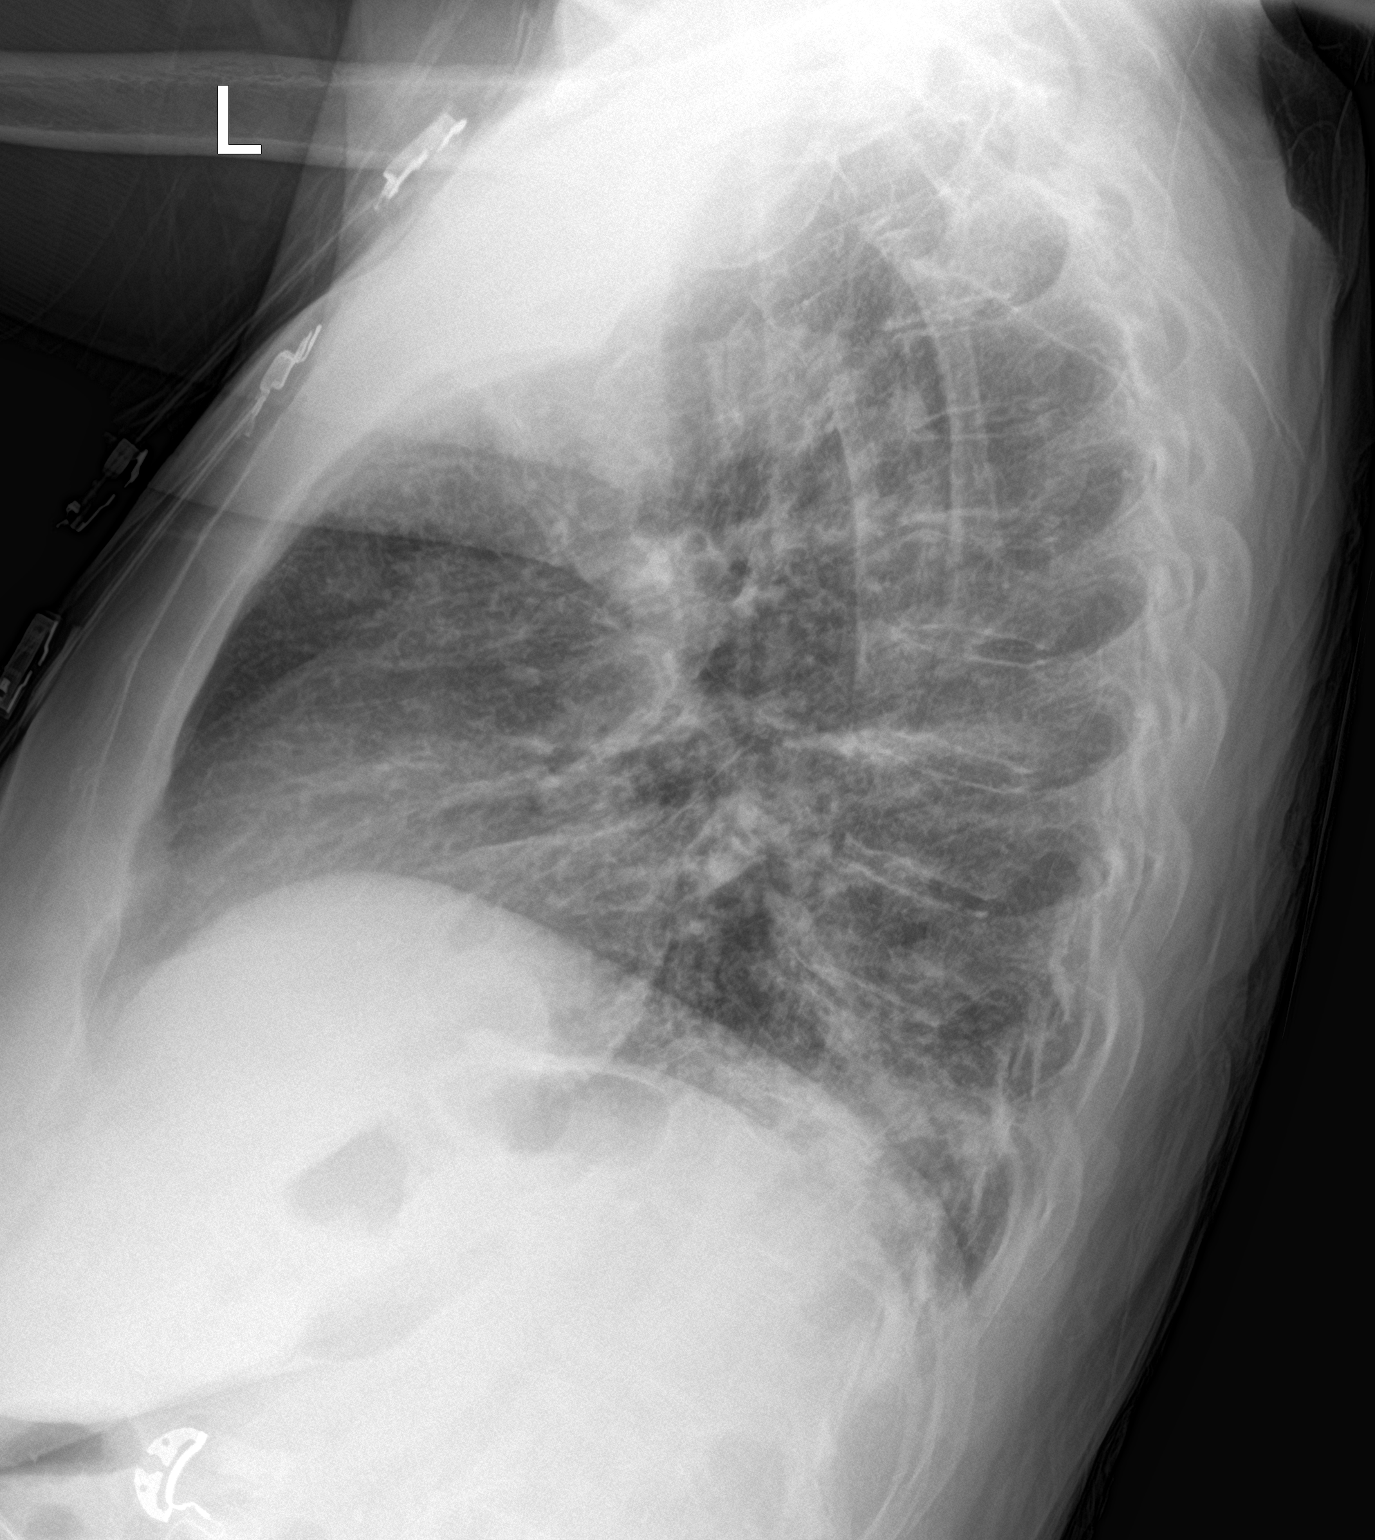

[chest ap]
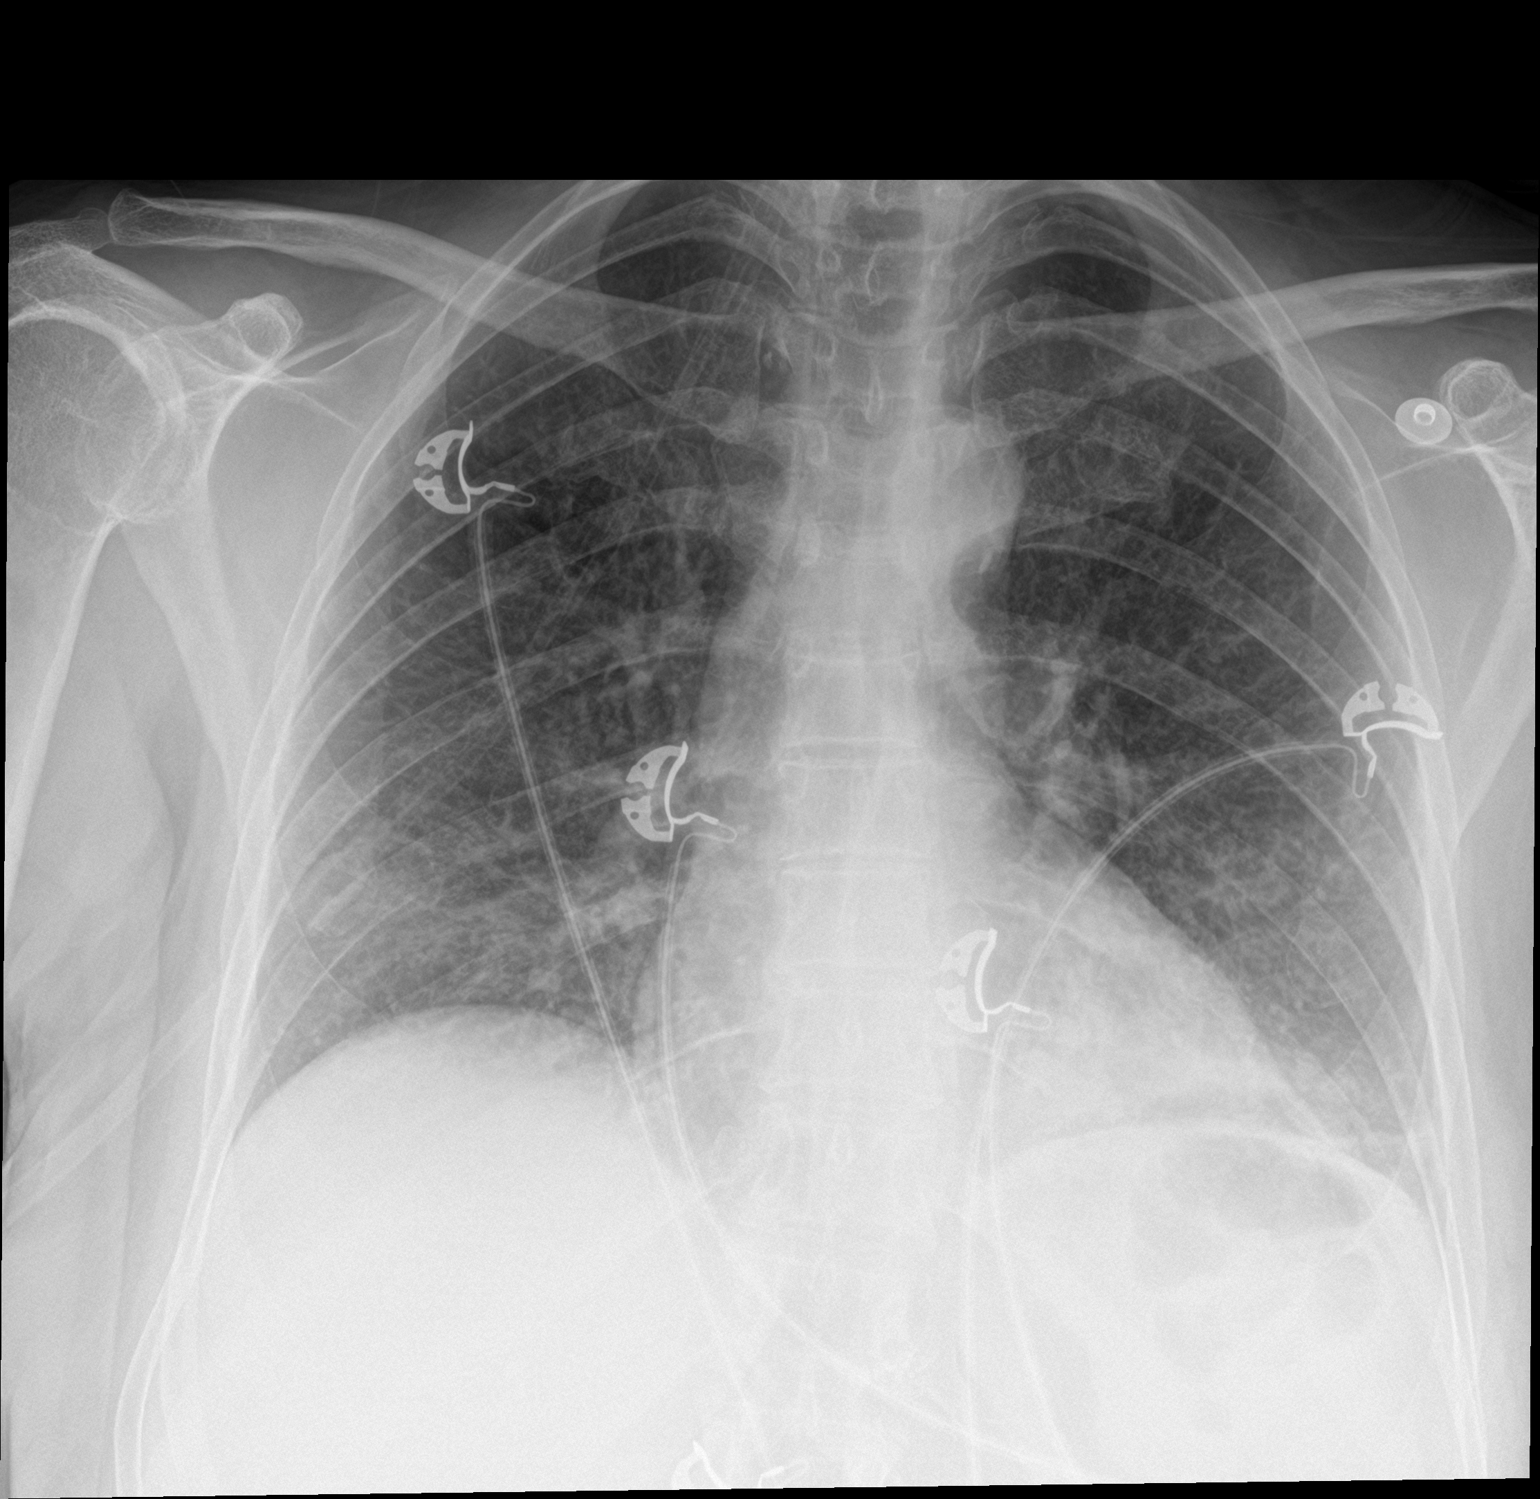

[2 of 2 positions shown; findings below may reference images not displayed]

FINDINGS: Heart size remains normal. Some aortic atherosclerosis. Worsening of
bilateral pulmonary infiltrates, most pronounced in the left lower
lobe. No dense consolidation or lobar collapse however. No
effusions.
IMPRESSION: Worsening of bilateral bronchopneumonia, most pronounced in the left
lower lobe.

## 2020-08-28 ENCOUNTER — Encounter (HOSPITAL_COMMUNITY): Payer: Self-pay | Admitting: Psychiatry

## 2020-08-28 ENCOUNTER — Other Ambulatory Visit: Payer: Self-pay

## 2020-08-28 ENCOUNTER — Telehealth (INDEPENDENT_AMBULATORY_CARE_PROVIDER_SITE_OTHER): Payer: No Payment, Other | Admitting: Psychiatry

## 2020-08-28 DIAGNOSIS — F411 Generalized anxiety disorder: Secondary | ICD-10-CM | POA: Diagnosis not present

## 2020-08-28 DIAGNOSIS — G4700 Insomnia, unspecified: Secondary | ICD-10-CM | POA: Diagnosis not present

## 2020-08-28 DIAGNOSIS — F3160 Bipolar disorder, current episode mixed, unspecified: Secondary | ICD-10-CM | POA: Diagnosis not present

## 2020-08-28 MED ORDER — BUSPIRONE HCL 15 MG PO TABS: 15.0000 mg | ORAL_TABLET | Freq: Three times a day (TID) | ORAL | 2 refills | Status: DC

## 2020-08-28 MED ORDER — GABAPENTIN 400 MG PO CAPS: 800.0000 mg | ORAL_CAPSULE | Freq: Three times a day (TID) | ORAL | 2 refills | Status: DC

## 2020-08-28 MED ORDER — DIVALPROEX SODIUM 500 MG PO DR TAB: 500.0000 mg | DELAYED_RELEASE_TABLET | Freq: Two times a day (BID) | ORAL | 2 refills | Status: DC

## 2020-08-28 MED ORDER — BUPROPION HCL ER (SR) 150 MG PO TB12: 150.0000 mg | ORAL_TABLET | Freq: Two times a day (BID) | ORAL | 2 refills | Status: DC

## 2020-08-28 MED ORDER — ZOLPIDEM TARTRATE 10 MG PO TABS: 10.0000 mg | ORAL_TABLET | Freq: Every day | ORAL | 2 refills | Status: DC

## 2020-08-28 NOTE — Progress Notes (Signed)
BH MD/PA/NP OP Progress Note  Virtual Visit via Telephone Note  I connected with Holly Cortez on 08/28/20 at 10:20 AM EDT by telephone and verified that I am speaking with the correct person using two identifiers.  Location: Patient: home Provider: Clinic   I discussed the limitations, risks, security and privacy concerns of performing an evaluation and management service by telephone and the availability of in person appointments. I also discussed with the patient that there may be a patient responsible charge related to this service. The patient expressed understanding and agreed to proceed.   I provided 15 minutes of non-face-to-face time during this encounter.      08/28/2020 10:29 AM Holly Cortez  MRN:  [id number]  Chief Complaint:  " everything is going well."  HPI: Patient reports she is doing well.  She stated that her medications are working well and she thinks she should just keep taking the same regimen for now.  She informed that her work keeps her busy.  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  She stated that usually she has no difficulty with sleep at night and she sometimes uses Ambien as as needed. She denied any other concerns or issues today.  Visit Diagnosis:    ICD-10-CM   1. Bipolar affective disorder, current episode mixed, current episode severity unspecified (HCC)  F31.60   2. GAD (generalized anxiety disorder)  F41.1   3. Insomnia, unspecified type  G47.00     Past Psychiatric History: Bipolar disorder, insomnia, anxiety  Past Medical History:  Past Medical History:  Diagnosis Date  . Bipolar disorder (HCC)   . Emphysema lung (HCC)   . Emphysema of lung (HCC)   . Pneumonia     Past Surgical History:  Procedure Laterality Date  . APPENDECTOMY    . FINGER AMPUTATION Left     Family Psychiatric History: denied  Family History:  Family History  Problem Relation Age of Onset  . Heart disease Mother   . Hyperlipidemia  Father     Social History:  Social History   Socioeconomic History  . Marital status: Single    Spouse name: Not on file  . Number of children: Not on file  . Years of education: Not on file  . Highest education level: Not on file  Occupational History  . Not on file  Tobacco Use  . Smoking status: Current Every Day Smoker    Packs/day: 0.25    Types: Cigarettes  . Smokeless tobacco: Never Used  Vaping Use  . Vaping Use: Never used  Substance and Sexual Activity  . Alcohol use: No  . Drug use: No  . Sexual activity: Yes    Birth control/protection: Post-menopausal  Other Topics Concern  . Not on file  Social History Narrative  . Not on file   Social Determinants of Health   Financial Resource Strain: Not on file  Food Insecurity: Not on file  Transportation Needs: Not on file  Physical Activity: Not on file  Stress: Not on file  Social Connections: Not on file    Allergies: No Known Allergies  Metabolic Disorder Labs: Lab Results  Component Value Date   HGBA1C 5.3 02/06/2018   MPG 105 02/06/2018   MPG 117 (H) 05/10/2015   No results found for: PROLACTIN No results found for: CHOL, TRIG, HDL, CHOLHDL, VLDL, LDLCALC Lab Results  Component Value Date   TSH 0.274 (L) 05/10/2015   TSH 0.941 10/14/2012    Therapeutic  Level Labs: No results found for: LITHIUM No results found for: VALPROATE No components found for:  CBMZ  Current Medications: Current Outpatient Medications  Medication Sig Dispense Refill  . albuterol (PROVENTIL HFA;VENTOLIN HFA) 108 (90 BASE) MCG/ACT inhaler Inhale 2 puffs into the lungs every 6 (six) hours as needed for wheezing or shortness of breath. 1 Inhaler 2  . albuterol (PROVENTIL) (2.5 MG/3ML) 0.083% nebulizer solution Take 3 mLs (2.5 mg total) by nebulization every 6 (six) hours as needed for wheezing or shortness of breath. 75 mL 12  . aspirin EC 325 MG tablet Take 975 mg by mouth 3 (three) times daily as needed for mild pain or  moderate pain.    Marland Kitchen buPROPion (WELLBUTRIN SR) 150 MG 12 hr tablet Take 1 tablet (150 mg total) by mouth 2 (two) times daily. 60 tablet 2  . busPIRone (BUSPAR) 15 MG tablet Take 1 tablet (15 mg total) by mouth 3 (three) times daily. 90 tablet 2  . divalproex (DEPAKOTE) 500 MG DR tablet Take 1 tablet (500 mg total) by mouth 2 (two) times daily. 60 tablet 2  . gabapentin (NEURONTIN) 400 MG capsule Take 2 capsules (800 mg total) by mouth 3 (three) times daily. 180 capsule 2  . predniSONE (DELTASONE) 10 MG tablet Take 40mg  po daily for 2 days then 30mg  daily for 2 days then 20mg  daily for 2 days then 10mg  daily for 2 days then stop 20 tablet 0  . zolpidem (AMBIEN) 10 MG tablet Take 1 tablet (10 mg total) by mouth at bedtime. 30 tablet 2   No current facility-administered medications for this visit.     Psychiatric Specialty Exam: Review of Systems  Last menstrual period 05/27/2011.There is no height or weight on file to calculate BMI.  General Appearance: unable to assess due to phone visit  Eye Contact:  Good  Speech:  Clear and Coherent and Normal Rate  Volume:  Normal  Mood:  Euthymic  Affect:  Congruent  Thought Process:  Goal Directed and Descriptions of Associations: Intact  Orientation:  Full (Time, Place, and Person)  Thought Content: Logical   Suicidal Thoughts:  No  Homicidal Thoughts:  No  Memory:  Immediate;   Good Recent;   Good  Judgement:  Fair  Insight:  Fair  Psychomotor Activity:  Normal  Concentration:  Concentration: Good and Attention Span: Good  Recall:  Good  Fund of Knowledge: Good  Language: Good  Akathisia:  Negative  Handed:  Right  AIMS (if indicated): not done  Assets:  Communication Skills Desire for Improvement Financial Resources/Insurance Housing  ADL's:  Intact  Cognition: WNL  Sleep:  Good   Screenings: PHQ2-9   Flowsheet Row Office Visit from 11/26/2016 in [facility] Office Visit from 08/27/2016 in [facility] Office Visit from 02/21/2016 in [facility] Office Visit from 08/12/2015 in [facility] Office Visit from 05/31/2015 in [facility]  PHQ-2 Total Score 0 0 0 0 0       Assessment and Plan: Patient appears to be stable on her current regimen.  1. Bipolar affective disorder, current episode mixed, current episode severity unspecified (HCC)  - buPROPion (WELLBUTRIN SR) 150 MG 12 hr tablet; Take 1 tablet (150 mg total) by mouth 2 (two) times daily.  Dispense: 60 tablet; Refill: 2  2. GAD (generalized anxiety disorder)  - buPROPion (WELLBUTRIN SR) 150 MG 12 hr tablet; Take 1 tablet (  150 mg total) by mouth 2 (two) times daily.  Dispense: 60 tablet; Refill: 2 - busPIRone (BUSPAR) 15 MG tablet; Take 1 tablet (15 mg total) by mouth 3 (three) times daily.  Dispense: 90 tablet; Refill: 2 - divalproex (DEPAKOTE) 500 MG DR tablet; Take 1 tablet (500 mg total) by mouth 2 (two) times daily.  Dispense: 60 tablet; Refill: 2 - gabapentin (NEURONTIN) 400 MG capsule; Take 2 capsules (800 mg total) by mouth 3 (three) times daily.  Dispense: 180 capsule; Refill: 2  3. Insomnia, unspecified type  - zolpidem (AMBIEN) 10 MG tablet; Take 1 tablet (10 mg total) by mouth at bedtime.  Dispense: 30 tablet; Refill: 2   Continue same medication regimen. Follow up in 3 months.   Zena Amos, MD 08/28/2020, 10:29 AM

## 2020-11-26 ENCOUNTER — Other Ambulatory Visit: Payer: Self-pay

## 2020-11-26 ENCOUNTER — Encounter (HOSPITAL_COMMUNITY): Payer: Self-pay | Admitting: Physician Assistant

## 2020-11-26 ENCOUNTER — Telehealth (INDEPENDENT_AMBULATORY_CARE_PROVIDER_SITE_OTHER): Payer: No Payment, Other | Admitting: Physician Assistant

## 2020-11-26 DIAGNOSIS — G4700 Insomnia, unspecified: Secondary | ICD-10-CM | POA: Diagnosis not present

## 2020-11-26 DIAGNOSIS — F3160 Bipolar disorder, current episode mixed, unspecified: Secondary | ICD-10-CM | POA: Diagnosis not present

## 2020-11-26 DIAGNOSIS — F411 Generalized anxiety disorder: Secondary | ICD-10-CM

## 2020-11-26 MED ORDER — BUPROPION HCL ER (SR) 150 MG PO TB12: 150.0000 mg | ORAL_TABLET | Freq: Two times a day (BID) | ORAL | 2 refills | Status: DC

## 2020-11-26 MED ORDER — BUSPIRONE HCL 15 MG PO TABS: 15.0000 mg | ORAL_TABLET | Freq: Three times a day (TID) | ORAL | 2 refills | Status: DC

## 2020-11-26 MED ORDER — DIVALPROEX SODIUM 500 MG PO DR TAB: 500.0000 mg | DELAYED_RELEASE_TABLET | Freq: Two times a day (BID) | ORAL | 2 refills | Status: DC

## 2020-11-26 MED ORDER — GABAPENTIN 400 MG PO CAPS: 800.0000 mg | ORAL_CAPSULE | Freq: Three times a day (TID) | ORAL | 2 refills | Status: DC

## 2020-11-26 NOTE — Progress Notes (Signed)
BH MD/PA/NP OP Progress Note  Virtual Visit via Telephone Note  I connected with Holly Cortez on 11/27/20 at 11:30 AM EDT by telephone and verified that I am speaking with the correct person using two identifiers.  Location: Patient: Home Provider: Clinic   I discussed the limitations, risks, security and privacy concerns of performing an evaluation and management service by telephone and the availability of in person appointments. I also discussed with the patient that there may be a patient responsible charge related to this service. The patient expressed understanding and agreed to proceed.  Follow Up Instructions:  I discussed the assessment and treatment plan with the patient. The patient was provided an opportunity to ask questions and all were answered. The patient agreed with the plan and demonstrated an understanding of the instructions.   The patient was advised to call back or seek an in-person evaluation if the symptoms worsen or if the condition fails to improve as anticipated.  I provided 20 minutes of non-face-to-face time during this encounter.  Meta HatchetUchenna E Phillip Maffei, PA    11/27/2020 11:43 PM Holly Cortez  MRN:  [id number]  Chief Complaint: Follow up and medication management  HPI:   Holly Cortez is a 60 year old female with a past psychiatric history significant for insomnia, generalized anxiety disorder, and bipolar disorder [facility] via virtual telephone visit for follow-up and medication management.  Patient is currently being managed on the following medications:  Divalproex (Depakote) 500 mg DR 2 times daily Zolpidem (Ambien) 10 mg at bedtime Buspirone 15 mg 3 times daily Bupropion (Wellbutrin SR) 150 mg 12-hour tablet daily Gabapentin 400 mg (2 tablets) 3 times daily  Patient reports no issues or concerns regarding her current medication regimen.  Patient denies the need for dosage adjustments  at this time and is requesting refills on all her medications following the conclusion of the encounter.  Patient reports that her medications appear to be working well.  Patient denies depressive episode but states that she is currently going through a heart break.  Patient denies anxiety going on to say that her anxiety has lessened some.  Despite going through a heartbreak, patient appears to be doing extremely well.  A PHQ-9 screen was performed with the patient scoring an 11.  A GAD-7 screen was also performed with the patient scoring a 10.  Patient is pleasant, calm, cooperative, and fully engaged in conversation during the encounter.  Patient states that she is trying not to be sad regarding her recent heartbreak but states that she is very sad  due to the fact.  Patient denies suicidal or homicidal ideations.  She further denies auditory or visual hallucinations and does not appear to be responding to internal/external stimuli.  Patient endorses fair sleep and receives on average 3 to 4 hours of sleep each night.  On a good night, patient states he may receive up to 5-1/2 hours of sleep.  Patient endorses good appetite and eats on average 3 meals per day.  Patient denies alcohol consumption, tobacco use, and illicit drug use.  Visit Diagnosis:    ICD-10-CM   1. Insomnia, unspecified type  G47.00 zolpidem (AMBIEN) 10 MG tablet    2. GAD (generalized anxiety disorder)  F41.1 busPIRone (BUSPAR) 15 MG tablet    buPROPion (WELLBUTRIN SR) 150 MG 12 hr tablet    divalproex (DEPAKOTE) 500 MG DR tablet    gabapentin (NEURONTIN) 400 MG capsule    3. Bipolar affective disorder,  current episode mixed, current episode severity unspecified (HCC)  F31.60 buPROPion (WELLBUTRIN SR) 150 MG 12 hr tablet      Past Psychiatric History:  Bipolar disorder Insomnia Generalized anxiety disorder  Past Medical History:  Past Medical History:  Diagnosis Date   Bipolar disorder (HCC)    Emphysema lung (HCC)     Emphysema of lung (HCC)    Pneumonia     Past Surgical History:  Procedure Laterality Date   APPENDECTOMY     FINGER AMPUTATION Left     Family Psychiatric History:  Patient denied  Family History:  Family History  Problem Relation Age of Onset   Heart disease Mother    Hyperlipidemia Father     Social History:  Social History   Socioeconomic History   Marital status: Single    Spouse name: Not on file   Number of children: Not on file   Years of education: Not on file   Highest education level: Not on file  Occupational History   Not on file  Tobacco Use   Smoking status: Every Day    Packs/day: 0.25    Pack years: 0.00    Types: Cigarettes   Smokeless tobacco: Never  Vaping Use   Vaping Use: Never used  Substance and Sexual Activity   Alcohol use: No   Drug use: No   Sexual activity: Yes    Birth control/protection: Post-menopausal  Other Topics Concern   Not on file  Social History Narrative   Not on file   Social Determinants of Health   Financial Resource Strain: Not on file  Food Insecurity: Not on file  Transportation Needs: Not on file  Physical Activity: Not on file  Stress: Not on file  Social Connections: Not on file    Allergies: No Known Allergies  Metabolic Disorder Labs: Lab Results  Component Value Date   HGBA1C 5.3 02/06/2018   MPG 105 02/06/2018   MPG 117 (H) 05/10/2015   No results found for: PROLACTIN No results found for: CHOL, TRIG, HDL, CHOLHDL, VLDL, LDLCALC Lab Results  Component Value Date   TSH 0.274 (L) 05/10/2015   TSH 0.941 10/14/2012    Therapeutic Level Labs: No results found for: LITHIUM No results found for: VALPROATE No components found for:  CBMZ  Current Medications: Current Outpatient Medications  Medication Sig Dispense Refill   albuterol (PROVENTIL HFA;VENTOLIN HFA) 108 (90 BASE) MCG/ACT inhaler Inhale 2 puffs into the lungs every 6 (six) hours as needed for wheezing or shortness of breath. 1  Inhaler 2   albuterol (PROVENTIL) (2.5 MG/3ML) 0.083% nebulizer solution Take 3 mLs (2.5 mg total) by nebulization every 6 (six) hours as needed for wheezing or shortness of breath. 75 mL 12   aspirin EC 325 MG tablet Take 975 mg by mouth 3 (three) times daily as needed for mild pain or moderate pain.     buPROPion (WELLBUTRIN SR) 150 MG 12 hr tablet Take 1 tablet (150 mg total) by mouth 2 (two) times daily. 60 tablet 2   busPIRone (BUSPAR) 15 MG tablet Take 1 tablet (15 mg total) by mouth 3 (three) times daily. 90 tablet 2   divalproex (DEPAKOTE) 500 MG DR tablet Take 1 tablet (500 mg total) by mouth 2 (two) times daily. 60 tablet 2   gabapentin (NEURONTIN) 400 MG capsule Take 2 capsules (800 mg total) by mouth 3 (three) times daily. 180 capsule 2   predniSONE (DELTASONE) 10 MG tablet Take 40mg  po daily for 2  days then 30mg  daily for 2 days then 20mg  daily for 2 days then 10mg  daily for 2 days then stop 20 tablet 0   zolpidem (AMBIEN) 10 MG tablet Take 1 tablet (10 mg total) by mouth at bedtime. 30 tablet 2   No current facility-administered medications for this visit.     Musculoskeletal: Strength & Muscle Tone: Unable to assess due to telemedicine visit Gait & Station: Unable to assess due to telemedicine visit Patient leans: Unable to assess due to telemedicine visit  Psychiatric Specialty Exam: Review of Systems  Psychiatric/Behavioral:  Positive for sleep disturbance. Negative for decreased concentration, dysphoric mood, hallucinations, self-injury and suicidal ideas. The patient is nervous/anxious. The patient is not hyperactive.    Last menstrual period 05/27/2011.There is no height or weight on file to calculate BMI.  General Appearance: Unable to assess due to telemedicine visit  Eye Contact:  Unable to assess due to telemedicine visit  Speech:  Clear and Coherent and Normal Rate  Volume:  Normal  Mood:  Anxious and Depressed  Affect:  Congruent and Depressed  Thought Process:   Coherent and Descriptions of Associations: Intact  Orientation:  Full (Time, Place, and Person)  Thought Content: WDL   Suicidal Thoughts:  No  Homicidal Thoughts:  No  Memory:  Immediate;   Good Recent;   Good Remote;   Good  Judgement:  Fair  Insight:  Fair  Psychomotor Activity:  Normal  Concentration:  Concentration: Good and Attention Span: Good  Recall:  Good  Fund of Knowledge: Good  Language: Good  Akathisia:  Negative  Handed:  Right  AIMS (if indicated): not done  Assets:  Communication Skills Desire for Improvement Financial Resources/Insurance Housing  ADL's:  Intact  Cognition: WNL  Sleep:  Fair   Screenings: GAD-7    Flowsheet Row Video Visit from 11/26/2020 in [facility]  Total GAD-7 Score 10      PHQ2-9    Flowsheet Row Video Visit from 11/26/2020 in [facility] Office Visit from 11/26/2016 in [facility] Office Visit from 08/27/2016 in [facility] Office Visit from 02/21/2016 in [facility] Office Visit from 08/12/2015 in [facility]  PHQ-2 Total Score 2 0 0 0 0  PHQ-9 Total Score 11 -- -- -- --      Flowsheet Row Video Visit from 11/26/2020 in [facility]  C-SSRS RISK CATEGORY No Risk        Assessment and Plan:   Brylee Berk is a 60 year old female with a past psychiatric history significant for insomnia, generalized anxiety disorder, and bipolar disorder who presents to [facility] via virtual telephone visit for follow-up and medication management.  Patient reports no issues or concerns regarding her current medication regimen.  Patient denies the need for dosage adjustments at this time and is requesting refills on all her medications following the conclusion of the encounter.  Patient's medications to be e- prescribed to pharmacy of choice.  1.  Insomnia, unspecified type  - zolpidem (AMBIEN) 10 MG tablet; Take 1 tablet (10 mg total) by mouth at bedtime.  Dispense: 30 tablet; Refill: 2  2. GAD (generalized anxiety disorder)  - busPIRone (BUSPAR) 15 MG tablet; Take 1 tablet (15 mg total) by mouth 3 (three) times daily.  Dispense: 90 tablet; Refill: 2 - buPROPion (WELLBUTRIN SR) 150 MG 12 hr tablet; Take 1 tablet (  150 mg total) by mouth 2 (two) times daily.  Dispense: 60 tablet; Refill: 2 - divalproex (DEPAKOTE) 500 MG DR tablet; Take 1 tablet (500 mg total) by mouth 2 (two) times daily.  Dispense: 60 tablet; Refill: 2 - gabapentin (NEURONTIN) 400 MG capsule; Take 2 capsules (800 mg total) by mouth 3 (three) times daily.  Dispense: 180 capsule; Refill: 2  3. Bipolar affective disorder, current episode mixed, current episode severity unspecified (HCC)  - buPROPion (WELLBUTRIN SR) 150 MG 12 hr tablet; Take 1 tablet (150 mg total) by mouth 2 (two) times daily.  Dispense: 60 tablet; Refill: 2  Patient to follow up in 3 months Provider spent a total of 20 minutes with the patient/reviewing patient's chart  Meta Hatchet, PA 11/27/2020, 11:43 PM

## 2020-11-27 MED ORDER — ZOLPIDEM TARTRATE 10 MG PO TABS: 10.0000 mg | ORAL_TABLET | Freq: Every day | ORAL | 2 refills | Status: DC

## 2021-02-25 ENCOUNTER — Telehealth (INDEPENDENT_AMBULATORY_CARE_PROVIDER_SITE_OTHER): Payer: No Payment, Other | Admitting: Physician Assistant

## 2021-02-25 ENCOUNTER — Encounter (HOSPITAL_COMMUNITY): Payer: Self-pay | Admitting: Physician Assistant

## 2021-02-25 DIAGNOSIS — F3160 Bipolar disorder, current episode mixed, unspecified: Secondary | ICD-10-CM

## 2021-02-25 DIAGNOSIS — F411 Generalized anxiety disorder: Secondary | ICD-10-CM

## 2021-02-25 DIAGNOSIS — G4700 Insomnia, unspecified: Secondary | ICD-10-CM

## 2021-02-25 MED ORDER — DIVALPROEX SODIUM 500 MG PO DR TAB: 500.0000 mg | DELAYED_RELEASE_TABLET | Freq: Two times a day (BID) | ORAL | 2 refills | Status: DC

## 2021-02-25 MED ORDER — ZOLPIDEM TARTRATE 10 MG PO TABS: 10.0000 mg | ORAL_TABLET | Freq: Every day | ORAL | 2 refills | Status: DC

## 2021-02-25 MED ORDER — BUPROPION HCL ER (SR) 150 MG PO TB12: 150.0000 mg | ORAL_TABLET | Freq: Two times a day (BID) | ORAL | 2 refills | Status: DC

## 2021-02-25 MED ORDER — GABAPENTIN 400 MG PO CAPS: 800.0000 mg | ORAL_CAPSULE | Freq: Three times a day (TID) | ORAL | 2 refills | Status: DC

## 2021-02-25 MED ORDER — BUSPIRONE HCL 15 MG PO TABS: 15.0000 mg | ORAL_TABLET | Freq: Three times a day (TID) | ORAL | 2 refills | Status: DC

## 2021-02-25 NOTE — Progress Notes (Signed)
BH MD/PA/NP OP Progress Note  Virtual Visit via Telephone Note  I connected with Holly Cortez on 02/25/21 at 11:30 AM EDT by telephone and verified that I am speaking with the correct person using two identifiers.  Location: Patient: Home Provider: Clinic   I discussed the limitations, risks, security and privacy concerns of performing an evaluation and management service by telephone and the availability of in person appointments. I also discussed with the patient that there may be a patient responsible charge related to this service. The patient expressed understanding and agreed to proceed.  Follow Up Instructions:  I discussed the assessment and treatment plan with the patient. The patient was provided an opportunity to ask questions and all were answered. The patient agreed with the plan and demonstrated an understanding of the instructions.   The patient was advised to call back or seek an in-person evaluation if the symptoms worsen or if the condition fails to improve as anticipated.  I provided 12 minutes of non-face-to-face time during this encounter.  Meta Hatchet, PA   02/25/2021 3:03 PM Holly Cortez  MRN:  [id number]  Chief Complaint: Follow up and medication management  HPI:   Holly Cortez is a 60 year old female with a past psychiatric history significant for insomnia, generalized anxiety disorder, and bipolar disorder who presents to [facility] via virtual telephone visit for follow-up and medication management.  Patient is currently being managed on the following medications:  Divalproex (Depakote) 500 mg DR 2 times daily Zolpidem (Ambien) 10 mg at bedtime Buspirone 15 mg 3 times daily Bupropion (Wellbutrin SR) 150 mg 12-hour tablet daily Gabapentin 400 mg (2 tablets) 3 times daily  Patient reports no issues or concerns regarding her current medication regimen.  Patient denies a need for dosage adjustments at  this time and is requesting refills on all her medications following the conclusion of the encounter.  Patient reports that her heart rate regarding her previous break-up has gotten a little better.  She continues to her surround herself with people that love her and states that her mother is a very important individual in her life that she continues to get along with and considers her best friend.  In regards to her anxiety, patient reports that it is a hit or miss and that it often comes and goes.  She rates her current anxiety a 5 out of 10 but states that it has been fairly manageable.  Patient denies experiencing any depressive episodes.  Patient denies any new stressors at this time.  A GAD-7 screen was performed with the patient scoring a 3.  Patient is alert and oriented x4, pleasant, calm, cooperative, and fully engaged in conversation during the encounter.  Patient states that she is mildly anxious right now.  Patient denies suicidal or homicidal ideations.  She further denies auditory or visual hallucinations and does not appear to be responding to internal/external stimuli.  Patient endorses fair sleep and receives on average 4 to 5 hours of sleep each night.  Patient endorses good appetite and eats on average 2 meals per day.  Patient denies alcohol consumption and illicit drug use.  Patient endorses tobacco use and smokes on average 5 cigarettes/day.   Visit Diagnosis:    ICD-10-CM   1. GAD (generalized anxiety disorder)  F41.1 gabapentin (NEURONTIN) 400 MG capsule    divalproex (DEPAKOTE) 500 MG DR tablet    buPROPion (WELLBUTRIN SR) 150 MG 12 hr tablet    busPIRone (BUSPAR) 15  MG tablet    2. Bipolar affective disorder, current episode mixed, current episode severity unspecified (HCC)  F31.60 buPROPion (WELLBUTRIN SR) 150 MG 12 hr tablet    3. Insomnia, unspecified type  G47.00 zolpidem (AMBIEN) 10 MG tablet      Past Psychiatric History:  Bipolar disorder Insomnia Generalized  anxiety disorder  Past Medical History:  Past Medical History:  Diagnosis Date   Bipolar disorder (HCC)    Emphysema lung (HCC)    Emphysema of lung (HCC)    Pneumonia     Past Surgical History:  Procedure Laterality Date   APPENDECTOMY     FINGER AMPUTATION Left     Family Psychiatric History:  Patient denied  Family History:  Family History  Problem Relation Age of Onset   Heart disease Mother    Hyperlipidemia Father     Social History:  Social History   Socioeconomic History   Marital status: Single    Spouse name: Not on file   Number of children: Not on file   Years of education: Not on file   Highest education level: Not on file  Occupational History   Not on file  Tobacco Use   Smoking status: Every Day    Packs/day: 0.25    Types: Cigarettes   Smokeless tobacco: Never  Vaping Use   Vaping Use: Never used  Substance and Sexual Activity   Alcohol use: No   Drug use: No   Sexual activity: Yes    Birth control/protection: Post-menopausal  Other Topics Concern   Not on file  Social History Narrative   Not on file   Social Determinants of Health   Financial Resource Strain: Not on file  Food Insecurity: Not on file  Transportation Needs: Not on file  Physical Activity: Not on file  Stress: Not on file  Social Connections: Not on file    Allergies: No Known Allergies  Metabolic Disorder Labs: Lab Results  Component Value Date   HGBA1C 5.3 02/06/2018   MPG 105 02/06/2018   MPG 117 (H) 05/10/2015   No results found for: PROLACTIN No results found for: CHOL, TRIG, HDL, CHOLHDL, VLDL, LDLCALC Lab Results  Component Value Date   TSH 0.274 (L) 05/10/2015   TSH 0.941 10/14/2012    Therapeutic Level Labs: No results found for: LITHIUM No results found for: VALPROATE No components found for:  CBMZ  Current Medications: Current Outpatient Medications  Medication Sig Dispense Refill   albuterol (PROVENTIL HFA;VENTOLIN HFA) 108 (90 BASE)  MCG/ACT inhaler Inhale 2 puffs into the lungs every 6 (six) hours as needed for wheezing or shortness of breath. 1 Inhaler 2   albuterol (PROVENTIL) (2.5 MG/3ML) 0.083% nebulizer solution Take 3 mLs (2.5 mg total) by nebulization every 6 (six) hours as needed for wheezing or shortness of breath. 75 mL 12   aspirin EC 325 MG tablet Take 975 mg by mouth 3 (three) times daily as needed for mild pain or moderate pain.     buPROPion (WELLBUTRIN SR) 150 MG 12 hr tablet Take 1 tablet (150 mg total) by mouth 2 (two) times daily. 60 tablet 2   busPIRone (BUSPAR) 15 MG tablet Take 1 tablet (15 mg total) by mouth 3 (three) times daily. 90 tablet 2   divalproex (DEPAKOTE) 500 MG DR tablet Take 1 tablet (500 mg total) by mouth 2 (two) times daily. 60 tablet 2   gabapentin (NEURONTIN) 400 MG capsule Take 2 capsules (800 mg total) by mouth 3 (three) times  daily. 180 capsule 2   predniSONE (DELTASONE) 10 MG tablet Take 40mg  po daily for 2 days then 30mg  daily for 2 days then 20mg  daily for 2 days then 10mg  daily for 2 days then stop 20 tablet 0   zolpidem (AMBIEN) 10 MG tablet Take 1 tablet (10 mg total) by mouth at bedtime. 30 tablet 2   No current facility-administered medications for this visit.     Musculoskeletal: Strength & Muscle Tone: Unable to assess due to telemedicine visit Gait & Station: Unable to assess due to telemedicine visit Patient leans: Unable to assess due to telemedicine visit  Psychiatric Specialty Exam: Review of Systems  Psychiatric/Behavioral:  Positive for sleep disturbance. Negative for decreased concentration, dysphoric mood, hallucinations, self-injury and suicidal ideas. The patient is not nervous/anxious and is not hyperactive.    Last menstrual period 05/27/2011.There is no height or weight on file to calculate BMI.  General Appearance: Unable to assess due to telemedicine visit  Eye Contact:  Unable to assess due to telemedicine visit  Speech:  Clear and Coherent and  Normal Rate  Volume:  Normal  Mood:  Euthymic  Affect:  Appropriate  Thought Process:  Coherent and Descriptions of Associations: Intact  Orientation:  Full (Time, Place, and Person)  Thought Content: WDL   Suicidal Thoughts:  No  Homicidal Thoughts:  No  Memory:  Immediate;   Good Recent;   Good Remote;   Good  Judgement:  Good  Insight:  Good  Psychomotor Activity:  Normal  Concentration:  Concentration: Good and Attention Span: Good  Recall:  Good  Fund of Knowledge: Good  Language: Good  Akathisia:  Negative  Handed:  Right  AIMS (if indicated): not done  Assets:  Communication Skills Desire for Improvement Financial Resources/Insurance Housing  ADL's:  Intact  Cognition: WNL  Sleep:  Fair   Screenings: GAD-7    Flowsheet Row Video Visit from 02/25/2021 in [facility] Video Visit from 11/26/2020 in [facility]  Total GAD-7 Score 3 10      PHQ2-9    Flowsheet Row Video Visit from 02/25/2021 in [facility] Video Visit from 11/26/2020 in [facility] Office Visit from 11/26/2016 in [facility] Office Visit from 08/27/2016 in [facility] Office Visit from 02/21/2016 in [facility]  PHQ-2 Total Score 0 2 0 0 0  PHQ-9 Total Score -- 11 -- -- --      Flowsheet Row Video Visit from 02/25/2021 in [facility] Video Visit from 11/26/2020 in [facility]  C-SSRS RISK CATEGORY No Risk No Risk        Assessment and Plan:   Holly Cortez is a 60 year old female with a past psychiatric history significant for insomnia, generalized anxiety disorder, and bipolar disorder who presents to [facility] via virtual telephone visit for follow-up and medication management.  Patient reports no issues or concerns regarding her  current medication regimen.  Patient denies the need for dosage adjustments at this time and is requesting refills on all her medications following the conclusion of the encounter.  Patient's medications to be e-prescribed to pharmacy of choice.  1. GAD (generalized anxiety disorder)  - gabapentin (NEURONTIN) 400 MG capsule; Take 2 capsules (800 mg total) by mouth 3 (three) times daily.  Dispense: 180 capsule; Refill: 2 - divalproex (DEPAKOTE) 500 MG  DR tablet; Take 1 tablet (500 mg total) by mouth 2 (two) times daily.  Dispense: 60 tablet; Refill: 2 - buPROPion (WELLBUTRIN SR) 150 MG 12 hr tablet; Take 1 tablet (150 mg total) by mouth 2 (two) times daily.  Dispense: 60 tablet; Refill: 2 - busPIRone (BUSPAR) 15 MG tablet; Take 1 tablet (15 mg total) by mouth 3 (three) times daily.  Dispense: 90 tablet; Refill: 2  2. Bipolar affective disorder, current episode mixed, current episode severity unspecified (HCC)  - buPROPion (WELLBUTRIN SR) 150 MG 12 hr tablet; Take 1 tablet (150 mg total) by mouth 2 (two) times daily.  Dispense: 60 tablet; Refill: 2  3. Insomnia, unspecified type  - zolpidem (AMBIEN) 10 MG tablet; Take 1 tablet (10 mg total) by mouth at bedtime.  Dispense: 30 tablet; Refill: 2  Patient to follow in 3 months Provider spent a total of 12 minutes with the patient/reviewing the patient's chart  Meta Hatchet, PA 02/25/2021, 3:03 PM

## 2021-05-27 ENCOUNTER — Telehealth (INDEPENDENT_AMBULATORY_CARE_PROVIDER_SITE_OTHER): Payer: No Payment, Other | Admitting: Physician Assistant

## 2021-05-27 ENCOUNTER — Encounter (HOSPITAL_COMMUNITY): Payer: Self-pay | Admitting: Physician Assistant

## 2021-05-27 DIAGNOSIS — G4700 Insomnia, unspecified: Secondary | ICD-10-CM

## 2021-05-27 DIAGNOSIS — F411 Generalized anxiety disorder: Secondary | ICD-10-CM

## 2021-05-27 DIAGNOSIS — F3160 Bipolar disorder, current episode mixed, unspecified: Secondary | ICD-10-CM

## 2021-05-27 MED ORDER — GABAPENTIN 400 MG PO CAPS: 800.0000 mg | ORAL_CAPSULE | Freq: Three times a day (TID) | ORAL | 2 refills | Status: DC

## 2021-05-27 MED ORDER — BUPROPION HCL ER (SR) 150 MG PO TB12: 150.0000 mg | ORAL_TABLET | Freq: Two times a day (BID) | ORAL | 2 refills | Status: DC

## 2021-05-27 MED ORDER — BUSPIRONE HCL 15 MG PO TABS: 15.0000 mg | ORAL_TABLET | Freq: Three times a day (TID) | ORAL | 2 refills | Status: DC

## 2021-05-27 MED ORDER — ZOLPIDEM TARTRATE 10 MG PO TABS: 10.0000 mg | ORAL_TABLET | Freq: Every day | ORAL | 2 refills | Status: DC

## 2021-05-27 MED ORDER — DIVALPROEX SODIUM 500 MG PO DR TAB: 500.0000 mg | DELAYED_RELEASE_TABLET | Freq: Two times a day (BID) | ORAL | 2 refills | Status: DC

## 2021-05-27 NOTE — Progress Notes (Signed)
[place] MD/PA/NP OP Progress Note  Virtual Visit via Telephone Note  I connected with Holly Cortez on 05/27/21 at 11:30 AM EST by telephone and verified that I am speaking with the correct person using two identifiers.  Location: Patient: Home Provider: Clinic   I discussed the limitations, risks, security and privacy concerns of performing an evaluation and management service by telephone and the availability of in person appointments. I also discussed with the patient that there may be a patient responsible charge related to this service. The patient expressed understanding and agreed to proceed.  Follow Up Instructions:  I discussed the assessment and treatment plan with the patient. The patient was provided an opportunity to ask questions and all were answered. The patient agreed with the plan and demonstrated an understanding of the instructions.   The patient was advised to call back or seek an in-person evaluation if the symptoms worsen or if the condition fails to improve as anticipated.  I provided 8 minutes of non-face-to-face time during this encounter.  Holly Mood, PA    05/27/2021 7:19 PM Holly Cortez  MRN:  [id number]  Chief Complaint: Follow up and medication management  HPI:   Holly Cortez is a 61 year old female with a past psychiatric history significant for insomnia, generalized anxiety disorder, and bipolar disorder who presents to [facility] via virtual telephone visit for follow-up and medication management.  Patient is currently being managed on the following medications:  Divalproex (Depakote) 500 mg DR 2 times daily Zolpidem (Ambien) 10 mg at bedtime Buspirone 15 mg 3 times daily Bupropion (Wellbutrin SR) 150 mg 12-hour tablet daily Gabapentin 400 mg (2 tablets) 3 times daily  Patient reports no issues or concerns regarding her current medication regimen.  Patient denies the need for dosage adjustments  at this time and is requesting refills on all her medications following the conclusion of the encounter.  Patient denies experiencing depressive episodes but states that her Cortez was low during the holidays.  Patient endorses some anxiety related to excitement associated with the holidays.  Patient rates her anxiety a 5 out of 10.  Patient denies any new stressors at this time.  Patient is alert and oriented x4, pleasant, calm, cooperative, and fully engaged in conversation during the encounter.  Patient endorses pretty good Cortez.  Patient denies suicidal or homicidal ideations.  She further denies auditory or visual hallucinations and does not appear to be responding to internal/external stimuli.  Patient endorses fair sleep and receives on average 4 to 5 hours of sleep each night.  Patient endorses good appetite and eats on average 3 meals per day.  Patient denies alcohol consumption, tobacco use, and illicit drug use.  Visit Diagnosis:    ICD-10-CM   1. Insomnia, unspecified type  G47.00 zolpidem (AMBIEN) 10 MG tablet    2. GAD (generalized anxiety disorder)  F41.1 busPIRone (BUSPAR) 15 MG tablet    buPROPion (WELLBUTRIN SR) 150 MG 12 hr tablet    divalproex (DEPAKOTE) 500 MG DR tablet    gabapentin (NEURONTIN) 400 MG capsule    3. Bipolar affective disorder, current episode mixed, current episode severity unspecified ([place])  F31.60 buPROPion (WELLBUTRIN SR) 150 MG 12 hr tablet      Past Psychiatric History:  Bipolar disorder Insomnia Generalized anxiety disorder  Past Medical History:  Past Medical History:  Diagnosis Date   Bipolar disorder ([place])    Emphysema lung ([place])    Emphysema of lung ([place])  Pneumonia     Past Surgical History:  Procedure Laterality Date   APPENDECTOMY     FINGER AMPUTATION Left     Family Psychiatric History:  Patient denied  Family History:  Family History  Problem Relation Age of Onset   Heart disease Mother    Hyperlipidemia Father      Social History:  Social History   Socioeconomic History   Marital status: Single    Spouse name: Not on file   Number of children: Not on file   Years of education: Not on file   Highest education level: Not on file  Occupational History   Not on file  Tobacco Use   Smoking status: Every Day    Packs/day: 0.25    Types: Cigarettes   Smokeless tobacco: Never  Vaping Use   Vaping Use: Never used  Substance and Sexual Activity   Alcohol use: No   Drug use: No   Sexual activity: Yes    Birth control/protection: Post-menopausal  Other Topics Concern   Not on file  Social History Narrative   Not on file   Social Determinants of Health   Financial Resource Strain: Not on file  Food Insecurity: Not on file  Transportation Needs: Not on file  Physical Activity: Not on file  Stress: Not on file  Social Connections: Not on file    Allergies: No Known Allergies  Metabolic Disorder Labs: Lab Results  Component Value Date   HGBA1C 5.3 02/06/2018   MPG 105 02/06/2018   MPG 117 (H) 05/10/2015   No results found for: PROLACTIN No results found for: CHOL, TRIG, HDL, CHOLHDL, VLDL, LDLCALC Lab Results  Component Value Date   TSH 0.274 (L) 05/10/2015   TSH 0.941 10/14/2012    Therapeutic Level Labs: No results found for: LITHIUM No results found for: VALPROATE No components found for:  CBMZ  Current Medications: Current Outpatient Medications  Medication Sig Dispense Refill   albuterol (PROVENTIL HFA;VENTOLIN HFA) 108 (90 BASE) MCG/ACT inhaler Inhale 2 puffs into the lungs every 6 (six) hours as needed for wheezing or shortness of breath. 1 Inhaler 2   albuterol (PROVENTIL) (2.5 MG/3ML) 0.083% nebulizer solution Take 3 mLs (2.5 mg total) by nebulization every 6 (six) hours as needed for wheezing or shortness of breath. 75 mL 12   aspirin EC 325 MG tablet Take 975 mg by mouth 3 (three) times daily as needed for mild pain or moderate pain.     buPROPion (WELLBUTRIN  SR) 150 MG 12 hr tablet Take 1 tablet (150 mg total) by mouth 2 (two) times daily. 60 tablet 2   busPIRone (BUSPAR) 15 MG tablet Take 1 tablet (15 mg total) by mouth 3 (three) times daily. 90 tablet 2   divalproex (DEPAKOTE) 500 MG DR tablet Take 1 tablet (500 mg total) by mouth 2 (two) times daily. 60 tablet 2   gabapentin (NEURONTIN) 400 MG capsule Take 2 capsules (800 mg total) by mouth 3 (three) times daily. 180 capsule 2   predniSONE (DELTASONE) 10 MG tablet Take 40mg  po daily for 2 days then 30mg  daily for 2 days then 20mg  daily for 2 days then 10mg  daily for 2 days then stop 20 tablet 0   zolpidem (AMBIEN) 10 MG tablet Take 1 tablet (10 mg total) by mouth at bedtime. 30 tablet 2   No current facility-administered medications for this visit.     Musculoskeletal: Strength & Muscle Tone: Unable to assess due to telemedicine visit Gait &  Station: Unable to assess due to telemedicine visit Patient leans: Unable to assess due to telemedicine visit  Psychiatric Specialty Exam: Review of Systems  Psychiatric/Behavioral:  Positive for sleep disturbance. Negative for decreased concentration, dysphoric Cortez, hallucinations, self-injury and suicidal ideas. The patient is nervous/anxious. The patient is not hyperactive.    Last menstrual period 05/27/2011.There is no height or weight on file to calculate BMI.  General Appearance: Unable to assess due to telemedicine visit  Eye Contact:  Unable to assess due to telemedicine visit  Speech:  Clear and Coherent and Normal Rate  Volume:  Normal  Cortez:  Anxious and Euthymic  Affect:  Appropriate and Congruent  Thought Process:  Coherent and Descriptions of Associations: Intact  Orientation:  Full (Time, Place, and Person)  Thought Content: WDL   Suicidal Thoughts:  No  Homicidal Thoughts:  No  Memory:  Immediate;   Good Recent;   Good Remote;   Good  Judgement:  Good  Insight:  Good  Psychomotor Activity:  Normal  Concentration:   Concentration: Good and Attention Span: Good  Recall:  Good  Fund of Knowledge: Good  Language: Good  Akathisia:  Negative  Handed:  Right  AIMS (if indicated): not done  Assets:  Communication Skills Desire for Improvement Financial Resources/Insurance Housing  ADL's:  Intact  Cognition: WNL  Sleep:  Fair   Screenings: GAD-7    Flowsheet Row Video Visit from 05/27/2021 in [facility] Video Visit from 02/25/2021 in [facility] Video Visit from 11/26/2020 in [facility]  Total GAD-7 Score 0 3 10      PHQ2-9    Flowsheet Row Video Visit from 05/27/2021 in [facility] Video Visit from 02/25/2021 in [facility] Video Visit from 11/26/2020 in [facility] Office Visit from 11/26/2016 in [place] Office Visit from 08/27/2016 in [place]  PHQ-2 Total Score 2 0 2 0 0  PHQ-9 Total Score 2 -- 11 -- --      Flowsheet Row Video Visit from 05/27/2021 in [facility] Video Visit from 02/25/2021 in [facility] Video Visit from 11/26/2020 in [place] No Risk No Risk No Risk        Assessment and Plan:   Holly Cortez is a 61 year old female with a past psychiatric history significant for insomnia, generalized anxiety disorder, and bipolar disorder who presents to [facility] via virtual telephone visit for follow-up and medication management.  Patient reports no issues or concerns regarding her current medication regimen.  Patient denies the need for dosage adjustments at this time and is requesting refills on all her medications following the conclusion of the encounter.  Patient's medications to be e-prescribed to pharmacy of choice.  1.  Insomnia, unspecified type  - zolpidem (AMBIEN) 10 MG tablet; Take 1 tablet (10 mg total) by mouth at bedtime.  Dispense: 30 tablet; Refill: 2  2. GAD (generalized anxiety disorder)  - busPIRone (BUSPAR) 15 MG tablet; Take 1 tablet (15 mg total) by mouth 3 (three) times daily.  Dispense: 90 tablet; Refill: 2 - buPROPion (WELLBUTRIN SR) 150 MG 12 hr tablet; Take 1 tablet (150 mg total) by mouth 2 (two) times daily.  Dispense: 60 tablet; Refill: 2 - divalproex (DEPAKOTE) 500 MG DR tablet; Take 1 tablet (500  mg total) by mouth 2 (two) times daily.  Dispense: 60 tablet; Refill: 2 - gabapentin (NEURONTIN) 400 MG capsule; Take 2 capsules (800 mg total) by mouth 3 (three) times daily.  Dispense: 180 capsule; Refill: 2  3. Bipolar affective disorder, current episode mixed, current episode severity unspecified ([place])  - buPROPion (WELLBUTRIN SR) 150 MG 12 hr tablet; Take 1 tablet (150 mg total) by mouth 2 (two) times daily.  Dispense: 60 tablet; Refill: 2  Patient to follow up in 3 months Provider spent a total of 8 minutes with the patient/reviewing patient's chart  Holly Mood, PA 05/27/2021, 7:19 PM

## 2021-08-29 ENCOUNTER — Telehealth (INDEPENDENT_AMBULATORY_CARE_PROVIDER_SITE_OTHER): Payer: No Payment, Other | Admitting: Physician Assistant

## 2021-08-29 ENCOUNTER — Encounter (HOSPITAL_COMMUNITY): Payer: Self-pay | Admitting: Physician Assistant

## 2021-08-29 DIAGNOSIS — F411 Generalized anxiety disorder: Secondary | ICD-10-CM

## 2021-08-29 DIAGNOSIS — G4700 Insomnia, unspecified: Secondary | ICD-10-CM

## 2021-08-29 DIAGNOSIS — F3160 Bipolar disorder, current episode mixed, unspecified: Secondary | ICD-10-CM | POA: Diagnosis not present

## 2021-08-29 MED ORDER — BUSPIRONE HCL 15 MG PO TABS: 15.0000 mg | ORAL_TABLET | Freq: Three times a day (TID) | ORAL | 2 refills | Status: DC

## 2021-08-29 MED ORDER — DIVALPROEX SODIUM 500 MG PO DR TAB: 500.0000 mg | DELAYED_RELEASE_TABLET | Freq: Two times a day (BID) | ORAL | 2 refills | Status: DC

## 2021-08-29 MED ORDER — ZOLPIDEM TARTRATE 10 MG PO TABS: 10.0000 mg | ORAL_TABLET | Freq: Every day | ORAL | 2 refills | Status: DC

## 2021-08-29 MED ORDER — BUPROPION HCL ER (SR) 150 MG PO TB12: 150.0000 mg | ORAL_TABLET | Freq: Two times a day (BID) | ORAL | 2 refills | Status: DC

## 2021-08-29 MED ORDER — GABAPENTIN 400 MG PO CAPS: 800.0000 mg | ORAL_CAPSULE | Freq: Three times a day (TID) | ORAL | 2 refills | Status: DC

## 2021-08-29 NOTE — Progress Notes (Signed)
[facility] MD/PA/NP OP Progress Note ? ?Virtual Visit via Telephone Note ? ?I connected with Holly Cortez on 08/29/21 at  1:30 PM EDT by telephone and verified that I am speaking with the correct person using two identifiers. ? ?Location: ?Patient: Home ?Provider: Clinic ?  ?I discussed the limitations, risks, security and privacy concerns of performing an evaluation and management service by telephone and the availability of in person appointments. I also discussed with the patient that there may be a patient responsible charge related to this service. The patient expressed understanding and agreed to proceed. ? ?Follow Up Instructions: ?  ?I discussed the assessment and treatment plan with the patient. The patient was provided an opportunity to ask questions and all were answered. The patient agreed with the plan and demonstrated an understanding of the instructions. ?  ?The patient was advised to call back or seek an in-person evaluation if the symptoms worsen or if the condition fails to improve as anticipated. ? ?I provided 9 minutes of non-face-to-face time during this encounter. ? ?Malachy Mood, PA ? ? ?08/29/2021 2:53 PM ?Holly Cortez  ?MRN:  [id number] ? ?Chief Complaint:  ?Chief Complaint  ?Patient presents with  ? Follow-up  ? Medication Refill  ? ?HPI:  ? ?Holly Cortez is a 61 year old female with a past psychiatric history significant for insomnia, generalized anxiety disorder, and bipolar disorder who presents to [facility] via virtual telephone visit for follow-up and medication management.  Patient is currently being managed on the following medications: ? ?Divalproex (Depakote) 500 mg DR 2 times daily ?Zolpidem (Ambien) 10 mg at bedtime ?Buspirone 15 mg 3 times daily ?Bupropion (Wellbutrin SR) 150 mg 12-hour tablet daily ?Gabapentin 400 mg (2 tablets) 3 times daily ? ?Patient states that she is currently getting over the flu.  Despite her recent sickness,  patient reports no issues or concerns regarding her current medication regimen.  Patient denies the need for dosage adjustments at this time.  Patient denies depressive episodes or anxiety.  Patient denies any new stressors at this time. ? ?Patient is alert and oriented x4, calm, cooperative, and fully engaged in conversation during the encounter.  Patient endorses lazy mood due to being sick and is trying not to do too much.  Patient denies suicidal or homicidal ideations.  She further denies auditory or visual hallucinations and does not appear to be responding to internal/external stimuli.  Patient endorses fair sleep and receives on average 5 to 6 hours of sleep each night.  Patient endorses good appetite and eats on average 3 meals per day.  Patient denies alcohol consumption, tobacco use, and illicit drug use. ? ?Visit Diagnosis:  ?  ICD-10-CM   ?1. Insomnia, unspecified type  G47.00 zolpidem (AMBIEN) 10 MG tablet  ?  ?2. GAD (generalized anxiety disorder)  F41.1 busPIRone (BUSPAR) 15 MG tablet  ?  buPROPion (WELLBUTRIN SR) 150 MG 12 hr tablet  ?  gabapentin (NEURONTIN) 400 MG capsule  ?  divalproex (DEPAKOTE) 500 MG DR tablet  ?  ?3. Bipolar affective disorder, current episode mixed, current episode severity unspecified ([place])  F31.60 buPROPion (WELLBUTRIN SR) 150 MG 12 hr tablet  ?  ? ? ?Past Psychiatric History:  ?Bipolar disorder ?Insomnia ?Generalized anxiety disorder ? ?Past Medical History:  ?Past Medical History:  ?Diagnosis Date  ? Bipolar disorder ([place])   ? Emphysema lung ([place])   ? Emphysema of lung ([place])   ? Pneumonia   ?  ?Past Surgical History:  ?  Procedure Laterality Date  ? APPENDECTOMY    ? FINGER AMPUTATION Left   ? ? ?Family Psychiatric History:  ?Patient denied ? ?Family History:  ?Family History  ?Problem Relation Age of Onset  ? Heart disease Mother   ? Hyperlipidemia Father   ? ? ?Social History:  ?Social History  ? ?Socioeconomic History  ? Marital status: Single  ?  Spouse name: Not on  file  ? Number of children: Not on file  ? Years of education: Not on file  ? Highest education level: Not on file  ?Occupational History  ? Not on file  ?Tobacco Use  ? Smoking status: Every Day  ?  Packs/day: 0.25  ?  Types: Cigarettes  ? Smokeless tobacco: Never  ?Vaping Use  ? Vaping Use: Never used  ?Substance and Sexual Activity  ? Alcohol use: No  ? Drug use: No  ? Sexual activity: Yes  ?  Birth control/protection: Post-menopausal  ?Other Topics Concern  ? Not on file  ?Social History Narrative  ? Not on file  ? ?Social Determinants of Health  ? ?Financial Resource Strain: Not on file  ?Food Insecurity: Not on file  ?Transportation Needs: Not on file  ?Physical Activity: Not on file  ?Stress: Not on file  ?Social Connections: Not on file  ? ? ?Allergies: No Known Allergies ? ?Metabolic Disorder Labs: ?Lab Results  ?Component Value Date  ? HGBA1C 5.3 02/06/2018  ? MPG 105 02/06/2018  ? MPG 117 (H) 05/10/2015  ? ?No results found for: PROLACTIN ?No results found for: CHOL, TRIG, HDL, CHOLHDL, VLDL, LDLCALC ?Lab Results  ?Component Value Date  ? TSH 0.274 (L) 05/10/2015  ? TSH 0.941 10/14/2012  ? ? ?Therapeutic Level Labs: ?No results found for: LITHIUM ?No results found for: VALPROATE ?No components found for:  CBMZ ? ?Current Medications: ?Current Outpatient Medications  ?Medication Sig Dispense Refill  ? albuterol (PROVENTIL HFA;VENTOLIN HFA) 108 (90 BASE) MCG/ACT inhaler Inhale 2 puffs into the lungs every 6 (six) hours as needed for wheezing or shortness of breath. 1 Inhaler 2  ? albuterol (PROVENTIL) (2.5 MG/3ML) 0.083% nebulizer solution Take 3 mLs (2.5 mg total) by nebulization every 6 (six) hours as needed for wheezing or shortness of breath. 75 mL 12  ? aspirin EC 325 MG tablet Take 975 mg by mouth 3 (three) times daily as needed for mild pain or moderate pain.    ? buPROPion (WELLBUTRIN SR) 150 MG 12 hr tablet Take 1 tablet (150 mg total) by mouth 2 (two) times daily. 60 tablet 2  ? busPIRone (BUSPAR)  15 MG tablet Take 1 tablet (15 mg total) by mouth 3 (three) times daily. 90 tablet 2  ? divalproex (DEPAKOTE) 500 MG DR tablet Take 1 tablet (500 mg total) by mouth 2 (two) times daily. 60 tablet 2  ? gabapentin (NEURONTIN) 400 MG capsule Take 2 capsules (800 mg total) by mouth 3 (three) times daily. 180 capsule 2  ? predniSONE (DELTASONE) 10 MG tablet Take 40mg  po daily for 2 days then 30mg  daily for 2 days then 20mg  daily for 2 days then 10mg  daily for 2 days then stop 20 tablet 0  ? zolpidem (AMBIEN) 10 MG tablet Take 1 tablet (10 mg total) by mouth at bedtime. 30 tablet 2  ? ?No current facility-administered medications for this visit.  ? ? ? ?Musculoskeletal: ?Strength & Muscle Tone: Unable to assess due to telemedicine visit ?Gait & Station: Unable to assess due to telemedicine visit ?Patient  leans: Unable to assess due to telemedicine visit ? ?Psychiatric Specialty Exam: ?Review of Systems  ?Psychiatric/Behavioral:  Negative for decreased concentration, dysphoric mood, hallucinations, self-injury, sleep disturbance and suicidal ideas. The patient is not nervous/anxious and is not hyperactive.    ?Last menstrual period 05/27/2011.There is no height or weight on file to calculate BMI.  ?General Appearance: Unable to assess due to telemedicine visit  ?Eye Contact:  Unable to assess due to telemedicine visit  ?Speech:  Clear and Coherent and Normal Rate  ?Volume:  Normal  ?Mood:  Euthymic  ?Affect:  Appropriate  ?Thought Process:  Coherent and Descriptions of Associations: Intact  ?Orientation:  Full (Time, Place, and Person)  ?Thought Content: WDL   ?Suicidal Thoughts:  No  ?Homicidal Thoughts:  No  ?Memory:  Immediate;   Good ?Recent;   Good ?Remote;   Good  ?Judgement:  Good  ?Insight:  Good  ?Psychomotor Activity:  Normal  ?Concentration:  Concentration: Good and Attention Span: Good  ?Recall:  Good  ?Fund of Knowledge: Good  ?Language: Good  ?Akathisia:  No  ?Handed:  Right  ?AIMS (if indicated): not done   ?Assets:  Communication Skills ?Desire for Improvement ?Financial Resources/Insurance ?Housing  ?ADL's:  Intact  ?Cognition: WNL  ?Sleep:  Fair  ? ?Screenings: ?GAD-7   ? ?Flowsheet Row Video Visit fro

## 2021-11-28 ENCOUNTER — Telehealth (INDEPENDENT_AMBULATORY_CARE_PROVIDER_SITE_OTHER): Payer: No Payment, Other | Admitting: Student in an Organized Health Care Education/Training Program

## 2021-11-28 ENCOUNTER — Encounter (HOSPITAL_COMMUNITY): Payer: Self-pay | Admitting: Student in an Organized Health Care Education/Training Program

## 2021-11-28 DIAGNOSIS — G4700 Insomnia, unspecified: Secondary | ICD-10-CM

## 2021-11-28 DIAGNOSIS — F411 Generalized anxiety disorder: Secondary | ICD-10-CM | POA: Diagnosis not present

## 2021-11-28 DIAGNOSIS — F3178 Bipolar disorder, in full remission, most recent episode mixed: Secondary | ICD-10-CM | POA: Diagnosis not present

## 2021-11-28 DIAGNOSIS — F3160 Bipolar disorder, current episode mixed, unspecified: Secondary | ICD-10-CM | POA: Diagnosis not present

## 2021-11-28 MED ORDER — DIVALPROEX SODIUM 500 MG PO DR TAB: 500.0000 mg | DELAYED_RELEASE_TABLET | Freq: Two times a day (BID) | ORAL | 2 refills | Status: DC

## 2021-11-28 MED ORDER — GABAPENTIN 400 MG PO CAPS: 800.0000 mg | ORAL_CAPSULE | Freq: Three times a day (TID) | ORAL | 2 refills | Status: DC

## 2021-11-28 MED ORDER — BUSPIRONE HCL 15 MG PO TABS: 15.0000 mg | ORAL_TABLET | Freq: Three times a day (TID) | ORAL | 2 refills | Status: DC

## 2021-11-28 MED ORDER — BUPROPION HCL ER (SR) 150 MG PO TB12: 150.0000 mg | ORAL_TABLET | Freq: Two times a day (BID) | ORAL | 2 refills | Status: DC

## 2021-11-28 NOTE — Progress Notes (Signed)
BH MD/PA/NP OP Progress Note  11/28/2021 11:00 AM Leasa Kincannon  MRN:  [id number]  Chief Complaint: No chief complaint on file.  Virtual Visit via Telephone Note  I connected with [name] on 11/28/21 at 10:30 AM EDT by telephone and verified that I am speaking with the correct person using two identifiers.  Location: Patient: Home Provider: Office   I discussed the limitations, risks, security and privacy concerns of performing an evaluation and management service by telephone and the availability of in person appointments. I also discussed with the patient that there may be a patient responsible charge related to this service. The patient expressed understanding and agreed to proceed.   History of Present Illness:   Geraldy Akridge is a 61 year old female with a past psychiatric history significant for insomnia, generalized anxiety disorder, and bipolar disorder who presents to [facility] via virtual telephone visit for follow-up and medication management.  Patient is currently being managed on the following medications:  Divalproex (Depakote) 500 mg DR 2 times daily Zolpidem (Ambien) 10 mg at bedtime Buspirone 15 mg 3 times daily Bupropion (Wellbutrin SR) 150 mg 12-hour tablet daily Gabapentin 400 mg (2 tablets) 3 times daily   Patient reports no issues or concerns regarding her current medication regimen.  Patient denies the need for dosage adjustments at this time and is requesting refills on all her medications following the conclusion of the encounter.  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  Patient reports she sleeps well and eats well and staying hydrated.  Patient denies any adverse side effects from medication.  Patient denies SI, HI and AVH.  Patient reports there are some changes going on at her job however she is feeling fairly  competent to handle the changes.    I discussed the assessment and treatment plan with the patient. The patient was provided an opportunity to ask questions and all were answered. The patient agreed with the plan and demonstrated an understanding of the instructions.   The patient was advised to call back or seek an in-person evaluation if the symptoms worsen or if the condition fails to improve as anticipated.  I provided 20 minutes of non-face-to-face time during this encounter.  PGY-3 Bobbye Morton, MD     Visit Diagnosis:    ICD-10-CM   1. Bipolar affective disorder, current episode mixed, current episode severity unspecified (HCC)  F31.60 buPROPion (WELLBUTRIN SR) 150 MG 12 hr tablet    2. GAD (generalized anxiety disorder)  F41.1 buPROPion (WELLBUTRIN SR) 150 MG 12 hr tablet    busPIRone (BUSPAR) 15 MG tablet    divalproex (DEPAKOTE) 500 MG DR tablet    gabapentin (NEURONTIN) 400 MG capsule    3. Insomnia, unspecified type  G47.00       Past Psychiatric History: Bipolar disorder Insomnia Generalized anxiety disorder  Past Medical History:  Past Medical History:  Diagnosis Date   Bipolar disorder (HCC)    Emphysema lung (HCC)    Emphysema of lung (HCC)    Pneumonia     Past Surgical History:  Procedure Laterality Date   APPENDECTOMY     FINGER AMPUTATION Left     Family Psychiatric History: Denied  Family History:  Family History  Problem Relation Age of Onset   Heart disease Mother    Hyperlipidemia Father     Social History:  Social History  Socioeconomic History   Marital status: Single    Spouse name: Not on file   Number of children: Not on file   Years of education: Not on file   Highest education level: Not on file  Occupational History   Not on file  Tobacco Use   Smoking status: Every Day    Packs/day: 0.25    Types: Cigarettes   Smokeless tobacco: Never  Vaping Use   Vaping Use: Never used  Substance and Sexual Activity    Alcohol use: No   Drug use: No   Sexual activity: Yes    Birth control/protection: Post-menopausal  Other Topics Concern   Not on file  Social History Narrative   Not on file   Social Determinants of Health   Financial Resource Strain: Not on file  Food Insecurity: Not on file  Transportation Needs: Not on file  Physical Activity: Not on file  Stress: Not on file  Social Connections: Not on file    Allergies: No Known Allergies  Metabolic Disorder Labs: Lab Results  Component Value Date   HGBA1C 5.3 02/06/2018   MPG 105 02/06/2018   MPG 117 (H) 05/10/2015   No results found for: "PROLACTIN" No results found for: "CHOL", "TRIG", "HDL", "CHOLHDL", "VLDL", "LDLCALC" Lab Results  Component Value Date   TSH 0.274 (L) 05/10/2015   TSH 0.941 10/14/2012    Therapeutic Level Labs: No results found for: "LITHIUM" No results found for: "VALPROATE" No results found for: "CBMZ"  Current Medications: Current Outpatient Medications  Medication Sig Dispense Refill   albuterol (PROVENTIL HFA;VENTOLIN HFA) 108 (90 BASE) MCG/ACT inhaler Inhale 2 puffs into the lungs every 6 (six) hours as needed for wheezing or shortness of breath. 1 Inhaler 2   albuterol (PROVENTIL) (2.5 MG/3ML) 0.083% nebulizer solution Take 3 mLs (2.5 mg total) by nebulization every 6 (six) hours as needed for wheezing or shortness of breath. 75 mL 12   aspirin EC 325 MG tablet Take 975 mg by mouth 3 (three) times daily as needed for mild pain or moderate pain.     buPROPion (WELLBUTRIN SR) 150 MG 12 hr tablet Take 1 tablet (150 mg total) by mouth 2 (two) times daily. 60 tablet 2   busPIRone (BUSPAR) 15 MG tablet Take 1 tablet (15 mg total) by mouth 3 (three) times daily. 90 tablet 2   divalproex (DEPAKOTE) 500 MG DR tablet Take 1 tablet (500 mg total) by mouth 2 (two) times daily. 60 tablet 2   gabapentin (NEURONTIN) 400 MG capsule Take 2 capsules (800 mg total) by mouth 3 (three) times daily. 180 capsule 2    predniSONE (DELTASONE) 10 MG tablet Take 40mg  po daily for 2 days then 30mg  daily for 2 days then 20mg  daily for 2 days then 10mg  daily for 2 days then stop 20 tablet 0   zolpidem (AMBIEN) 10 MG tablet Take 1 tablet (10 mg total) by mouth at bedtime. 30 tablet 2   No current facility-administered medications for this visit.     Musculoskeletal: Deferred-telephone visit  Psychiatric Specialty Exam: Review of Systems  Psychiatric/Behavioral:  Negative for hallucinations, sleep disturbance and suicidal ideas.     Last menstrual period 05/27/2011.There is no height or weight on file to calculate BMI.  General Appearance: Defer  Eye Contact: Defer  Speech:  Clear and Coherent  Volume:  Normal  Mood:  Euthymic  Affect:  NA  Thought Process:  Coherent  Orientation:  Full (Time, Place, and Person)  Thought  Content: Logical   Suicidal Thoughts:  No  Homicidal Thoughts:  No  Memory:  Immediate;   Good Recent;   Fair  Judgement:  Fair  Insight:  Fair  Psychomotor Activity:  Normal  Concentration:  Concentration: Good  Recall:  NA  Fund of Knowledge: Good  Language: Good  Akathisia:  NA    AIMS (if indicated): not done  Assets:  Architect Housing Resilience Social Support  ADL's:  Intact  Cognition: WNL  Sleep:  Good   Screenings: GAD-7    Flowsheet Row Video Visit from 08/29/2021 in [facility] Video Visit from 05/27/2021 in [facility] Video Visit from 02/25/2021 in [facility] Video Visit from 11/26/2020 in [facility]  Total GAD-7 Score 2 0 3 10      PHQ2-9    Flowsheet Row Video Visit from 08/29/2021 in [facility] Video Visit from 05/27/2021 in [facility] Video Visit from 02/25/2021 in [facility] Video Visit from 11/26/2020 in [facility] Office Visit from 11/26/2016 in [facility]  PHQ-2 Total Score 0 2 0 2 0  PHQ-9 Total Score -- 2 -- 11 --      Flowsheet Row Video Visit from 08/29/2021 in [facility] Video Visit from 05/27/2021 in [facility] Video Visit from 02/25/2021 in [facility]  C-SSRS RISK CATEGORY No Risk No Risk No Risk        Assessment and Plan: Patient appears to be stable at this time.  Patient did express having difficulty with the safety Her Medications and Expresses Wish for Provider to Reach out to Her Pharmacy.  Provider Did Reach out to Pharmacy and It Was Discovered That Patient Was Likely Receiving the Wrong Bottle Types on [facility]ident. Discussed with patient labs as there are no labs in the last 3 years. Patient reports that she is having insurance change and will have to go get a physical in the next few months, so the patient decided that she would rather write down requested labs and ensure that they have done her annual health visit.  Patient reports that her job provides incentive should she get this updated initial visit.   GAD Bupropion (Wellbutrin SR) 150 mg 12-hour tablet daily Gabapentin 400 mg (2 tablets) 3 times daily Buspirone 15 mg 3 times daily  Bipolar Affective Disorder, in Remission Divalproex (Depakote) 500 mg DR 2 times daily -Asked the patient make sure that she gets a CMP, TSH, A1c, lipid, Depakote level at her PCP appointment  Insomnia Zolpidem (Ambien) 10 mg at bedtime  F/U in 3 months  Collaboration of Care: Collaboration of Care:   Patient/Guardian was advised Release of Information must be obtained prior to any record release in order to collaborate their care with an outside provider. Patient/Guardian was advised if they have not already done so to contact the registration department to sign all necessary forms in order for [place] to release  information regarding their care.   Consent: Patient/Guardian gives verbal consent for treatment and assignment of benefits for services provided during this visit. Patient/Guardian expressed understanding and agreed to proceed.   PGY-3 Bobbye Morton, MD 11/28/2021, 11:00 AM

## 2021-11-28 NOTE — Patient Instructions (Signed)
Please get lab Work done at the following facility:  [facility]: [address], [place], [place] [address]  It is important that you NOT take you AM dose of Depakote until after you get the lab work done on that day.  Thank You

## 2022-03-02 ENCOUNTER — Telehealth (INDEPENDENT_AMBULATORY_CARE_PROVIDER_SITE_OTHER): Payer: No Payment, Other | Admitting: Student in an Organized Health Care Education/Training Program

## 2022-03-02 ENCOUNTER — Encounter (HOSPITAL_COMMUNITY): Payer: Self-pay | Admitting: Student in an Organized Health Care Education/Training Program

## 2022-03-02 DIAGNOSIS — F3175 Bipolar disorder, in partial remission, most recent episode depressed: Secondary | ICD-10-CM

## 2022-03-02 DIAGNOSIS — F3160 Bipolar disorder, current episode mixed, unspecified: Secondary | ICD-10-CM | POA: Diagnosis not present

## 2022-03-02 DIAGNOSIS — G4700 Insomnia, unspecified: Secondary | ICD-10-CM

## 2022-03-02 DIAGNOSIS — F411 Generalized anxiety disorder: Secondary | ICD-10-CM | POA: Diagnosis not present

## 2022-03-02 MED ORDER — BUPROPION HCL ER (SR) 150 MG PO TB12: 150.0000 mg | ORAL_TABLET | Freq: Two times a day (BID) | ORAL | 2 refills | Status: DC

## 2022-03-02 MED ORDER — GABAPENTIN 400 MG PO CAPS: 800.0000 mg | ORAL_CAPSULE | Freq: Three times a day (TID) | ORAL | 2 refills | Status: DC

## 2022-03-02 MED ORDER — ZOLPIDEM TARTRATE 10 MG PO TABS: 10.0000 mg | ORAL_TABLET | Freq: Every evening | ORAL | 0 refills | Status: DC | PRN

## 2022-03-02 MED ORDER — DIVALPROEX SODIUM 500 MG PO DR TAB: 500.0000 mg | DELAYED_RELEASE_TABLET | Freq: Two times a day (BID) | ORAL | 2 refills | Status: DC

## 2022-03-02 MED ORDER — ZOLPIDEM TARTRATE 10 MG PO TABS: 10.0000 mg | ORAL_TABLET | Freq: Every day | ORAL | 0 refills | Status: DC

## 2022-03-02 MED ORDER — BUSPIRONE HCL 15 MG PO TABS: 15.0000 mg | ORAL_TABLET | Freq: Three times a day (TID) | ORAL | 2 refills | Status: DC

## 2022-03-02 NOTE — Progress Notes (Signed)
BH MD/PA/NP OP Progress Note  03/02/2022 3:59 PM Holly Cortez  MRN:  [id number]  Chief Complaint:  Chief Complaint  Patient presents with   Follow-up   HPI: Virtual Visit via Telephone Note  I connected with Holly Cortez on 03/02/22 at  1:30 PM EDT by telephone and verified that I am speaking with the correct person using two identifiers.  Location: Patient: Home Provider: Office   I discussed the limitations, risks, security and privacy concerns of performing an evaluation and management service by telephone and the availability of in person appointments. I also discussed with the patient that there may be a patient responsible charge related to this service. The patient expressed understanding and agreed to proceed.   History of Present Illness:  Holly Cortez is a 61 year old female with a past psychiatric history significant for insomnia, generalized anxiety disorder, and bipolar disorder.  Patient reports that she has been compliant with the following medication regimen:  Ambien 10 mg nightly Wellbutrin SR 150 mg twice daily BuSpar 15 mg 3 times daily Gabapentin 400 mg 3 times daily Depakote DR 500 mg twice daily  Patient reports that she remains at her same job but has updated her PCP and had a change in insurance.  Patient reports that despite having change in insurance with increased access to medication she does not want to change her Wellbutrin to the XL form or any of her medications.  Patient endorses that she feels stable on her current medication regimen and would rather not making changes.  Patient reports that she is able to recognize the benefits of her medication as she recently is going through a "break up."  Patient reports that she is the person who called off the relationship and endorses that there was not much commitment involved however, she is still upset.  Patient reports that she is aware that ending the relationship was in her best interest however  she becomes tearful at the thought of the other person involved.  Patient reports that despite these intermittent moments of tearfulness, she continues to go to work and pays her bills.  Patient reports her appetite is not changed and she is able to do her day-to-day functions.  Patient reports that she is not resting well at night but endorses that she believes that this is due to the break-up.  Patient reports that she has to keep herself occupied and is currently been working a few more hours than normal, but appreciates the distraction.  Patient reports that the end of the relationship occurred approximately 3 weeks ago and she does feel that she is getting better and endorses that her dysphoric mood is only occasional.  Patient reports that she believes that her mood and insomnia are more so related to the end of the relationship and will likely resolve as she begins to feel better.  Patient denies that she is reliant on [facility] during this difficult time.  There was brief discussion with patient about discontinuing her Ambien and considering trazodone for her insomnia however, patient was again reluctant to change any of her medications and endorses she has had success in the past with Ambien and that outside the context of this acute event she feels it is beneficial.  Patient denies SI, HI and AVH.  Patient reports that she has felt a bit more anxious since her break-up, but again feels that this will improve over time.      I discussed the assessment and treatment plan with the patient.  The patient was provided an opportunity to ask questions and all were answered. The patient agreed with the plan and demonstrated an understanding of the instructions.   The patient was advised to call back or seek an in-person evaluation if the symptoms worsen or if the condition fails to improve as anticipated.  I provided 25 minutes of non-face-to-face time during this encounter.  PGY-3 Bobbye Morton, MD   Visit Diagnosis:    ICD-10-CM   1. Bipolar 1 disorder, depressed, partial remission (HCC)  F31.75 Valproic Acid level    Comprehensive Metabolic Panel (CMET)    HgB A1c    Cholesterol, Total    2. Bipolar affective disorder, current episode mixed, current episode severity unspecified (HCC)  F31.60 buPROPion (WELLBUTRIN SR) 150 MG 12 hr tablet    3. GAD (generalized anxiety disorder)  F41.1 buPROPion (WELLBUTRIN SR) 150 MG 12 hr tablet    busPIRone (BUSPAR) 15 MG tablet    divalproex (DEPAKOTE) 500 MG DR tablet    gabapentin (NEURONTIN) 400 MG capsule    4. Insomnia, unspecified type  G47.00 zolpidem (AMBIEN) 10 MG tablet      Past Psychiatric History:  Bipolar disorder Insomnia Generalized anxiety disorder  11/2021-no medication adjustments made   Past Medical History:  Past Medical History:  Diagnosis Date   Bipolar disorder (HCC)    Emphysema lung (HCC)    Emphysema of lung (HCC)    Pneumonia     Past Surgical History:  Procedure Laterality Date   APPENDECTOMY     FINGER AMPUTATION Left     Family Psychiatric History: Denied  Family History:  Family History  Problem Relation Age of Onset   Heart disease Mother    Hyperlipidemia Father     Social History:  Social History   Socioeconomic History   Marital status: Single    Spouse name: Not on file   Number of children: Not on file   Years of education: Not on file   Highest education level: Not on file  Occupational History   Not on file  Tobacco Use   Smoking status: Every Day    Packs/day: 0.25    Types: Cigarettes   Smokeless tobacco: Never  Vaping Use   Vaping Use: Never used  Substance and Sexual Activity   Alcohol use: No   Drug use: No   Sexual activity: Yes    Birth control/protection: Post-menopausal  Other Topics Concern   Not on file  Social History Narrative   Not on file   Social Determinants of Health   Financial Resource Strain: Not on file  Food Insecurity: Not on file   Transportation Needs: Not on file  Physical Activity: Not on file  Stress: Not on file  Social Connections: Not on file    Allergies: No Known Allergies  Metabolic Disorder Labs: Lab Results  Component Value Date   HGBA1C 5.3 02/06/2018   MPG 105 02/06/2018   MPG 117 (H) 05/10/2015   No results found for: "PROLACTIN" No results found for: "CHOL", "TRIG", "HDL", "CHOLHDL", "VLDL", "LDLCALC" Lab Results  Component Value Date   TSH 0.274 (L) 05/10/2015   TSH 0.941 10/14/2012    Therapeutic Level Labs: No results found for: "LITHIUM" No results found for: "VALPROATE" No results found for: "CBMZ"  Current Medications: Current Outpatient Medications  Medication Sig Dispense Refill   [START ON 04/02/2022] zolpidem (AMBIEN) 10 MG tablet Take 1 tablet (10 mg total) by mouth at bedtime as needed for sleep. 30  tablet 0   albuterol (PROVENTIL HFA;VENTOLIN HFA) 108 (90 BASE) MCG/ACT inhaler Inhale 2 puffs into the lungs every 6 (six) hours as needed for wheezing or shortness of breath. 1 Inhaler 2   albuterol (PROVENTIL) (2.5 MG/3ML) 0.083% nebulizer solution Take 3 mLs (2.5 mg total) by nebulization every 6 (six) hours as needed for wheezing or shortness of breath. 75 mL 12   aspirin EC 325 MG tablet Take 975 mg by mouth 3 (three) times daily as needed for mild pain or moderate pain.     buPROPion (WELLBUTRIN SR) 150 MG 12 hr tablet Take 1 tablet (150 mg total) by mouth 2 (two) times daily. 60 tablet 2   busPIRone (BUSPAR) 15 MG tablet Take 1 tablet (15 mg total) by mouth 3 (three) times daily. 90 tablet 2   divalproex (DEPAKOTE) 500 MG DR tablet Take 1 tablet (500 mg total) by mouth 2 (two) times daily. 60 tablet 2   gabapentin (NEURONTIN) 400 MG capsule Take 2 capsules (800 mg total) by mouth 3 (three) times daily. 180 capsule 2   predniSONE (DELTASONE) 10 MG tablet Take 40mg  po daily for 2 days then 30mg  daily for 2 days then 20mg  daily for 2 days then 10mg  daily for 2 days then stop  20 tablet 0   zolpidem (AMBIEN) 10 MG tablet Take 1 tablet (10 mg total) by mouth at bedtime. 30 tablet 0   No current facility-administered medications for this visit.     Musculoskeletal: Deferred Psychiatric Specialty Exam: Review of Systems  Psychiatric/Behavioral:  Negative for behavioral problems, hallucinations and suicidal ideas.     Last menstrual period 05/27/2011.There is no height or weight on file to calculate BMI.  General Appearance: Deferred  Eye Contact: Deferred  Speech:  Clear and Coherent  Volume:  Normal  Mood:  Dysphoric  Affect:  NA  Thought Process:  Coherent  Orientation:  Full (Time, Place, and Person)  Thought Content: Logical   Suicidal Thoughts:  No  Homicidal Thoughts:  No  Memory:  Immediate;   Good Recent;   Good  Judgement:  Fair  Insight:  Good  Psychomotor Activity:  NA  Concentration:  Concentration: Good  Recall:  Good  Fund of Knowledge: Fair  Language: Good  Akathisia:  No  Handed:    AIMS (if indicated): not done  Assets:  Communication Skills Desire for Improvement Financial Resources/Insurance Housing Resilience Social Support  ADL's:  Intact  Cognition: WNL  Sleep:  Poor   Screenings: GAD-7    Flowsheet Row Video Visit from 08/29/2021 in [facility] Video Visit from 05/27/2021 in [facility] Video Visit from 02/25/2021 in [facility] Video Visit from 11/26/2020 in [facility]  Total GAD-7 Score 2 0 3 10      PHQ2-9    Flowsheet Row Video Visit from 08/29/2021 in [facility] Video Visit from 05/27/2021 in [facility] Video Visit from 02/25/2021 in [facility] Video Visit from 11/26/2020 in [facility] Office Visit from 11/26/2016 in [facility]  PHQ-2 Total Score 0 2 0 2 0  PHQ-9 Total  Score -- 2 -- 11 --      Flowsheet Row Video Visit from 08/29/2021 in [facility] Video Visit from 05/27/2021 in [facility] Video Visit from 02/25/2021 in [facility]  C-SSRS RISK CATEGORY No Risk No Risk No Risk        Assessment and Plan: Holly Cortez is a 60 year old female with a past psychiatric history significant for insomnia, generalized anxiety disorder, and bipolar disorder. Patient continues to appear overall stable with good insight and judgment.  Patient has suffered an acute event leading to dysphoric mood, anxiety and insomnia however patient endorses improvement in her symptoms with time.  Patient also continues to feel her day-to-day needs, suggesting her disordered mood is not pathologic rather than normal coping response to the end of the relationship.  Unfortunately patient was not able to get requested Depakote labs, patient will attempt to get these before next visit.  GAD -Continue bupropion (Wellbutrin SR) 150 mg 12-hour tablet daily -Continue gabapentin 400 mg (2 tablets) 3 times daily -Continue buspirone 15 mg 3 times daily  Bipolar Affective Disorder, in Remission -Continue divalproex (Depakote) 500 mg DR 2 times daily -Asked the patient make sure that she gets a CMP, TSH, A1c, lipid, Depakote level at her PCP appointment   Insomnia -Continue zolpidem (Ambien) 10 mg at bedtime  F/U in 3 months  Collaboration of Care: Collaboration of Care:   Patient/Guardian was advised Release of Information must be obtained prior to any record release in order to collaborate their care with an outside provider. Patient/Guardian was advised if they have not already done so to contact the registration department to sign all necessary forms in order for [place] to release information regarding their care.   Consent: Patient/Guardian gives verbal consent for treatment and assignment of benefits  for services provided during this visit. Patient/Guardian expressed understanding and agreed to proceed.   PGY-3 Freida Busman, MD 03/02/2022, 3:59 PM

## 2022-03-02 NOTE — Patient Instructions (Addendum)
Please get your Depakote level, A1c ,CMP, and Lipids. Please do not take Depakote the AM of you labs until after they are complete.   [address], [place] [address] Phone number: [phone], fax: [phone] Monday-Friday: 7 AM-5 PM Drug screen: 8 AM-4 PM

## 2022-03-03 DIAGNOSIS — J069 Acute upper respiratory infection, unspecified: Secondary | ICD-10-CM | POA: Diagnosis not present

## 2022-03-03 DIAGNOSIS — Z03818 Encounter for observation for suspected exposure to other biological agents ruled out: Secondary | ICD-10-CM | POA: Diagnosis not present

## 2022-05-08 ENCOUNTER — Telehealth (HOSPITAL_COMMUNITY): Payer: Self-pay | Admitting: Student in an Organized Health Care Education/Training Program

## 2022-05-10 ENCOUNTER — Other Ambulatory Visit (HOSPITAL_COMMUNITY): Payer: Self-pay | Admitting: Student in an Organized Health Care Education/Training Program

## 2022-05-10 DIAGNOSIS — G4700 Insomnia, unspecified: Secondary | ICD-10-CM

## 2022-05-10 MED ORDER — ZOLPIDEM TARTRATE 10 MG PO TABS: 10.0000 mg | ORAL_TABLET | Freq: Every day | ORAL | 0 refills | Status: DC

## 2022-05-10 NOTE — Telephone Encounter (Signed)
Done

## 2022-05-10 NOTE — Progress Notes (Signed)
Refill of Ambien sent, to [place]. PDMP confirms patient should be out at this time. Patient has appt in 05/2022.   PGY-3 Eliseo Gum, MD

## 2022-05-11 ENCOUNTER — Other Ambulatory Visit (HOSPITAL_COMMUNITY): Payer: Self-pay | Admitting: Student in an Organized Health Care Education/Training Program

## 2022-05-11 DIAGNOSIS — F3160 Bipolar disorder, current episode mixed, unspecified: Secondary | ICD-10-CM

## 2022-05-11 DIAGNOSIS — F411 Generalized anxiety disorder: Secondary | ICD-10-CM

## 2022-05-11 MED ORDER — BUPROPION HCL ER (SR) 150 MG PO TB12: 150.0000 mg | ORAL_TABLET | Freq: Two times a day (BID) | ORAL | 2 refills | Status: DC

## 2022-05-11 MED ORDER — GABAPENTIN 400 MG PO CAPS: 800.0000 mg | ORAL_CAPSULE | Freq: Three times a day (TID) | ORAL | 2 refills | Status: DC

## 2022-05-11 MED ORDER — BUSPIRONE HCL 15 MG PO TABS: 15.0000 mg | ORAL_TABLET | Freq: Three times a day (TID) | ORAL | 2 refills | Status: DC

## 2022-05-11 MED ORDER — DIVALPROEX SODIUM 500 MG PO DR TAB: 500.0000 mg | DELAYED_RELEASE_TABLET | Freq: Two times a day (BID) | ORAL | 2 refills | Status: DC

## 2022-05-11 NOTE — Telephone Encounter (Signed)
Done

## 2022-05-11 NOTE — Telephone Encounter (Signed)
Pt called asking for a refill on both "Bipolar" Medications. She asks that they be sent to [facility], [place] - [address]  [address], [place] [address] . Next appt 06/02/22.

## 2022-06-02 ENCOUNTER — Telehealth (HOSPITAL_COMMUNITY): Payer: No Payment, Other | Admitting: Student in an Organized Health Care Education/Training Program

## 2022-06-15 ENCOUNTER — Other Ambulatory Visit (HOSPITAL_COMMUNITY): Payer: Self-pay | Admitting: Student in an Organized Health Care Education/Training Program

## 2022-06-15 ENCOUNTER — Telehealth (HOSPITAL_COMMUNITY): Payer: Self-pay | Admitting: *Deleted

## 2022-06-15 DIAGNOSIS — G4700 Insomnia, unspecified: Secondary | ICD-10-CM

## 2022-06-15 MED ORDER — ZOLPIDEM TARTRATE 10 MG PO TABS: 10.0000 mg | ORAL_TABLET | Freq: Every day | ORAL | 0 refills | Status: DC

## 2022-06-15 NOTE — Progress Notes (Signed)
Received call and fax that patient is out of Ambien 10mg  [facility]. Patient has upcoming appt and would like to talk about coming off at next appt. Will prescribe 8 pills to get to next appt.    PGY-3  Damita Dunnings, MD

## 2022-06-15 NOTE — Telephone Encounter (Signed)
Done

## 2022-06-15 NOTE — Telephone Encounter (Signed)
Pt called requesting a refill of Ambien 10 mg tabs, last filled on 05/13/22 per [facility]. Pt has an upcoming appointment on 06/23/22. Please review and advise. Thanks.

## 2022-06-23 ENCOUNTER — Telehealth (INDEPENDENT_AMBULATORY_CARE_PROVIDER_SITE_OTHER): Payer: No Payment, Other | Admitting: Student in an Organized Health Care Education/Training Program

## 2022-06-23 ENCOUNTER — Encounter (HOSPITAL_COMMUNITY): Payer: Self-pay | Admitting: Student in an Organized Health Care Education/Training Program

## 2022-06-23 DIAGNOSIS — F411 Generalized anxiety disorder: Secondary | ICD-10-CM

## 2022-06-23 DIAGNOSIS — F3160 Bipolar disorder, current episode mixed, unspecified: Secondary | ICD-10-CM | POA: Diagnosis not present

## 2022-06-23 DIAGNOSIS — G4700 Insomnia, unspecified: Secondary | ICD-10-CM

## 2022-06-23 MED ORDER — BUSPIRONE HCL 15 MG PO TABS: 15.0000 mg | ORAL_TABLET | Freq: Three times a day (TID) | ORAL | 2 refills | Status: DC

## 2022-06-23 MED ORDER — ZOLPIDEM TARTRATE 10 MG PO TABS: 10.0000 mg | ORAL_TABLET | Freq: Every day | ORAL | 0 refills | Status: DC

## 2022-06-23 MED ORDER — GABAPENTIN 400 MG PO CAPS: 800.0000 mg | ORAL_CAPSULE | Freq: Three times a day (TID) | ORAL | 2 refills | Status: DC

## 2022-06-23 MED ORDER — BUPROPION HCL ER (SR) 150 MG PO TB12: 150.0000 mg | ORAL_TABLET | Freq: Two times a day (BID) | ORAL | 2 refills | Status: DC

## 2022-06-23 MED ORDER — DIVALPROEX SODIUM 500 MG PO DR TAB: 500.0000 mg | DELAYED_RELEASE_TABLET | Freq: Two times a day (BID) | ORAL | 2 refills | Status: DC

## 2022-06-23 NOTE — Progress Notes (Signed)
[place] MD/PA/NP OP Progress Note  06/23/2022 4:43 PM [name]  MRN:  [id number]  Chief Complaint:  Chief Complaint  Patient presents with   Follow-up  Virtual Visit via Video Note  I connected with Holly Cortez on 06/23/22 at 11:00 AM EST by a video enabled telemedicine application and verified that I am speaking with the correct person using two identifiers.  Location: Patient: Home Provider: Office   I discussed the limitations of evaluation and management by telemedicine and the availability of in person appointments. The patient expressed understanding and agreed to proceed.  History of Present Illness:    Holly Cortez is a 62 year old female with a past psychiatric history significant for insomnia, generalized anxiety disorder, and bipolar disorder.  Patient reports that she has been compliant with the following medication regimen:   Ambien 10 mg nightly Wellbutrin SR 150 mg twice daily BuSpar 15 mg 3 times daily Gabapentin 400 mg 3 times daily Depakote DR 500 mg twice daily  Patient reports that she is "ok." Patient reports that work is going well. Patient reports that she has been having some issues with motivation to go to work, and normally she loves her job. Patient reports that she has been having this feeling for "a few weeks" but she still gets up to go. Patient reports that she does feel that her mood has been lower lately, but she is not sure why. Patient reports that she is sleeping "ok" and endorses that she has some issues staying asleep. Patient reports that she feels like her mind is keeping her up and not allowing her body to rest. Patient reports that she can't identify a specific stressor. Patient reports that she does not think she has been more irritable lately. Patient reports she feels like she has a sadness " I can't work through." Patient reports she has some anxiety but denies panic attacks.   Patient endorses some anhedonia (but still finds joy  in her hobbies), but she is able to complete her activities. Patient reports a good appetite is good, and denies feeling hopeless/worthless/ guilty. Patient reports that her sad days are becoming and less and less. Patient reports good concentration. Patient reports good hygiene.  Patient denies SI, HI, and AVH.   As interview goes on, patient endorses that the guy she had a romantic relationship with, has moved on and has a new girlfriend (since November). Patient reports that this is all over facebook and that some days she wakes up with him as the first thing on her mind, and she is still really hurt. Patient reports she is blocking him on social media but is still struggling with the break up. Patient reports that talking with her sister is helping and she tries not to think too much about it. Patient reports that she is upset that she let a "man get to me." Patient reports that they had been in a relationship for a few years.    In regards to her Ambien use, patient reports that she can sleep a few hours when exhausted from work, but she has always struggled to stay asleep long enough to get good rest.    I discussed the assessment and treatment plan with the patient. The patient was provided an opportunity to ask questions and all were answered. The patient agreed with the plan and demonstrated an understanding of the instructions.   The patient was advised to call back or seek an in-person evaluation if the symptoms worsen or if  the condition fails to improve as anticipated.  I provided 30 minutes of non-face-to-face time during this encounter.   Freida Busman, MD  Visit Diagnosis:    ICD-10-CM   1. GAD (generalized anxiety disorder)  F41.1 buPROPion (WELLBUTRIN SR) 150 MG 12 hr tablet    busPIRone (BUSPAR) 15 MG tablet    divalproex (DEPAKOTE) 500 MG DR tablet    gabapentin (NEURONTIN) 400 MG capsule    2. Bipolar affective disorder, current episode mixed, current episode severity  unspecified ([place])  F31.60 buPROPion (WELLBUTRIN SR) 150 MG 12 hr tablet    3. Insomnia, unspecified type  G47.00 zolpidem (AMBIEN) 10 MG tablet      Past Psychiatric History:  Bipolar disorder Insomnia Generalized anxiety disorder   11/2021-no medication adjustments made  02/2022-no medication adjustments made   Past Medical History:  Past Medical History:  Diagnosis Date   Bipolar disorder ([place])    Emphysema lung (H[facility])    Emphysema of lung ([place])    Pneumonia     Past Surgical History:  Procedure Laterality Date   APPENDECTOMY     FINGER AMPUTATION Left     Family Psychiatric History: Denies  Family History:  Family History  Problem Relation Age of Onset   Heart disease Mother    Hyperlipidemia Father     Social History:  Social History   Socioeconomic History   Marital status: Single    Spouse name: Not on file   Number of children: Not on file   Years of education: Not on file   Highest education level: Not on file  Occupational History   Not on file  Tobacco Use   Smoking status: Every Day    Packs/day: 0.25    Types: Cigarettes   Smokeless tobacco: Never  Vaping Use   Vaping Use: Never used  Substance and Sexual Activity   Alcohol use: No   Drug use: No   Sexual activity: Yes    Birth control/protection: Post-menopausal  Other Topics Concern   Not on file  Social History Narrative   Not on file   Social Determinants of Health   Financial Resource Strain: Not on file  Food Insecurity: Not on file  Transportation Needs: Not on file  Physical Activity: Not on file  Stress: Not on file  Social Connections: Not on file    Allergies: No Known Allergies  Metabolic Disorder Labs: Lab Results  Component Value Date   HGBA1C 5.3 02/06/2018   MPG 105 02/06/2018   MPG 117 (H) 05/10/2015   No results found for: "PROLACTIN" No results found for: "CHOL", "TRIG", "HDL", "CHOLHDL", "VLDL", "LDLCALC" Lab Results  Component Value Date   TSH  0.274 (L) 05/10/2015   TSH 0.941 10/14/2012    Therapeutic Level Labs: No results found for: "LITHIUM" No results found for: "VALPROATE" No results found for: "CBMZ"  Current Medications: Current Outpatient Medications  Medication Sig Dispense Refill   albuterol (PROVENTIL HFA;VENTOLIN HFA) 108 (90 BASE) MCG/ACT inhaler Inhale 2 puffs into the lungs every 6 (six) hours as needed for wheezing or shortness of breath. 1 Inhaler 2   albuterol (PROVENTIL) (2.5 MG/3ML) 0.083% nebulizer solution Take 3 mLs (2.5 mg total) by nebulization every 6 (six) hours as needed for wheezing or shortness of breath. 75 mL 12   aspirin EC 325 MG tablet Take 975 mg by mouth 3 (three) times daily as needed for mild pain or moderate pain.     buPROPion (WELLBUTRIN SR) 150 MG 12  hr tablet Take 1 tablet (150 mg total) by mouth 2 (two) times daily. 60 tablet 2   busPIRone (BUSPAR) 15 MG tablet Take 1 tablet (15 mg total) by mouth 3 (three) times daily. 90 tablet 2   divalproex (DEPAKOTE) 500 MG DR tablet Take 1 tablet (500 mg total) by mouth 2 (two) times daily. 60 tablet 2   gabapentin (NEURONTIN) 400 MG capsule Take 2 capsules (800 mg total) by mouth 3 (three) times daily. 180 capsule 2   predniSONE (DELTASONE) 10 MG tablet Take 40mg  po daily for 2 days then 30mg  daily for 2 days then 20mg  daily for 2 days then 10mg  daily for 2 days then stop 20 tablet 0   zolpidem (AMBIEN) 10 MG tablet Take 1 tablet (10 mg total) by mouth at bedtime. 8 tablet 0   No current facility-administered medications for this visit.     Musculoskeletal: Defer  Psychiatric Specialty Exam: Review of Systems  Psychiatric/Behavioral:  Positive for dysphoric mood. Negative for hallucinations, sleep disturbance and suicidal ideas.     Last menstrual period 05/27/2011.There is no height or weight on file to calculate BMI.  General Appearance: Casual  Eye Contact:  Good  Speech:  Clear and Coherent  Volume:  Normal  Mood:  Euthymic   Affect:  Appropriate  Thought Process:  Coherent  Orientation:  Full (Time, Place, and Person)  Thought Content: Logical   Suicidal Thoughts:  No  Homicidal Thoughts:  No  Memory:  Immediate;   Good Recent;   Good  Judgement:  Fair  Insight:  Fair  Psychomotor Activity:  Normal  Concentration:  Concentration: Good  Recall:  NA  Fund of Knowledge: Fair  Language: Good  Akathisia:  No  Handed:    AIMS (if indicated): not done  Assets:  Communication Skills Desire for Improvement Housing Resilience Social Support Transportation  ADL's:  Intact  Cognition: WNL  Sleep:  Fair   Screenings: GAD-7    Flowsheet Row Video Visit from 08/29/2021 in [facility] Video Visit from 05/27/2021 in [facility] Video Visit from 02/25/2021 in [facility] Video Visit from 11/26/2020 in [facility]  Total GAD-7 Score 2 0 3 10      PHQ2-9    Flowsheet Row Video Visit from 08/29/2021 in [facility] Video Visit from 05/27/2021 in [facility] Video Visit from 02/25/2021 in [facility] Video Visit from 11/26/2020 in [facility] Office Visit from 11/26/2016 in [place]  PHQ-2 Total Score 0 2 0 2 0  PHQ-9 Total Score -- 2 -- 11 --      Flowsheet Row Video Visit from 08/29/2021 in [facility] Video Visit from 05/27/2021 in [facility] Video Visit from 02/25/2021 in [place] No Risk No Risk No Risk        Assessment and Plan:  Holly Cortez is a 62 year old female with a past psychiatric history significant for insomnia, generalized anxiety disorder, and bipolar disorder. Patient appears to be fairly stable with decent insight and judgement. Although  she is describing some mild depression she was eventually able to admit that is secondary to loss of a romantic relationship, which is a  fairly reasonable response especially given the length of the relationship. Patient continues to go about her daily  life and does not allow her mood to dictate her behaviors, which is promising. Patient is already noticing that time is allowing her to heal and she is having fewer sad days. Do not believe she needs medication adjustments at this time. Patient also endorses belief that the winter season is a bit depressing for her and now that the days are getting longer, she is starting to feel that this is helping. Explained to patient that in the future she may benefit from a mild increase in her Depakote ( extra 250mg ) in the winter months, but it is likely not necessary now as the days are getting longer. Patient agreed that she does not think she needs this now. Will continue patient's Ambien for now as she is on the CR version, but did discuss with patient risks on the medication that increase as she gets older.    GAD-stable -Continue bupropion (Wellbutrin SR) 150 mg 12-hour tablet daily -Continue gabapentin 400 mg (2 tablets) 3 times daily -Continue buspirone 15 mg 3 times daily - patient still needs updated labs  Bipolar Affective Disorder, in Remission -Continue divalproex (Depakote) 500 mg DR 2 times daily -Asked the patient make sure that she gets a CMP, TSH, A1c, lipid, Depakote level at her PCP appointment   Insomnia -Continue zolpidem (Ambien) 10 mg at bedtime  F/U in 3 months   Collaboration of Care: Collaboration of Care:   Patient/Guardian was advised Release of Information must be obtained prior to any record release in order to collaborate their care with an outside provider. Patient/Guardian was advised if they have not already done so to contact the registration department to sign all necessary forms in order for 08-10-1983 to release information  regarding their care.   Consent: Patient/Guardian gives verbal consent for treatment and assignment of benefits for services provided during this visit. Patient/Guardian expressed understanding and agreed to proceed.   PGY-3 [place], MD 06/23/2022, 4:43 PM

## 2022-07-01 ENCOUNTER — Telehealth (HOSPITAL_COMMUNITY): Payer: Self-pay | Admitting: Student in an Organized Health Care Education/Training Program

## 2022-07-02 ENCOUNTER — Other Ambulatory Visit (HOSPITAL_COMMUNITY): Payer: Self-pay | Admitting: Student in an Organized Health Care Education/Training Program

## 2022-07-02 DIAGNOSIS — G4700 Insomnia, unspecified: Secondary | ICD-10-CM

## 2022-07-02 MED ORDER — ZOLPIDEM TARTRATE 10 MG PO TABS: 10.0000 mg | ORAL_TABLET | Freq: Every day | ORAL | 1 refills | Status: DC

## 2022-07-02 NOTE — Telephone Encounter (Signed)
Done

## 2022-07-02 NOTE — Progress Notes (Signed)
Sent in refill of Ambien, due to provider error of only sending 8 pills at time of visit.

## 2022-09-01 ENCOUNTER — Telehealth (HOSPITAL_COMMUNITY): Payer: Self-pay | Admitting: Student in an Organized Health Care Education/Training Program

## 2022-09-01 ENCOUNTER — Other Ambulatory Visit (HOSPITAL_COMMUNITY): Payer: Self-pay | Admitting: Student in an Organized Health Care Education/Training Program

## 2022-09-01 DIAGNOSIS — G4700 Insomnia, unspecified: Secondary | ICD-10-CM

## 2022-09-01 MED ORDER — ZOLPIDEM TARTRATE 10 MG PO TABS: 10.0000 mg | ORAL_TABLET | Freq: Every day | ORAL | 0 refills | Status: DC

## 2022-09-01 NOTE — Telephone Encounter (Signed)
Done

## 2022-09-01 NOTE — Progress Notes (Signed)
Patient needs refill of Ambien,PDMP was appropriate. Patient has a appt for 09/25/22. Will send refill.   PGY-3 Eliseo Gum, MD

## 2022-09-25 ENCOUNTER — Telehealth (INDEPENDENT_AMBULATORY_CARE_PROVIDER_SITE_OTHER): Payer: No Payment, Other | Admitting: Student in an Organized Health Care Education/Training Program

## 2022-09-25 ENCOUNTER — Telehealth (HOSPITAL_COMMUNITY): Payer: No Payment, Other | Admitting: Student in an Organized Health Care Education/Training Program

## 2022-09-25 DIAGNOSIS — F411 Generalized anxiety disorder: Secondary | ICD-10-CM | POA: Diagnosis not present

## 2022-09-25 DIAGNOSIS — F3160 Bipolar disorder, current episode mixed, unspecified: Secondary | ICD-10-CM | POA: Diagnosis not present

## 2022-09-25 DIAGNOSIS — F3178 Bipolar disorder, in full remission, most recent episode mixed: Secondary | ICD-10-CM

## 2022-09-25 DIAGNOSIS — G4700 Insomnia, unspecified: Secondary | ICD-10-CM | POA: Diagnosis not present

## 2022-09-25 MED ORDER — BUSPIRONE HCL 15 MG PO TABS: 15.0000 mg | ORAL_TABLET | Freq: Three times a day (TID) | ORAL | 2 refills | Status: DC

## 2022-09-25 MED ORDER — ZOLPIDEM TARTRATE 10 MG PO TABS: 10.0000 mg | ORAL_TABLET | Freq: Every evening | ORAL | 0 refills | Status: DC | PRN

## 2022-09-25 MED ORDER — DIVALPROEX SODIUM ER 500 MG PO TB24
1000.0000 mg | ORAL_TABLET | Freq: Every day | ORAL | 0 refills | Status: DC
Start: 2022-09-25 — End: 2022-12-02

## 2022-09-25 MED ORDER — ZOLPIDEM TARTRATE 10 MG PO TABS
10.0000 mg | ORAL_TABLET | Freq: Every day | ORAL | 0 refills | Status: DC
Start: 2022-09-25 — End: 2023-01-08

## 2022-09-25 MED ORDER — BUPROPION HCL ER (XL) 300 MG PO TB24
300.0000 mg | ORAL_TABLET | ORAL | 2 refills | Status: DC
Start: 2022-09-25 — End: 2023-01-08

## 2022-09-25 MED ORDER — GABAPENTIN 400 MG PO CAPS: 400.0000 mg | ORAL_CAPSULE | Freq: Three times a day (TID) | ORAL | 2 refills | Status: DC

## 2022-09-25 NOTE — Progress Notes (Signed)
Virtual Visit via Video Note  I connected with Holly Cortez on 09/25/22 at 11:00 AM EDT by a video enabled telemedicine application and verified that I am speaking with the correct person using two identifiers.  Location: Patient: Home Provider: Office   I discussed the limitations of evaluation and management by telemedicine and the availability of in person appointments. The patient expressed understanding and agreed to proceed.     I discussed the assessment and treatment plan with the patient. The patient was provided an opportunity to ask questions and all were answered. The patient agreed with the plan and demonstrated an understanding of the instructions.   The patient was advised to call back or seek an in-person evaluation if the symptoms worsen or if the condition fails to improve as anticipated.  I provided 25 minutes of non-face-to-face time during this encounter.   Bobbye Morton, MD  [facility] MD/PA/NP OP Progress Note  09/25/2022 1:37 PM Holly Cortez  MRN:  [id number]  Chief Complaint:  Chief Complaint  Patient presents with   Follow-up   HPI: Holly Cortez is a 62 year old female with a past psychiatric history significant for insomnia, generalized anxiety disorder, and bipolar disorder.  Patient reports that she has been compliant with the following medication regimen:   Ambien 10 mg nightly Wellbutrin SR 150 mg twice daily BuSpar 15 mg 3 times daily Gabapentin 400 mg 3 times daily Depakote DR 1000mg  QHS  Patient reports that things are ok. Patient reports that her mood is ok, she has some financial stressors. She reports that things will get better, she knows but right now she is stressed and feels guilty about some bills. Patient reports that she is not sleeping well, but she thinks that this is due to lack of schedule, as her job as slowed down. Patient reports she thinks she is getting about 4h of sleep and thinks that her anxiety is really high. Patient  does not think she has been irritable, but she has been a bit  more sad due to bills and less work. She reports that she really loves her job, but there aren't has many jobs her place of work has taken. Patient denies SI, HI, and AVH. Patient does feel like she can control her worrying. She denies anhedonia and endorses a good appetite. Patient reports that she thinks her energy level is stable. Patient denies concerns from family. Patient denies hopelessness or worthlessness. Patient reports she feels like she has coping sklls to deal with her anxiety.    Will come get labs done.  Visit Diagnosis:    ICD-10-CM   1. Bipolar disorder, in full remission, most recent episode mixed (HCC)  F31.78     2. GAD (generalized anxiety disorder)  F41.1 gabapentin (NEURONTIN) 400 MG capsule    busPIRone (BUSPAR) 15 MG tablet    3. Bipolar affective disorder, current episode mixed, current episode severity unspecified (HCC)  F31.60 divalproex (DEPAKOTE ER) 500 MG 24 hr tablet    buPROPion (WELLBUTRIN XL) 300 MG 24 hr tablet    TSH    4. Insomnia, unspecified type  G47.00 zolpidem (AMBIEN) 10 MG tablet      Past Psychiatric History: Bipolar disorder Insomnia Generalized anxiety disorder   11/2021-no medication adjustments made  02/2022-no medication adjustments made 05/2022-describing some mild depression she was eventually able to admit that is secondary to loss of a romantic relationship, which is a fairly reasonable response especially given the length of the relationship. Patient continues to go  about her daily life and does not allow her mood to dictate her behaviors. No medication changes made  Past Medical History:  Past Medical History:  Diagnosis Date   Bipolar disorder (HCC)    Emphysema lung (HCC)    Emphysema of lung (HCC)    Pneumonia     Past Surgical History:  Procedure Laterality Date   APPENDECTOMY     FINGER AMPUTATION Left     Family Psychiatric History: denies  Family  History:  Family History  Problem Relation Age of Onset   Heart disease Mother    Hyperlipidemia Father     Social History:  Social History   Socioeconomic History   Marital status: Single    Spouse name: Not on file   Number of children: Not on file   Years of education: Not on file   Highest education level: Not on file  Occupational History   Not on file  Tobacco Use   Smoking status: Every Day    Packs/day: .25    Types: Cigarettes   Smokeless tobacco: Never  Vaping Use   Vaping Use: Never used  Substance and Sexual Activity   Alcohol use: No   Drug use: No   Sexual activity: Yes    Birth control/protection: Post-menopausal  Other Topics Concern   Not on file  Social History Narrative   Not on file   Social Determinants of Health   Financial Resource Strain: Not on file  Food Insecurity: Not on file  Transportation Needs: Not on file  Physical Activity: Not on file  Stress: Not on file  Social Connections: Not on file    Allergies: No Known Allergies  Metabolic Disorder Labs: Lab Results  Component Value Date   HGBA1C 5.3 02/06/2018   MPG 105 02/06/2018   MPG 117 (H) 05/10/2015   No results found for: "PROLACTIN" No results found for: "CHOL", "TRIG", "HDL", "CHOLHDL", "VLDL", "LDLCALC" Lab Results  Component Value Date   TSH 0.274 (L) 05/10/2015   TSH 0.941 10/14/2012    Therapeutic Level Labs: No results found for: "LITHIUM" No results found for: "VALPROATE" No results found for: "CBMZ"  Current Medications: Current Outpatient Medications  Medication Sig Dispense Refill   buPROPion (WELLBUTRIN XL) 300 MG 24 hr tablet Take 1 tablet (300 mg total) by mouth every morning. 30 tablet 2   divalproex (DEPAKOTE ER) 500 MG 24 hr tablet Take 2 tablets (1,000 mg total) by mouth at bedtime. 60 tablet 0   [START ON 10/26/2022] zolpidem (AMBIEN) 10 MG tablet Take 1 tablet (10 mg total) by mouth at bedtime as needed for sleep. 30 tablet 0   albuterol  (PROVENTIL HFA;VENTOLIN HFA) 108 (90 BASE) MCG/ACT inhaler Inhale 2 puffs into the lungs every 6 (six) hours as needed for wheezing or shortness of breath. 1 Inhaler 2   albuterol (PROVENTIL) (2.5 MG/3ML) 0.083% nebulizer solution Take 3 mLs (2.5 mg total) by nebulization every 6 (six) hours as needed for wheezing or shortness of breath. 75 mL 12   aspirin EC 325 MG tablet Take 975 mg by mouth 3 (three) times daily as needed for mild pain or moderate pain.     busPIRone (BUSPAR) 15 MG tablet Take 1 tablet (15 mg total) by mouth 3 (three) times daily. 90 tablet 2   gabapentin (NEURONTIN) 400 MG capsule Take 1 capsule (400 mg total) by mouth 3 (three) times daily. 90 capsule 2   predniSONE (DELTASONE) 10 MG tablet Take 40mg  po daily for  2 days then 30mg  daily for 2 days then 20mg  daily for 2 days then 10mg  daily for 2 days then stop 20 tablet 0   zolpidem (AMBIEN) 10 MG tablet Take 1 tablet (10 mg total) by mouth at bedtime. 30 tablet 0   No current facility-administered medications for this visit.      Psychiatric Specialty Exam: Review of Systems  Psychiatric/Behavioral:  Positive for sleep disturbance. Negative for dysphoric mood, hallucinations and suicidal ideas. The patient is nervous/anxious.     Last menstrual period 05/27/2011.There is no height or weight on file to calculate BMI.  General Appearance: Casual  Eye Contact:  Good  Speech:  Clear and Coherent  Volume:  Normal  Mood:  Euthymic  Affect:  Appropriate  Thought Process:  Coherent  Orientation:  Full (Time, Place, and Person)  Thought Content: Illogical   Suicidal Thoughts:  No  Homicidal Thoughts:  No  Memory:  Immediate;   Good Recent;   Fair  Judgement:  Good  Insight:  Good  Psychomotor Activity:  Normal  Concentration:  Concentration: Good  Recall:  NA  Fund of Knowledge: Good  Language: Good  Akathisia:  NA  Handed:    AIMS (if indicated): not done  Assets:  Communication Skills Desire for  Improvement Housing Leisure Time Resilience Social Support  ADL's:  Intact  Cognition: WNL  Sleep:  Poor   Screenings: GAD-7    Flowsheet Row Video Visit from 08/29/2021 in [facility] Video Visit from 05/27/2021 in [facility] Video Visit from 02/25/2021 in [facility] Video Visit from 11/26/2020 in [facility]  Total GAD-7 Score 2 0 3 10      PHQ2-9    Flowsheet Row Video Visit from 08/29/2021 in [facility] Video Visit from 05/27/2021 in [facility] Video Visit from 02/25/2021 in [facility] Video Visit from 11/26/2020 in [facility] Office Visit from 11/26/2016 in [facility]  PHQ-2 Total Score 0 2 0 2 0  PHQ-9 Total Score -- 2 -- 11 --      Flowsheet Row Video Visit from 08/29/2021 in [facility] Video Visit from 05/27/2021 in [facility] Video Visit from 02/25/2021 in [facility]  C-SSRS RISK CATEGORY No Risk No Risk No Risk        Assessment and Plan: Patient appears to endorse some low mood and anxiety however she has good insight and recognizes that this is secondary to some acute changes in workflow, and endorses belief that it will get better once work picks up again.  Patient continues to enjoy her work.  Patient endorses having coping skills and does not feel that her medication needs much adjustment and does not appear to endorse clinical depression.  Did discuss with patient the importance of getting Depakote levels, patient endorsed understanding will present for labs in the next month.  Also discussed with patient that despite taking Ambien and Depakote she is having only 4 hours of sleep.  At this time will refill however if patient continues to not sleep  well, concerned that Ambien is not of much benefit and may need to be discontinued to minimize polypharmacy and adverse side effects from chronic Ambien use.  Patient Depakote will be changed to extended release form as she was not taking tablets twice daily  and instead was taking them nightly, this may help patient get more benefit for her mood.  We will also consolidate patient's Wellbutrin to XL as taking the nighttime dose may also be negatively impacting her ability to sleep.  In the future could consolidate and increase night time dose of gabapentin to help with sleep.  GAD-stable - Change Wellbutrin to Wellbutrin XL 300 mg daily -Continue gabapentin 400 mg (2 tablets) 3 times daily -Continue buspirone 15 mg 3 times daily  Bipolar Affective Disorder, in Remission - Change Depakote to ER 1000 mg nightly -Asked the patient make sure that she gets a CMP, TSH, A1c, lipid, Depakote level at her PCP appointment   Insomnia -Continue zolpidem (Ambien) 10 mg at bedtime   Collaboration of Care: Collaboration of Care:   Patient/Guardian was advised Release of Information must be obtained prior to any record release in order to collaborate their care with an outside provider. Patient/Guardian was advised if they have not already done so to contact the registration department to sign all necessary forms in order for [place] to release information regarding their care.   Consent: Patient/Guardian gives verbal consent for treatment and assignment of benefits for services provided during this visit. Patient/Guardian expressed understanding and agreed to proceed.   PGY-3 Bobbye Morton, MD 09/25/2022, 1:37 PM

## 2022-10-21 ENCOUNTER — Other Ambulatory Visit (HOSPITAL_COMMUNITY): Payer: Self-pay

## 2022-10-26 ENCOUNTER — Telehealth (HOSPITAL_COMMUNITY): Payer: Self-pay | Admitting: *Deleted

## 2022-10-26 ENCOUNTER — Encounter (HOSPITAL_COMMUNITY): Payer: Self-pay

## 2022-10-26 ENCOUNTER — Other Ambulatory Visit (HOSPITAL_COMMUNITY): Payer: Self-pay

## 2022-10-26 NOTE — Telephone Encounter (Signed)
Per Dr. Adrian Blackwater to schedule this patient as a New Pt with him. Called patient and was not able to reach her. Staff LMOM.

## 2022-12-02 ENCOUNTER — Ambulatory Visit (HOSPITAL_COMMUNITY): Payer: Self-pay | Admitting: Student

## 2022-12-02 ENCOUNTER — Other Ambulatory Visit (HOSPITAL_COMMUNITY): Payer: Self-pay | Admitting: Student in an Organized Health Care Education/Training Program

## 2022-12-02 ENCOUNTER — Telehealth (HOSPITAL_COMMUNITY): Payer: Self-pay | Admitting: *Deleted

## 2022-12-02 DIAGNOSIS — G4700 Insomnia, unspecified: Secondary | ICD-10-CM

## 2022-12-02 DIAGNOSIS — F3160 Bipolar disorder, current episode mixed, unspecified: Secondary | ICD-10-CM

## 2022-12-02 MED ORDER — DIVALPROEX SODIUM ER 500 MG PO TB24: 1000.0000 mg | ORAL_TABLET | Freq: Every day | ORAL | 0 refills | Status: DC

## 2022-12-02 MED ORDER — ZOLPIDEM TARTRATE 10 MG PO TABS
10.0000 mg | ORAL_TABLET | Freq: Every evening | ORAL | 0 refills | Status: DC | PRN
Start: 2022-12-02 — End: 2023-01-08

## 2022-12-02 NOTE — Telephone Encounter (Signed)
Patient called asking for refills of Depakote and Ambien. Looked at last appointment for today that states she was to make an appointment with [facility]. She states that she tried but was told she needed a referral from a PCP. Gave her the number to the [place] office to call and told her a message would be sent to Dr. Morrie Sheldon.

## 2022-12-02 NOTE — Telephone Encounter (Signed)
I did send a referral, she has an appt with Dr. Adrian Blackwater on 01/08/2023, but I will refill her meds.

## 2022-12-07 ENCOUNTER — Telehealth (HOSPITAL_COMMUNITY): Payer: Self-pay | Admitting: *Deleted

## 2022-12-07 NOTE — Telephone Encounter (Signed)
Patient called office and [facility] on July 12 at 1:20 pm stating she is needing refills for several of her medications that she is out of. Staff called patient back and was not able to reach her. Staff was trying to inform patient that due to her not already being an established patient yet, unfortunately office can not fill medication until her appt with provider. Staff requested on voicemail that patient call office back. Office number was also provided on voicemail.

## 2023-01-08 ENCOUNTER — Ambulatory Visit (INDEPENDENT_AMBULATORY_CARE_PROVIDER_SITE_OTHER): Payer: No Payment, Other | Admitting: Psychiatry

## 2023-01-08 ENCOUNTER — Encounter (HOSPITAL_COMMUNITY): Payer: Self-pay | Admitting: Psychiatry

## 2023-01-08 DIAGNOSIS — F5104 Psychophysiologic insomnia: Secondary | ICD-10-CM | POA: Diagnosis not present

## 2023-01-08 DIAGNOSIS — F411 Generalized anxiety disorder: Secondary | ICD-10-CM | POA: Diagnosis not present

## 2023-01-08 DIAGNOSIS — Z8659 Personal history of other mental and behavioral disorders: Secondary | ICD-10-CM | POA: Diagnosis not present

## 2023-01-08 DIAGNOSIS — F1320 Sedative, hypnotic or anxiolytic dependence, uncomplicated: Secondary | ICD-10-CM | POA: Diagnosis not present

## 2023-01-08 DIAGNOSIS — F17200 Nicotine dependence, unspecified, uncomplicated: Secondary | ICD-10-CM | POA: Diagnosis not present

## 2023-01-08 DIAGNOSIS — E559 Vitamin D deficiency, unspecified: Secondary | ICD-10-CM

## 2023-01-08 DIAGNOSIS — Z79899 Other long term (current) drug therapy: Secondary | ICD-10-CM

## 2023-01-08 DIAGNOSIS — G4700 Insomnia, unspecified: Secondary | ICD-10-CM

## 2023-01-08 DIAGNOSIS — M1990 Unspecified osteoarthritis, unspecified site: Secondary | ICD-10-CM

## 2023-01-08 HISTORY — DX: Other long term (current) drug therapy: Z79.899

## 2023-01-08 HISTORY — DX: Sedative, hypnotic or anxiolytic dependence, uncomplicated: F13.20

## 2023-01-08 MED ORDER — GABAPENTIN 400 MG PO CAPS
400.0000 mg | ORAL_CAPSULE | Freq: Three times a day (TID) | ORAL | 2 refills | Status: DC
Start: 2023-01-08 — End: 2023-04-09

## 2023-01-08 MED ORDER — BUSPIRONE HCL 15 MG PO TABS: 15.0000 mg | ORAL_TABLET | Freq: Three times a day (TID) | ORAL | 2 refills | Status: DC

## 2023-01-08 MED ORDER — ZOLPIDEM TARTRATE 5 MG PO TABS
7.5000 mg | ORAL_TABLET | Freq: Every evening | ORAL | 0 refills | Status: DC | PRN
Start: 2023-01-08 — End: 2023-02-05

## 2023-01-08 MED ORDER — BUPROPION HCL ER (XL) 300 MG PO TB24
300.0000 mg | ORAL_TABLET | ORAL | 2 refills | Status: DC
Start: 2023-01-08 — End: 2023-04-09

## 2023-01-08 MED ORDER — DIVALPROEX SODIUM ER 500 MG PO TB24
1000.0000 mg | ORAL_TABLET | Freq: Every day | ORAL | 2 refills | Status: DC
Start: 2023-01-08 — End: 2023-04-09

## 2023-01-08 NOTE — Progress Notes (Signed)
Psychiatric Initial Adult Assessment  Patient Identification: Holly Cortez MRN:  [id number] Date of Evaluation:  01/08/2023 Referral Source: [facility]  Assessment:  Holly Cortez is a 62 y.o. female with a history of generalized anxiety disorder, psychophysiologic insomnia with long-term use of Ambien, tobacco use disorder with COPD, arthritis of hips and knees, historical diagnosis of bipolar disorder who presents to [facility] via video conferencing for initial evaluation of medication refill for insomnia and anxiety.  Patient reported medication regimen consisting of Wellbutrin XL, Depakote ER, Ambien, BuSpar, gabapentin had overall these in control of psychiatric symptoms and primarily looking for medication refill.  However and on discussion the patient only sleeps about 3 to 4 hours per night and while endorsing feeling rested had frequent vivid dreams and nightmares with racing thoughts when trying to go to sleep.  Given that she was on Ambien for 6 years we will need to do slow taper prior to discontinuing and had direct discussion that this would not be continued in the long term as it is not intended as a chronic medication.  She did have some anxiety around this but was amenable to trial.  Did not appear that she had been on many medications previously when managed by [facility] and it is possible that Wellbutrin could be contributing to insomnia.  With the insomnia, have much lower suspicion that this is bipolar spectrum of illness given outside of difficulty sleeping for up to 3 days at a time she did not have any of the other criterion for bipolar 1 or bipolar 2 disorder with only notable exception being she is a chronic project starter even when sleeping well.  I do question that diagnosis and may be able to make change to Depakote in the future and we will try to get updated blood level along with monitoring labs.  It was more likely that she  had an aggressive disorder with insomnia, which the depression appeared to be in remission at time of initial appointment.  Remaining mood symptoms was generalized anxiety without panic attacks despite being on 15 mg of BuSpar 3 times a day along with 400 mg of gabapentin 3 times per day.  Gabapentin carries its own risk of memory impairment later in life still need to continue to have a cost benefit analysis with ongoing use.  Does appear partially beneficial for chronic osteoarthritic pain which the Depakote could be assisting with as well.  May look to swap out Wellbutrin/BuSpar with Effexor or Cymbalta given that she also had hot flashes from menopause.  She was precontemplative with regard to smoking cessation.  Not interested in psychotherapy.  Follow-up in 1 month.  For safety, her acute risk factors for suicide are: Generalized anxiety disorder.  Her chronic risk factors for suicide are: Chronic mental illness, chronic pain, insomnia, access to firearms.  Her protective factors are: Employment, firearms are secured in a gun safe when not in use, no suicidal ideation, actively seeking and engaging with mental health care, medication compliance.  While future events cannot be fully predicted she does not currently meet IVC criteria and can be continued as an outpatient.  Plan:  # Generalized anxiety disorder Past medication trials: See medication trials below Status of problem: New to provider Interventions: -- Continue BuSpar 15 mg 3 times daily for now --Continue gabapentin 400 mg 2 times daily for now --Continue Wellbutrin XL 300 mg daily for now; consider discontinuing -- Obtain TSH, total T3, free T4  #  Psychologic insomnia with long-term current use of Ambien Past medication trials:  Status of problem: New to provider Interventions: -- Decrease Ambien to 7.5 mg nightly for 1 month with plan to taper further (d8/16/24).  PDMP reviewed with appropriate fill history -- Consider Remeron  versus trazodone versus expanding use of gabapentin -- Continue Depakote ER 1000 mg nightly for now -- CBT-I  # Historical diagnosis of bipolar disorder rule out major depressive disorder in remission Past medication trials:  Status of problem: New to provider Interventions: -- Depakote as above  # Tobacco use disorder with COPD Past medication trials:  Status of problem: New to provider Interventions: -- Continue to encourage abstinence --Smoking cessation counseling provided --Avoid respiratory depressants as possible, see Ambien as above  # High risk medication use: Depakote Past medication trials:  Status of problem: New to provider Interventions: -- Ordered valproic acid level, CBC, CMP  # Chronic osteoarthritic pain Past medication trials:  Status of problem: New to provider Interventions: -- Depakote, gabapentin as above  # Eating 2 meals per day rule out vitamin deficiencies Past medication trials:  Status of problem: New to provider Interventions: -- Obtain vitamin D, B12, folate, iron panel  Patient was given contact information for behavioral health clinic and was instructed to call 911 for emergencies.   Subjective:  Chief Complaint:  Chief Complaint  Patient presents with   Anxiety   Insomnia   Establish Care   Medication Refill    History of Present Illness:  Needs medications filled. Taking depakote er 1000mg  nightly, wellbutrin xl 300mg  qam, buspar 15mg  TID, gabapentin 400mg  TID, ambien 10mg  nightly. Ambien present for 6 years. Previously managed by [facility] who replaced ineffective klonopin (for sleep) with ambien. Had been diagnosed with bipolar disorder many years ago.   Lives with mother, sister, and great niece. Everyone getting along. Has puppy and older dog. Works at [employer]. Likes to babysit and still enjoys. Trouble with all phases of sleep, about 3-4hrs per night but does feel rested. Works from [employer]. Sometimes vivid dreams and  nightmares. No restless legs. No snoring. 1-2 cups of coffee in the morning. 2 meals per day with no breakfast. No binges. No restriction. No purging. Concentration adequate. No guilt feelings. Fidgety. No SI past or present.   Chronic worry across multiple domains with impact on sleep and muscle tension. No panic attacks. Does fine in crowds. Longest period of sleeplessness was 3 days without excess energy. No hyperspending. No hypersexuality. No grandiosity. Chronic project starter. No hallucinations. No paranoia.  Alcohol only special occasions and will be 1-2 margaritas at a time. Smokes 2 packs per week for 40+ years. No other drugs lifelong.    Past Psychiatric History:  Diagnoses: bipolar disorder, GAD, insomnia, tobacco use disorder Medication trials: depakote ER (effective), wellbutrin (effective), buspar (effective), ambien (effective), gabapentin (effective), klonopin (ineffective), Prozac (did not remember), Paxil (did not remember), Celexa (did not remember) Previous psychiatrist/therapist: intermittently throughout the years Hospitalizations: none Suicide attempts: none SIB: none Hx of violence towards others: none Current access to guns: sister has several guns secured in a gunsafe when not in use Hx of trauma/abuse: none  Previous Psychotropic Medications: Yes   Substance Abuse History in the last 12 months:  No.  Past Medical History:  Past Medical History:  Diagnosis Date   Bipolar disorder (HCC)    Emphysema lung (HCC)    Emphysema of lung (HCC)    Pneumonia     Past Surgical History:  Procedure Laterality Date   APPENDECTOMY     FINGER AMPUTATION Left     Family Psychiatric History: none  Family History:  Family History  Problem Relation Age of Onset   Heart disease Mother    Hyperlipidemia Father     Social History:   Academic/Vocational: works at [employer]  Social History   Socioeconomic History   Marital status: Single    Spouse name: Not on  file   Number of children: Not on file   Years of education: Not on file   Highest education level: Not on file  Occupational History   Not on file  Tobacco Use   Smoking status: Every Day    Current packs/day: 0.25    Types: Cigarettes   Smokeless tobacco: Never   Tobacco comments:    Smoking about 2 packs/week  Vaping Use   Vaping status: Never Used  Substance and Sexual Activity   Alcohol use: Yes    Comment: 1-2 margaritas on special occasions   Drug use: Never   Sexual activity: Yes    Birth control/protection: Post-menopausal  Other Topics Concern   Not on file  Social History Narrative   Not on file   Social Determinants of Health   Financial Resource Strain: Not on file  Food Insecurity: Not on file  Transportation Needs: Not on file  Physical Activity: Not on file  Stress: Not on file  Social Connections: Not on file    Additional Social History: updated  Allergies:  No Known Allergies  Current Medications: Current Outpatient Medications  Medication Sig Dispense Refill   albuterol (PROVENTIL HFA;VENTOLIN HFA) 108 (90 BASE) MCG/ACT inhaler Inhale 2 puffs into the lungs every 6 (six) hours as needed for wheezing or shortness of breath. 1 Inhaler 2   albuterol (PROVENTIL) (2.5 MG/3ML) 0.083% nebulizer solution Take 3 mLs (2.5 mg total) by nebulization every 6 (six) hours as needed for wheezing or shortness of breath. 75 mL 12   aspirin EC 325 MG tablet Take 975 mg by mouth 3 (three) times daily as needed for mild pain or moderate pain.     buPROPion (WELLBUTRIN XL) 300 MG 24 hr tablet Take 1 tablet (300 mg total) by mouth every morning. 30 tablet 2   busPIRone (BUSPAR) 15 MG tablet Take 1 tablet (15 mg total) by mouth 3 (three) times daily. 90 tablet 2   divalproex (DEPAKOTE ER) 500 MG 24 hr tablet Take 2 tablets (1,000 mg total) by mouth at bedtime. 60 tablet 2   gabapentin (NEURONTIN) 400 MG capsule Take 1 capsule (400 mg total) by mouth 3 (three) times daily.  90 capsule 2   zolpidem (AMBIEN) 5 MG tablet Take 1.5 tablets (7.5 mg total) by mouth at bedtime as needed for sleep. 30 tablet 0   No current facility-administered medications for this visit.    ROS: Review of Systems  Constitutional:  Negative for appetite change and unexpected weight change.  Gastrointestinal:  Negative for constipation, diarrhea, nausea and vomiting.  Endocrine: Positive for heat intolerance. Negative for cold intolerance and polyphagia.  Genitourinary:        Menopause with hot flashes  Musculoskeletal:  Positive for arthralgias and back pain. Negative for myalgias.  Skin:        No hair loss  Neurological:  Negative for dizziness and headaches.  Psychiatric/Behavioral:  Positive for sleep disturbance. Negative for decreased concentration, dysphoric mood, hallucinations, self-injury and suicidal ideas. The patient is nervous/anxious.     Objective:  Psychiatric Specialty Exam: Last menstrual period 05/27/2011.There is no height or weight on file to calculate BMI.  General Appearance: Casual, Fairly Groomed, and wearing glasses.  Appears stated age.  Thin  Eye Contact:  Good  Speech:  Clear and Coherent and Normal Rate  Volume:  Normal  Mood:   "Good I just need refills on my medication"  Affect:  Appropriate, Congruent, Constricted, and overall euthymic with slight anxiousness  Thought Content: Logical and Hallucinations: None   Suicidal Thoughts:  No  Homicidal Thoughts:  No  Thought Process:  Coherent, Goal Directed, and Linear  Orientation:  Full (Time, Place, and Person)    Memory: Grossly intact   Judgment:  Fair  Insight:  Fair  Concentration:  Concentration: Fair and Attention Span: Fair  Recall:  not formally assessed   Fund of Knowledge: Fair  Language: Fair  Psychomotor Activity:  Normal  Akathisia:  No  AIMS (if indicated): not done  Assets:  Manufacturing systems engineer Desire for Improvement Financial Resources/Insurance Housing Leisure  Time Physical Health Resilience Social Support Talents/Skills Transportation Vocational/Educational  ADL's:  Intact  Cognition: WNL  Sleep:  Poor   PE: General: sits comfortably in view of camera; no acute distress  Pulm: no increased work of breathing on room air  MSK: all extremity movements appear intact  Neuro: no focal neurological deficits observed  Gait & Station: unable to assess by video    Metabolic Disorder Labs: Lab Results  Component Value Date   HGBA1C 5.3 02/06/2018   MPG 105 02/06/2018   MPG 117 (H) 05/10/2015   No results found for: "PROLACTIN" No results found for: "CHOL", "TRIG", "HDL", "CHOLHDL", "VLDL", "LDLCALC" Lab Results  Component Value Date   TSH 0.274 (L) 05/10/2015    Therapeutic Level Labs: No results found for: "LITHIUM" No results found for: "CBMZ" No results found for: "VALPROATE"  Screenings:  GAD-7    Flowsheet Row Video Visit from 08/29/2021 in [facility] Video Visit from 05/27/2021 in [facility] Video Visit from 02/25/2021 in [facility] Video Visit from 11/26/2020 in [facility]  Total GAD-7 Score 2 0 3 10      PHQ2-9    Flowsheet Row Office Visit from 01/08/2023 in [facility] [place] Video Visit from 08/29/2021 in [facility] Video Visit from 05/27/2021 in [facility] Video Visit from 02/25/2021 in [facility] Video Visit from 11/26/2020 in [facility]  PHQ-2 Total Score 0 0 2 0 2  PHQ-9 Total Score 5 -- 2 -- 11      Flowsheet Row Office Visit from 01/08/2023 in [facility] [place] Video Visit from 08/29/2021 in [facility] Video Visit from 05/27/2021 in [facility]  C-SSRS RISK CATEGORY  No Risk No Risk No Risk       Collaboration of Care: Collaboration of Care: Medication Management AEB as above and Primary Care Provider AEB as above  Patient/Guardian was advised Release of Information must be obtained prior to any record release in order to collaborate their care with an outside provider. Patient/Guardian was advised if they have not already done so to contact the registration department to sign all necessary forms in order for [place] to release information regarding their care.   Consent: Patient/Guardian gives verbal consent for treatment and assignment of benefits  for services provided during this visit. Patient/Guardian expressed understanding and agreed to proceed.   Televisit via video: I connected with [name] on 01/08/23 at 11:00 AM EDT by a video enabled telemedicine application and verified that I am speaking with the correct person using two identifiers.  Location: Patient: home in [place] Provider: home office   I discussed the limitations of evaluation and management by telemedicine and the availability of in person appointments. The patient expressed understanding and agreed to proceed.  I discussed the assessment and treatment plan with the patient. The patient was provided an opportunity to ask questions and all were answered. The patient agreed with the plan and demonstrated an understanding of the instructions.   The patient was advised to call back or seek an in-person evaluation if the symptoms worsen or if the condition fails to improve as anticipated.  I provided 60 minutes dedicated to the care of this patient via video on the date of this encounter to include chart review, face-to-face time with the patient, medication management/counseling.  Elsie Lincoln, MD 8/16/202412:19 PM

## 2023-01-08 NOTE — Patient Instructions (Signed)
We decreased the Ambien to 7.5 mg nightly today.  We will plan on decreasing by 2.5 mg every month until you are no longer on this medication.  This was done as Ambien has many side effects and is not intended to be used in the long term.  Once you on a lower dose we will begin to add a replacement medication that is safer to use.  Otherwise we kept your medications the same.  When you get a chance, please come by the clinic before your next follow-up appointment with Dr. Adrian Blackwater at [address] [place], [place] and aim for an afternoon blood draw for the most accurate Depakote level.

## 2023-02-05 ENCOUNTER — Telehealth (INDEPENDENT_AMBULATORY_CARE_PROVIDER_SITE_OTHER): Payer: BC Managed Care – PPO | Admitting: Psychiatry

## 2023-02-05 ENCOUNTER — Encounter (HOSPITAL_COMMUNITY): Payer: Self-pay | Admitting: Psychiatry

## 2023-02-05 DIAGNOSIS — F17210 Nicotine dependence, cigarettes, uncomplicated: Secondary | ICD-10-CM | POA: Diagnosis not present

## 2023-02-05 DIAGNOSIS — F1320 Sedative, hypnotic or anxiolytic dependence, uncomplicated: Secondary | ICD-10-CM

## 2023-02-05 DIAGNOSIS — F411 Generalized anxiety disorder: Secondary | ICD-10-CM | POA: Diagnosis not present

## 2023-02-05 DIAGNOSIS — F5104 Psychophysiologic insomnia: Secondary | ICD-10-CM | POA: Diagnosis not present

## 2023-02-05 DIAGNOSIS — Z5181 Encounter for therapeutic drug level monitoring: Secondary | ICD-10-CM

## 2023-02-05 DIAGNOSIS — Z8659 Personal history of other mental and behavioral disorders: Secondary | ICD-10-CM

## 2023-02-05 DIAGNOSIS — F17200 Nicotine dependence, unspecified, uncomplicated: Secondary | ICD-10-CM

## 2023-02-05 DIAGNOSIS — Z79899 Other long term (current) drug therapy: Secondary | ICD-10-CM

## 2023-02-05 MED ORDER — ZOLPIDEM TARTRATE 5 MG PO TABS: 5.0000 mg | ORAL_TABLET | Freq: Every evening | ORAL | 0 refills | Status: DC | PRN

## 2023-02-05 NOTE — Progress Notes (Signed)
BH MD Outpatient Progress Note  02/05/2023 12:39 PM Holly Cortez  MRN:  [id number]  Assessment:  Holly Cortez presents for follow-up evaluation. Today, 02/05/23, patient reports actually sleeping better with lower dose of ambien and open to continue taper today. Mood has otherwise been stable with slight improvement to depression/anxiety with having more work available. She was also open to smoking cessation and provided quitline number. Not interested in psychotherapy.  Follow-up in 1 month.  For safety, her acute risk factors for suicide are: Generalized anxiety disorder.  Her chronic risk factors for suicide are: Chronic mental illness, chronic pain, insomnia, access to firearms.  Her protective factors are: Employment, firearms are secured in a gun safe when not in use, no suicidal ideation, actively seeking and engaging with mental health care, medication compliance.  While future events cannot be fully predicted she does not currently meet IVC criteria and can be continued as an outpatient.  Identifying Information: Holly Cortez is a 62 y.o. 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 Initial evaluation of medication refill for insomnia and anxiety on 01/08/2023; please see that note for full case formulation.  Patient reported medication regimen consisting of Wellbutrin XL, Depakote ER, Ambien, BuSpar, gabapentin had overall these in control of psychiatric symptoms and primarily looking for medication refill.  However and on discussion the patient only sleeps about 3 to 4 hours per night and while endorsing feeling rested had frequent vivid dreams and nightmares with racing thoughts when trying to go to sleep.  Given that she was on Ambien for 6 years  we will need to do slow taper prior to discontinuing and had direct discussion that this would not be continued in the long term as it is not intended as a chronic medication.  She did have some anxiety around this but was amenable to trial.  Did not appear that she had been on many medications previously when managed by [facility] and it is possible that Wellbutrin could be contributing to insomnia.  With the insomnia, have much lower suspicion that this is bipolar spectrum of illness given outside of difficulty sleeping for up to 3 days at a time she did not have any of the other criterion for bipolar 1 or bipolar 2 disorder with only notable exception being she is a chronic project starter even when sleeping well.  I do question that diagnosis and may be able to make change to Depakote in the future and we will try to get updated blood level along with monitoring labs.  It was more likely that she had an aggressive disorder with insomnia, which the depression appeared to be in remission at time of initial appointment.  Remaining mood symptoms was generalized anxiety without panic attacks despite being on 15 mg of BuSpar 3 times a day along with 400 mg of gabapentin 3 times per day.  Gabapentin carries its own risk of memory impairment later in life still need to continue to have a cost benefit analysis with ongoing use.  Does appear partially beneficial for chronic osteoarthritic pain which the Depakote could be assisting with as well.  May look to swap out Wellbutrin/BuSpar with Effexor or Cymbalta given that she also had hot flashes from menopause.    Plan:  # Generalized anxiety disorder Past medication trials: See medication trials  below Status of problem: Chronic and stable Interventions: -- Continue BuSpar 15 mg 3 times daily for now --Continue gabapentin 400 mg 2 times daily for now --Continue Wellbutrin XL 300 mg daily for now; consider discontinuing -- Obtain TSH, total T3, free T4  #  Psychologic insomnia with long-term current use of Ambien Past medication trials:  Status of problem: Improving Interventions: -- Decrease Ambien to 5 mg nightly for 1 month with plan to taper further (d8/16/24, d9/13/24).  PDMP reviewed with appropriate fill history -- Consider Remeron versus trazodone versus expanding use of gabapentin -- Continue Depakote ER 1000 mg nightly for now -- CBT-I  # Historical diagnosis of bipolar disorder rule out major depressive disorder in remission Past medication trials:  Status of problem: In partial remission Interventions: -- Depakote as above  # Tobacco use disorder with COPD Past medication trials:  Status of problem: Chronic and stable Interventions: -- Continue to encourage abstinence and provided quit Line --Smoking cessation counseling provided --Avoid respiratory depressants as possible, see Ambien as above  # High risk medication use: Depakote Past medication trials:  Status of problem: Chronic and stable Interventions: -- Ordered valproic acid level, CBC, CMP  # Chronic osteoarthritic pain Past medication trials:  Status of problem: Chronic and stable Interventions: -- Depakote, gabapentin as above  # Eating 2 meals per day rule out vitamin deficiencies Past medication trials:  Status of problem: Chronic and stable Interventions: -- Obtain vitamin D, B12, folate, iron panel  Patient was given contact information for behavioral health clinic and was instructed to call 911 for emergencies.   Subjective:  Chief Complaint:  Chief Complaint  Patient presents with   Anxiety   Follow-up   Insomnia    Interval History: Things have been good since last appointment. Has been working a bit more which she is happy about, getting more orders. Mood wise thinks things are going well. Sleep actually improved with less Ambien, still about 3-4hrs but feeling more rested. Amenable to further taper. Still 2 meals per day. Trying hard  to quit smoking and open to replacement therapy; 1 pack per day.   Visit Diagnosis:    ICD-10-CM   1. Long-term current use of Ambien  F13.20 zolpidem (AMBIEN) 5 MG tablet    2. Psychophysiologic insomnia  F51.04 zolpidem (AMBIEN) 5 MG tablet    3. Tobacco dependence  F17.200     4. High risk medication use: Depakote  Z79.899     5. Historical diagnosis of bipolar disorder rule out major depressive disorder in remission  Z86.59     6. 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, tobacco use disorder with COPD, arthritis of hips and knees, historical diagnosis of bipolar disorder Medication trials: depakote ER (effective), wellbutrin (effective), buspar (effective), ambien (effective), gabapentin (effective), klonopin (ineffective), Prozac (did not remember), Paxil (did not remember), Celexa (did not remember)  Previous psychiatrist/therapist: intermittently throughout the years Hospitalizations: none Suicide attempts: none SIB: none Hx of violence towards others: none Current access to guns: sister has several guns secured in a gunsafe when not in use Hx of trauma/abuse: none Substance use: None  Past Medical History:  Past Medical History:  Diagnosis Date   Bipolar disorder (HCC)    Emphysema lung (HCC)    Emphysema of lung (HCC)    Pneumonia     Past Surgical History:  Procedure Laterality Date  APPENDECTOMY     FINGER AMPUTATION Left     Family Psychiatric History: None  Family History:  Family History  Problem Relation Age of Onset   Heart disease Mother    Hyperlipidemia Father     Social History:  Academic/Vocational: works at [employer]   Social History   Socioeconomic History   Marital status: Single    Spouse name: Not on file   Number of children: Not on file   Years of education: Not on file   Highest education level:  Not on file  Occupational History   Not on file  Tobacco Use   Smoking status: Every Day    Current packs/day: 0.25    Types: Cigarettes   Smokeless tobacco: Never   Tobacco comments:    Smoking about 2 packs/week.  Open to nicotine replacement on 02/05/2023  Vaping Use   Vaping status: Never Used  Substance and Sexual Activity   Alcohol use: Yes    Comment: 1-2 margaritas on special occasions   Drug use: Never   Sexual activity: Yes    Birth control/protection: Post-menopausal  Other Topics Concern   Not on file  Social History Narrative   Not on file   Social Determinants of Health   Financial Resource Strain: Not on file  Food Insecurity: Not on file  Transportation Needs: Not on file  Physical Activity: Not on file  Stress: Not on file  Social Connections: Not on file    Allergies: No Known Allergies  Current Medications: Current Outpatient Medications  Medication Sig Dispense Refill   albuterol (PROVENTIL HFA;VENTOLIN HFA) 108 (90 BASE) MCG/ACT inhaler Inhale 2 puffs into the lungs every 6 (six) hours as needed for wheezing or shortness of breath. 1 Inhaler 2   albuterol (PROVENTIL) (2.5 MG/3ML) 0.083% nebulizer solution Take 3 mLs (2.5 mg total) by nebulization every 6 (six) hours as needed for wheezing or shortness of breath. 75 mL 12   aspirin EC 325 MG tablet Take 975 mg by mouth 3 (three) times daily as needed for mild pain or moderate pain.     buPROPion (WELLBUTRIN XL) 300 MG 24 hr tablet Take 1 tablet (300 mg total) by mouth every morning. 30 tablet 2   busPIRone (BUSPAR) 15 MG tablet Take 1 tablet (15 mg total) by mouth 3 (three) times daily. 90 tablet 2   divalproex (DEPAKOTE ER) 500 MG 24 hr tablet Take 2 tablets (1,000 mg total) by mouth at bedtime. 60 tablet 2   gabapentin (NEURONTIN) 400 MG capsule Take 1 capsule (400 mg total) by mouth 3 (three) times daily. 90 capsule 2   zolpidem (AMBIEN) 5 MG tablet Take 1 tablet (5 mg total) by mouth at bedtime as  needed for sleep. 30 tablet 0   No current facility-administered medications for this visit.    ROS: Review of Systems  Constitutional:  Negative for appetite change and unexpected weight change.  Gastrointestinal:  Negative for constipation, diarrhea, nausea and vomiting.  Endocrine: Positive for heat intolerance. Negative for cold intolerance and polyphagia.  Genitourinary:        Menopause with hot flashes  Musculoskeletal:  Positive for arthralgias and back pain.  Neurological:  Negative for dizziness and headaches.  Psychiatric/Behavioral:  Positive for sleep disturbance. Negative for decreased concentration, dysphoric mood, hallucinations, self-injury and suicidal ideas. The patient is nervous/anxious. The patient is not hyperactive.     Objective:  Psychiatric Specialty Exam: Last menstrual period 05/27/2011.There is no height or weight on file  to calculate BMI.  General Appearance: Casual, Fairly Groomed, and wearing glasses.  Appears stated age  Eye Contact:  Good  Speech:  Clear and Coherent and Normal Rate  Volume:  Normal  Mood:   "Pretty good"  Affect:  Appropriate, Congruent, Constricted, and overall euthymic  Thought Content: Logical and Hallucinations: None   Suicidal Thoughts:  No  Homicidal Thoughts:  No  Thought Process:  Coherent, Goal Directed, and Linear  Orientation:  Full (Time, Place, and Person)    Memory:  Grossly intact   Judgment:  Fair  Insight:  Fair  Concentration:  Concentration: Fair  Recall:  not formally assessed   Fund of Knowledge: Good  Language: Good  Psychomotor Activity:  Normal  Akathisia:  No  AIMS (if indicated): Not done  Assets:  Communication Skills Desire for Improvement Financial Resources/Insurance Housing Leisure Time Resilience Social Support Talents/Skills Transportation Vocational/Educational  ADL's:  Intact  Cognition: WNL  Sleep:  Poor   PE: General: sits comfortably in view of camera; no acute distress   Pulm: no increased work of breathing on room air  MSK: all extremity movements appear intact  Neuro: no focal neurological deficits observed  Gait & Station: unable to assess by video    Metabolic Disorder Labs: Lab Results  Component Value Date   HGBA1C 5.3 02/06/2018   MPG 105 02/06/2018   MPG 117 (H) 05/10/2015   No results found for: "PROLACTIN" No results found for: "CHOL", "TRIG", "HDL", "CHOLHDL", "VLDL", "LDLCALC" Lab Results  Component Value Date   TSH 0.274 (L) 05/10/2015   TSH 0.941 10/14/2012    Therapeutic Level Labs: No results found for: "LITHIUM" No results found for: "VALPROATE" No results found for: "CBMZ"  Screenings:  GAD-7    Flowsheet Row Video Visit from 08/29/2021 in [facility] Video Visit from 05/27/2021 in [facility] Video Visit from 02/25/2021 in [facility] Video Visit from 11/26/2020 in [facility]  Total GAD-7 Score 2 0 3 10      PHQ2-9    Flowsheet Row Office Visit from 01/08/2023 in [facility] [place] Video Visit from 08/29/2021 in [facility] Video Visit from 05/27/2021 in [facility] Video Visit from 02/25/2021 in [facility] Video Visit from 11/26/2020 in [facility]  PHQ-2 Total Score 0 0 2 0 2  PHQ-9 Total Score 5 -- 2 -- 11      Flowsheet Row Office Visit from 01/08/2023 in [facility] [place] Video Visit from 08/29/2021 in [facility] Video Visit from 05/27/2021 in [facility]  C-SSRS RISK CATEGORY No Risk No Risk No Risk       Collaboration of Care: Collaboration of Care: Medication Management AEB as above and Primary Care Provider AEB as above  Patient/Guardian was advised Release of  Information must be obtained prior to any record release in order to collaborate their care with an outside provider. Patient/Guardian was advised if they have not already done so to contact the registration department to sign all necessary forms in order for [place] to release information regarding their care.   Consent: Patient/Guardian gives verbal consent for treatment and assignment of benefits for services provided during this visit. Patient/Guardian expressed understanding and agreed to proceed.   Televisit via video: I connected with patient on 02/05/23 at  9:00 AM EDT by a video enabled telemedicine application and verified that I am speaking with the correct person using two identifiers.  Location: Patient: Home in [place] Provider: remote office in [place]   I discussed the limitations of evaluation and management by telemedicine and the availability of in person appointments. The patient expressed understanding and agreed to proceed.  I discussed the assessment and treatment plan with the patient. The patient was provided an opportunity to ask questions and all were answered. The patient agreed with the plan and demonstrated an understanding of the instructions.   The patient was advised to call back or seek an in-person evaluation if the symptoms worsen or if the condition fails to improve as anticipated.  I provided 15 minutes dedicated to the care of this patient via video on the date of this encounter to include chart review, face-to-face time with the patient, medication management/counseling, documentation.  Elsie Lincoln, MD 02/05/2023, 12:39 PM

## 2023-02-05 NOTE — Patient Instructions (Signed)
As we discussed here is the website that you can use for free nicotine replacement resources: http://skinner-smith.org/  The only other change we made today was decreasing the Ambien to 5 mg nightly with plan to further decrease in the future.  When you get a chance please come by the clinic to get the blood work we previously discussed.

## 2023-03-04 ENCOUNTER — Encounter (HOSPITAL_COMMUNITY): Payer: Self-pay | Admitting: Psychiatry

## 2023-03-04 ENCOUNTER — Telehealth (HOSPITAL_COMMUNITY): Payer: BC Managed Care – PPO | Admitting: Psychiatry

## 2023-03-04 DIAGNOSIS — F1320 Sedative, hypnotic or anxiolytic dependence, uncomplicated: Secondary | ICD-10-CM

## 2023-03-04 DIAGNOSIS — Z79899 Other long term (current) drug therapy: Secondary | ICD-10-CM

## 2023-03-04 DIAGNOSIS — F17210 Nicotine dependence, cigarettes, uncomplicated: Secondary | ICD-10-CM

## 2023-03-04 DIAGNOSIS — F17200 Nicotine dependence, unspecified, uncomplicated: Secondary | ICD-10-CM

## 2023-03-04 DIAGNOSIS — F329 Major depressive disorder, single episode, unspecified: Secondary | ICD-10-CM

## 2023-03-04 DIAGNOSIS — F5104 Psychophysiologic insomnia: Secondary | ICD-10-CM | POA: Diagnosis not present

## 2023-03-04 DIAGNOSIS — F411 Generalized anxiety disorder: Secondary | ICD-10-CM

## 2023-03-04 DIAGNOSIS — Z8659 Personal history of other mental and behavioral disorders: Secondary | ICD-10-CM

## 2023-03-04 DIAGNOSIS — F338 Other recurrent depressive disorders: Secondary | ICD-10-CM | POA: Insufficient documentation

## 2023-03-04 MED ORDER — ZOLPIDEM TARTRATE 5 MG PO TABS
2.5000 mg | ORAL_TABLET | Freq: Every evening | ORAL | 0 refills | Status: DC | PRN
Start: 2023-03-04 — End: 2023-04-09

## 2023-03-04 MED ORDER — MIRTAZAPINE 15 MG PO TABS
15.0000 mg | ORAL_TABLET | Freq: Every day | ORAL | 1 refills | Status: DC
Start: 2023-03-04 — End: 2023-04-09

## 2023-03-04 NOTE — Patient Instructions (Signed)
We decreased the Ambien to 2.5 mg nightly this month and we will plan to discontinue fully once you are through this prescription.  We added Remeron (mirtazapine) 15 mg nightly to your regimen which should help with some of the insomnia, appetite, and low mood.  Remember to practice the thankfulness exercise that we did in session today.  It is okay to cry and can actually be very helpful.  We you get a chance, please come by the clinic to get the blood work that we discussed.

## 2023-03-04 NOTE — Progress Notes (Signed)
BH MD Outpatient Progress Note  03/04/2023 2:44 PM Holly Cortez  MRN:  [id number]  Assessment:  Holly Cortez presents for follow-up evaluation. Today, 03/04/23, patient reports grief at having to put down her dog left 18 years with worsening insomnia, anxiety, depression secondary to that.  She was amenable to continuing with taper of Ambien as until this occurred she was actually sleeping better with lower dose of ambien.  Another stressor is that her tax and license expired so she has been unable to drive herself and having to rely on rides from her sister.  With the lower dose of Ambien will trial low dose of Remeron as her appetite also has suffered with a depressed mood down to 1 meal per day.  There is likely a seasonal component of her depression she noted significant worsening around fall and winter each year.  She was also open to smoking cessation and previously provided quitline number but she has not utilized this yet.  She also still needs to get updated blood work for monitoring of Depakote and general physical health.  Not interested in psychotherapy.  Follow-up in 1 month.  For safety, her acute risk factors for suicide are: Generalized anxiety disorder, death of her dog in February 19, 2023.  Her chronic risk factors for suicide are: Chronic mental illness, chronic pain, insomnia, access to firearms.  Her protective factors are: Employment, firearms are secured in a gun safe when not in use, no suicidal ideation, actively seeking and engaging with mental health care, medication compliance.  While future events cannot be fully predicted she does not currently meet IVC criteria and can be continued as an outpatient.  Identifying Information: Holly Cortez is a 62 y.o. 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 Initial evaluation of medication refill for insomnia and anxiety on 01/08/2023; please see that note for full case formulation.  Patient reported medication regimen consisting of Wellbutrin XL, Depakote ER, Ambien, BuSpar, gabapentin had overall these in control of psychiatric symptoms and primarily looking for medication refill.  However and on discussion the patient only sleeps about 3 to 4 hours per night and while endorsing feeling rested had frequent vivid dreams and nightmares with racing thoughts when trying to go to sleep.  Given that she was on Ambien for 6 years we will need to do slow taper prior to discontinuing and had direct discussion that this would not be continued in the long term as it is not intended as a chronic medication.  She did have some anxiety around this but was amenable to trial.  Did not appear that she had been on many medications previously when managed by [facility] and it is possible that Wellbutrin could be contributing to insomnia.  With the insomnia, have much lower suspicion that this is bipolar spectrum of illness given outside of difficulty sleeping for up to 3 days at a time she did not have any of the other criterion for bipolar 1 or bipolar 2 disorder with only notable exception being she is a chronic project starter even when sleeping well.  I do question that diagnosis and may be able to make change to Depakote in the future and we will try to get updated blood level along with monitoring labs.  It was more likely that she had an aggressive  disorder with insomnia, which the depression appeared to be in remission at time of initial appointment.  Remaining mood symptoms was generalized anxiety without panic attacks despite being on 15 mg of BuSpar 3 times a day along with 400 mg of gabapentin 3 times per day.  Gabapentin carries its own risk of memory impairment later in life still need to continue to have a  cost benefit analysis with ongoing use.  Does appear partially beneficial for chronic osteoarthritic pain which the Depakote could be assisting with as well.  May look to swap out Wellbutrin/BuSpar with Effexor or Cymbalta given that she also had hot flashes from menopause.    Plan:  # Generalized anxiety disorder Past medication trials: See medication trials below Status of problem: Chronic with moderate exacerbation Interventions: -- Continue BuSpar 15 mg 3 times daily for now --Continue gabapentin 400 mg 2 times daily for now --Continue Wellbutrin XL 300 mg daily for now; consider discontinuing --Start Remeron 15 mg nightly (s10/10/24) -- Obtain TSH, total T3, free T4  # Psychologic insomnia with long-term current use of Ambien Past medication trials:  Status of problem: Chronic with moderate exacerbation Interventions: -- Decrease Ambien to 2.5 mg nightly for 1 month with plan to discontinue after that (d8/16/24, d9/13/24, d10/10/24).  PDMP reviewed with appropriate fill history -- Remeron as above -- Continue Depakote ER 1000 mg nightly for now -- CBT-I  # Historical diagnosis of bipolar disorder rule out major depressive disorder in remission Past medication trials:  Status of problem: In partial remission Interventions: -- Depakote as above  # Tobacco use disorder with COPD Past medication trials:  Status of problem: Chronic and stable Interventions: -- Continue to encourage abstinence and provided quit Line --Smoking cessation counseling provided --Avoid respiratory depressants as possible, see Ambien as above  # High risk medication use: Depakote Past medication trials:  Status of problem: Chronic and stable Interventions: -- Ordered valproic acid level, CBC, CMP  # Chronic osteoarthritic pain Past medication trials:  Status of problem: Chronic and stable Interventions: -- Depakote, gabapentin as above  # Eating 1 meals per day rule out vitamin  deficiencies Past medication trials:  Status of problem: Chronic and stable Interventions: -- Obtain vitamin D, B12, folate, iron panel -- Remeron as above  Patient was given contact information for behavioral health clinic and was instructed to call 911 for emergencies.   Subjective:  Chief Complaint:  Chief Complaint  Patient presents with   Anxiety   Depression   Follow-up   Insomnia   grief    Interval History: Things have not been good since last appointment. Had to put her puppy down that she had for 18 years. Was her whole world and went everywhere she went. Supportive psychotherapy with thankfulness exercise with improvement to affect. Hasn't slept much at all with the emotional strain. Would be amenable to further taper of ambien with initiation of remeron. Meals down to one per day. Still trying hard to quit smoking and open to replacement therapy; 1 pack per day. Also has recognition of worsening depression in the fall and winter. Never SI.  Her license and tags have expired so is holding off on driving herself right now and relying on rides from sister.  Visit Diagnosis:    ICD-10-CM   1. Historical diagnosis of bipolar disorder rule out major depressive disorder in remission  Z86.59     2. Long-term current use of Ambien  F13.20 zolpidem (AMBIEN) 5 MG tablet  3. Psychophysiologic insomnia  F51.04 zolpidem (AMBIEN) 5 MG tablet    mirtazapine (REMERON) 15 MG tablet    4. High risk medication use: Depakote  Z79.899     5. GAD (generalized anxiety disorder)  F41.1 mirtazapine (REMERON) 15 MG tablet    6. Tobacco dependence  F17.200     7. 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, tobacco use disorder with COPD, arthritis of hips and knees, historical diagnosis of bipolar  disorder Medication trials: depakote ER (effective), wellbutrin (effective), buspar (effective), ambien (effective), gabapentin (effective), klonopin (ineffective), Prozac (did not remember), Paxil (did not remember), Celexa (did not remember)  Previous psychiatrist/therapist: intermittently throughout the years Hospitalizations: none Suicide attempts: none SIB: none Hx of violence towards others: none Current access to guns: sister has several guns secured in a gunsafe when not in use Hx of trauma/abuse: none Substance use: None  Past Medical History:  Past Medical History:  Diagnosis Date   Bipolar disorder (HCC)    Emphysema lung (HCC)    Emphysema of lung (HCC)    Pneumonia     Past Surgical History:  Procedure Laterality Date   APPENDECTOMY     FINGER AMPUTATION Left     Family Psychiatric History: None  Family History:  Family History  Problem Relation Age of Onset   Heart disease Mother    Hyperlipidemia Father     Social History:  Academic/Vocational: works at [employer]   Social History   Socioeconomic History   Marital status: Single    Spouse name: Not on file   Number of children: Not on file   Years of education: Not on file   Highest education level: Not on file  Occupational History   Not on file  Tobacco Use   Smoking status: Every Day    Current packs/day: 0.25    Types: Cigarettes   Smokeless tobacco: Never   Tobacco comments:    Smoking about 2 packs/week.  Open to nicotine replacement on 02/05/2023  Vaping Use   Vaping status: Never Used  Substance and Sexual Activity   Alcohol use: Yes    Comment: 1-2 margaritas on special occasions   Drug use: Never   Sexual activity: Yes    Birth control/protection: Post-menopausal  Other Topics Concern   Not on file  Social History Narrative   Not on file   Social Determinants of Health   Financial Resource Strain: Not on file  Food Insecurity: Not on file  Transportation Needs: Not on file   Physical Activity: Not on file  Stress: Not on file  Social Connections: Not on file    Allergies: No Known Allergies  Current Medications: Current Outpatient Medications  Medication Sig Dispense Refill   mirtazapine (REMERON) 15 MG tablet Take 1 tablet (15 mg total) by mouth at bedtime. 30 tablet 1   albuterol (PROVENTIL HFA;VENTOLIN HFA) 108 (90 BASE) MCG/ACT inhaler Inhale 2 puffs into the lungs every 6 (six) hours as needed for wheezing or shortness of breath. 1 Inhaler 2   albuterol (PROVENTIL) (2.5 MG/3ML) 0.083% nebulizer solution Take 3 mLs (2.5 mg total) by nebulization every 6 (six) hours as needed for wheezing or shortness of breath. 75 mL 12   aspirin EC 325 MG tablet Take 975 mg by mouth 3 (three) times daily as needed for mild pain or moderate  pain.     buPROPion (WELLBUTRIN XL) 300 MG 24 hr tablet Take 1 tablet (300 mg total) by mouth every morning. 30 tablet 2   busPIRone (BUSPAR) 15 MG tablet Take 1 tablet (15 mg total) by mouth 3 (three) times daily. 90 tablet 2   divalproex (DEPAKOTE ER) 500 MG 24 hr tablet Take 2 tablets (1,000 mg total) by mouth at bedtime. 60 tablet 2   gabapentin (NEURONTIN) 400 MG capsule Take 1 capsule (400 mg total) by mouth 3 (three) times daily. 90 capsule 2   zolpidem (AMBIEN) 5 MG tablet Take 0.5 tablets (2.5 mg total) by mouth at bedtime as needed for sleep. 15 tablet 0   No current facility-administered medications for this visit.    ROS: Review of Systems  Constitutional:  Positive for appetite change. Negative for unexpected weight change.  Gastrointestinal:  Negative for constipation, diarrhea, nausea and vomiting.  Endocrine: Positive for heat intolerance. Negative for cold intolerance and polyphagia.  Genitourinary:        Menopause with hot flashes  Musculoskeletal:  Positive for arthralgias and back pain.  Neurological:  Negative for dizziness and headaches.  Psychiatric/Behavioral:  Positive for dysphoric mood and sleep  disturbance. Negative for decreased concentration, hallucinations, self-injury and suicidal ideas. The patient is nervous/anxious. The patient is not hyperactive.     Objective:  Psychiatric Specialty Exam: Last menstrual period 05/27/2011.There is no height or weight on file to calculate BMI.  General Appearance: Casual, Fairly Groomed, and wearing glasses.  Appears stated age  Eye Contact:  Good  Speech:  Clear and Coherent and Normal Rate  Volume:  Normal  Mood:   "Not so good"  Affect:  Appropriate, Congruent, Constricted, Tearful, and depressed but able to calm  Thought Content: Logical and Hallucinations: None   Suicidal Thoughts:  No  Homicidal Thoughts:  No  Thought Process:  Coherent, Goal Directed, and Linear  Orientation:  Full (Time, Place, and Person)    Memory:  Grossly intact   Judgment:  Fair  Insight:  Fair  Concentration:  Concentration: Fair  Recall:  not formally assessed   Fund of Knowledge: Good  Language: Good  Psychomotor Activity:  Normal  Akathisia:  No  AIMS (if indicated): Not done  Assets:  Communication Skills Desire for Improvement Financial Resources/Insurance Housing Leisure Time Resilience Social Support Talents/Skills Transportation Vocational/Educational  ADL's:  Intact  Cognition: WNL  Sleep:  Poor   PE: General: sits comfortably in view of camera; no acute distress  Pulm: no increased work of breathing on room air  MSK: all extremity movements appear intact  Neuro: no focal neurological deficits observed  Gait & Station: unable to assess by video    Metabolic Disorder Labs: Lab Results  Component Value Date   HGBA1C 5.3 02/06/2018   MPG 105 02/06/2018   MPG 117 (H) 05/10/2015   No results found for: "PROLACTIN" No results found for: "CHOL", "TRIG", "HDL", "CHOLHDL", "VLDL", "LDLCALC" Lab Results  Component Value Date   TSH 0.274 (L) 05/10/2015   TSH 0.941 10/14/2012    Therapeutic Level Labs: No results found for:  "LITHIUM" No results found for: "VALPROATE" No results found for: "CBMZ"  Screenings:  GAD-7    Flowsheet Row Video Visit from 08/29/2021 in [facility] Video Visit from 05/27/2021 in [facility] Video Visit from 02/25/2021 in [facility] Video Visit from 11/26/2020 in [facility]  Total GAD-7 Score 2  0 3 10      PHQ2-9    Flowsheet Row Office Visit from 01/08/2023 in [facility] [place] Video Visit from 08/29/2021 in [facility] Video Visit from 05/27/2021 in [facility] Video Visit from 02/25/2021 in [facility] Visit from 11/26/2020 in [facility]  PHQ-2 Total Score 0 0 2 0 2  PHQ-9 Total Score 5 -- 2 -- 11      Flowsheet Row Office Visit from 01/08/2023 in [facility] [place] Video Visit from 08/29/2021 in [facility] Video Visit from 05/27/2021 in [facility]  C-SSRS RISK CATEGORY No Risk No Risk No Risk       Collaboration of Care: Collaboration of Care: Medication Management AEB as above and Primary Care Provider AEB as above  Patient/Guardian was advised Release of Information must be obtained prior to any record release in order to collaborate their care with an outside provider. Patient/Guardian was advised if they have not already done so to contact the registration department to sign all necessary forms in order for [place] to release information regarding their care.   Consent: Patient/Guardian gives verbal consent for treatment and assignment of benefits for services provided during this visit. Patient/Guardian expressed understanding and agreed to proceed.   Televisit via video: I connected with patient on 03/04/23 at  8:00 AM EDT by  a video enabled telemedicine application and verified that I am speaking with the correct person using two identifiers.  Location: Patient: Home in [place] Provider: remote office in [place]   I discussed the limitations of evaluation and management by telemedicine and the availability of in person appointments. The patient expressed understanding and agreed to proceed.  I discussed the assessment and treatment plan with the patient. The patient was provided an opportunity to ask questions and all were answered. The patient agreed with the plan and demonstrated an understanding of the instructions.   The patient was advised to call back or seek an in-person evaluation if the symptoms worsen or if the condition fails to improve as anticipated.  I provided 30 minutes dedicated to the care of this patient via video on the date of this encounter to include chart review, face-to-face time with the patient, medication management/counseling, documentation.  Elsie Lincoln, MD 03/04/2023, 2:44 PM

## 2023-04-01 ENCOUNTER — Telehealth (HOSPITAL_COMMUNITY): Payer: BC Managed Care – PPO | Admitting: Psychiatry

## 2023-04-09 ENCOUNTER — Encounter (HOSPITAL_COMMUNITY): Payer: Self-pay | Admitting: Psychiatry

## 2023-04-09 ENCOUNTER — Other Ambulatory Visit (HOSPITAL_BASED_OUTPATIENT_CLINIC_OR_DEPARTMENT_OTHER): Payer: Self-pay

## 2023-04-09 ENCOUNTER — Telehealth (INDEPENDENT_AMBULATORY_CARE_PROVIDER_SITE_OTHER): Payer: BC Managed Care – PPO | Admitting: Psychiatry

## 2023-04-09 DIAGNOSIS — Z8659 Personal history of other mental and behavioral disorders: Secondary | ICD-10-CM | POA: Diagnosis not present

## 2023-04-09 DIAGNOSIS — F5104 Psychophysiologic insomnia: Secondary | ICD-10-CM | POA: Diagnosis not present

## 2023-04-09 DIAGNOSIS — F17200 Nicotine dependence, unspecified, uncomplicated: Secondary | ICD-10-CM

## 2023-04-09 DIAGNOSIS — Z758 Other problems related to medical facilities and other health care: Secondary | ICD-10-CM | POA: Insufficient documentation

## 2023-04-09 DIAGNOSIS — F411 Generalized anxiety disorder: Secondary | ICD-10-CM | POA: Diagnosis not present

## 2023-04-09 DIAGNOSIS — F338 Other recurrent depressive disorders: Secondary | ICD-10-CM

## 2023-04-09 DIAGNOSIS — Z79899 Other long term (current) drug therapy: Secondary | ICD-10-CM

## 2023-04-09 MED ORDER — GABAPENTIN 400 MG PO CAPS
400.0000 mg | ORAL_CAPSULE | Freq: Three times a day (TID) | ORAL | 2 refills | Status: DC
  Filled 2023-04-09: qty 90, 30d supply, fill #0
  Filled 2023-05-18: qty 90, 30d supply, fill #1

## 2023-04-09 MED ORDER — BUSPIRONE HCL 15 MG PO TABS
15.0000 mg | ORAL_TABLET | Freq: Three times a day (TID) | ORAL | 2 refills | Status: DC
  Filled 2023-04-09: qty 90, 30d supply, fill #0
  Filled 2023-05-18: qty 90, 30d supply, fill #1

## 2023-04-09 MED ORDER — MIRTAZAPINE 30 MG PO TABS
30.0000 mg | ORAL_TABLET | Freq: Every day | ORAL | 2 refills | Status: DC
  Filled 2023-04-09: qty 30, 30d supply, fill #0
  Filled 2023-05-18: qty 30, 30d supply, fill #1

## 2023-04-09 MED ORDER — DIVALPROEX SODIUM ER 500 MG PO TB24
1000.0000 mg | ORAL_TABLET | Freq: Every day | ORAL | 2 refills | Status: DC
  Filled 2023-04-09: qty 60, 30d supply, fill #0
  Filled 2023-05-18: qty 60, 30d supply, fill #1

## 2023-04-09 NOTE — Progress Notes (Signed)
BH MD Outpatient Progress Note  04/09/2023 10:23 AM Holly Cortez  MRN:  [id number]  Assessment:  Holly Cortez presents for follow-up evaluation. Today, 04/09/23, patient reports ongoing grief at having to put down her dog left 18 years but has improved with the passage of time.  At this point has more or less been able to come off of the Ambien and likes the benefits of Remeron with improving appetite and no sleep hangover.  Did come down with pneumonia leading to missing last appointment and still has not gotten blood work but found that she also does not have a primary care provider and provided her with referral today.  Improvement in appetite could also be coming from running out of Wellbutrin for the last 2-1/2 weeks and we will plan on not returning to that medication for now as it was also not helping with smoking cessation.  We will titrate Remeron instead.  Was able to get tags on her car again. There is likely a seasonal component of her depression she noted significant worsening around fall and winter each year so there may yet be use for Wellbutrin but at a lower dose; similarly noticing worsening hot flashes every time she does not take Wellbutrin but if wanting to consider switching to Effexor in the future would likely try to come off of BuSpar first.  She was also open to smoking cessation and previously provided quitline number but she has not utilized this yet.  She also still needs to get updated blood work for monitoring of Depakote and general physical health.  Not interested in psychotherapy.  Follow-up in 1 month.  For safety, her acute risk factors for suicide are: Generalized anxiety disorder, death of her dog in 03/04/23.  Her chronic risk factors for suicide are: Chronic mental illness, chronic pain, insomnia, access to firearms.  Her protective factors are: Employment, firearms are secured in a gun safe when not in use, no suicidal ideation, actively seeking and  engaging with mental health care, medication compliance.  While future events cannot be fully predicted she does not currently meet IVC criteria and can be continued as an outpatient.  Identifying Information: Holly Cortez is a 62 y.o. 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 Initial evaluation of medication refill for insomnia and anxiety on 01/08/2023; please see that note for full case formulation.  Gabapentin carries its own risk of memory impairment later in life still need to continue to have a cost benefit analysis with ongoing use.  Does appear partially beneficial for chronic osteoarthritic pain which the Depakote could be assisting with as well.  May look to swap out Wellbutrin/BuSpar with Effexor or Cymbalta given that she also had hot flashes from menopause.    Plan:  # Generalized anxiety disorder Past medication trials: See medication trials below Status of problem: Improving Interventions: -- Continue BuSpar 15 mg 3 times daily for now --Continue gabapentin 400 mg 2 times daily for now --discontinue Wellbutrin XL 300 mg daily for now; consider discontinuing --Titrate Remeron to 30 mg nightly (s10/10/24) -- Obtain TSH, total T3, free T4  # Psychologic insomnia with long-term current use of Ambien Past medication trials:  Status of problem: Chronic and stable Interventions: -- Discontinue Ambien -- Remeron as above -- Continue Depakote ER 1000 mg nightly  for now -- CBT-I  # Historical diagnosis of bipolar disorder rule out major depressive disorder in remission Past medication trials:  Status of problem: In partial remission Interventions: -- Depakote as above  # Tobacco use disorder with COPD  patient does not have primary care  provider Past medication trials:  Status of problem: Chronic and stable Interventions: -- Continue to encourage abstinence and provided quit Line --Smoking cessation counseling provided --Avoid respiratory depressants as possible -- Referral to family medicine  # High risk medication use: Depakote Past medication trials:  Status of problem: Chronic and stable Interventions: -- Ordered valproic acid level, CBC, CMP  # Chronic osteoarthritic pain Past medication trials:  Status of problem: Chronic and stable Interventions: -- Depakote, gabapentin as above  # Eating now around 2 meals per day rule out vitamin deficiencies Past medication trials:  Status of problem: Improving Interventions: -- Obtain vitamin D, B12, folate, iron panel -- Remeron as above -- Referral to family medicine  Patient was given contact information for behavioral health clinic and was instructed to call 911 for emergencies.   Subjective:  Chief Complaint:  Chief Complaint  Patient presents with   Anxiety   Depression   Insomnia   Follow-up    Interval History: Ended up getting sick which is why missed last appointment with pneumonia but mostly on the mend now. Mood is a little better, sadness less pronounced. Doing with loss of puppy. With the illness, still hasn't gotten blood work. Sleep still here and there. Is nearly off the [place] but finding the remeron works well and not leaving her with sleep hangover. Appetite has improved with eating snacks through the morning and having lunch at 9a and then eating whatever she can find when getting home from work. Pharmacy didn't refill the wellbutrin last time so has been off for 2.5 weeks and noticing worsening hot flashes but have calmed down. Had originally been put on for assistance with stopping smoking but never worked for that. Still trying hard to quit smoking and open to replacement therapy; 1 pack per day. Blood pressure typically runs low. Also has  recognition of worsening depression in the fall and winter. Never SI.  Takes aspirin 325mg  daily prn and may take goody powder as needed.  Visit Diagnosis:    ICD-10-CM   1. Major depressive disorder with seasonal component  F33.8 mirtazapine (REMERON) 30 MG tablet    2. GAD (generalized anxiety disorder)  F41.1 busPIRone (BUSPAR) 15 MG tablet    gabapentin (NEURONTIN) 400 MG capsule    mirtazapine (REMERON) 30 MG tablet    3. History of bipolar disorder  Z86.59 divalproex (DEPAKOTE ER) 500 MG 24 hr tablet    4. Psychophysiologic insomnia  F51.04 gabapentin (NEURONTIN) 400 MG capsule    mirtazapine (REMERON) 30 MG tablet    5. High risk medication use: Depakote  Z79.899     6. Tobacco dependence  F17.200     7. 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, tobacco use disorder with COPD, arthritis of hips and knees, historical diagnosis of bipolar disorder Medication trials: depakote ER (effective), wellbutrin (effective but not for smoking cessation), buspar (effective), ambien (effective), gabapentin (effective), klonopin (ineffective), Prozac (did not remember), Paxil (did not remember), Celexa (did not remember)  Previous psychiatrist/therapist: intermittently throughout the years Hospitalizations: none Suicide attempts:  none SIB: none Hx of violence towards others: none Current access to guns: sister has several guns secured in a gunsafe when not in use Hx of trauma/abuse: none Substance use: None  Past Medical History:  Past Medical History:  Diagnosis Date   Bipolar disorder (HCC)    Emphysema lung (HCC)    Emphysema of lung (HCC)    Long-term current use of Ambien 01/08/2023   Pneumonia     Past Surgical History:  Procedure Laterality Date   APPENDECTOMY     FINGER AMPUTATION Left     Family  Psychiatric History: None  Family History:  Family History  Problem Relation Age of Onset   Heart disease Mother    Hyperlipidemia Father     Social History:  Academic/Vocational: works at [employer]   Social History   Socioeconomic History   Marital status: Single    Spouse name: Not on file   Number of children: Not on file   Years of education: Not on file   Highest education level: Not on file  Occupational History   Not on file  Tobacco Use   Smoking status: Every Day    Current packs/day: 0.25    Types: Cigarettes   Smokeless tobacco: Never   Tobacco comments:    Smoking about 2 packs/week.  Open to nicotine replacement on 02/05/2023  Vaping Use   Vaping status: Never Used  Substance and Sexual Activity   Alcohol use: Yes    Comment: 1-2 margaritas on special occasions   Drug use: Never   Sexual activity: Yes    Birth control/protection: Post-menopausal  Other Topics Concern   Not on file  Social History Narrative   Not on file   Social Determinants of Health   Financial Resource Strain: Not on file  Food Insecurity: Not on file  Transportation Needs: Not on file  Physical Activity: Not on file  Stress: Not on file  Social Connections: Not on file    Allergies: No Known Allergies  Current Medications: Current Outpatient Medications  Medication Sig Dispense Refill   albuterol (PROVENTIL HFA;VENTOLIN HFA) 108 (90 BASE) MCG/ACT inhaler Inhale 2 puffs into the lungs every 6 (six) hours as needed for wheezing or shortness of breath. 1 Inhaler 2   albuterol (PROVENTIL) (2.5 MG/3ML) 0.083% nebulizer solution Take 3 mLs (2.5 mg total) by nebulization every 6 (six) hours as needed for wheezing or shortness of breath. 75 mL 12   aspirin EC 325 MG tablet Take 325 mg by mouth daily as needed for mild pain (pain score 1-3) or moderate pain (pain score 4-6).     busPIRone (BUSPAR) 15 MG tablet Take 1 tablet (15 mg total) by mouth 3 (three) times daily. 90 tablet 2    divalproex (DEPAKOTE ER) 500 MG 24 hr tablet Take 2 tablets (1,000 mg total) by mouth at bedtime. 60 tablet 2   gabapentin (NEURONTIN) 400 MG capsule Take 1 capsule (400 mg total) by mouth 3 (three) times daily. 90 capsule 2   mirtazapine (REMERON) 30 MG tablet Take 1 tablet (30 mg total) by mouth at bedtime. 30 tablet 2   No current facility-administered medications for this visit.    ROS: Review of Systems  Constitutional:  Positive for appetite change. Negative for unexpected weight change.  Respiratory:  Positive for cough.   Gastrointestinal:  Negative for constipation, diarrhea, nausea and vomiting.  Endocrine: Positive for heat intolerance. Negative for cold intolerance and polyphagia.  Genitourinary:  Menopause with hot flashes  Musculoskeletal:  Positive for arthralgias and back pain.  Neurological:  Negative for dizziness and headaches.  Psychiatric/Behavioral:  Positive for dysphoric mood and sleep disturbance. Negative for decreased concentration, hallucinations, self-injury and suicidal ideas. The patient is nervous/anxious. The patient is not hyperactive.     Objective:  Psychiatric Specialty Exam: Last menstrual period 05/27/2011.There is no height or weight on file to calculate BMI.  General Appearance: Casual, Fairly Groomed, and wearing glasses.  Appears stated age  Eye Contact:  Good  Speech:  Clear and Coherent and Normal Rate  Volume:  Normal  Mood:   "Doing better, I got pneumonia"  Affect:  Appropriate, Congruent, Full Range, and depressed but able to laugh.  Anxious  Thought Content: Logical and Hallucinations: None   Suicidal Thoughts:  No  Homicidal Thoughts:  No  Thought Process:  Coherent, Goal Directed, and Linear  Orientation:  Full (Time, Place, and Person)    Memory:  Grossly intact   Judgment:  Fair  Insight:  Fair  Concentration:  Concentration: Fair  Recall:  not formally assessed   Fund of Knowledge: Good  Language: Good   Psychomotor Activity:  Normal  Akathisia:  No  AIMS (if indicated): Not done  Assets:  Communication Skills Desire for Improvement Financial Resources/Insurance Housing Leisure Time Resilience Social Support Talents/Skills Transportation Vocational/Educational  ADL's:  Intact  Cognition: WNL  Sleep:  Poor   PE: General: sits comfortably in view of camera; no acute distress  Pulm: no increased work of breathing on room air  MSK: all extremity movements appear intact  Neuro: no focal neurological deficits observed  Gait & Station: unable to assess by video    Metabolic Disorder Labs: Lab Results  Component Value Date   HGBA1C 5.3 02/06/2018   MPG 105 02/06/2018   MPG 117 (H) 05/10/2015   No results found for: "PROLACTIN" No results found for: "CHOL", "TRIG", "HDL", "CHOLHDL", "VLDL", "LDLCALC" Lab Results  Component Value Date   TSH 0.274 (L) 05/10/2015   TSH 0.941 10/14/2012    Therapeutic Level Labs: No results found for: "LITHIUM" No results found for: "VALPROATE" No results found for: "CBMZ"  Screenings:  GAD-7    Flowsheet Row Video Visit from 08/29/2021 in [facility] Video Visit from 05/27/2021 in [facility] Video Visit from 02/25/2021 in [facility] Video Visit from 11/26/2020 in [facility]  Total GAD-7 Score 2 0 3 10      PHQ2-9    Flowsheet Row Office Visit from 01/08/2023 in [facility] [place] Video Visit from 08/29/2021 in [facility] Video Visit from 05/27/2021 in [facility] Video Visit from 02/25/2021 in [facility] Video Visit from 11/26/2020 in [facility]  PHQ-2 Total Score 0 0 2 0 2  PHQ-9 Total Score 5 -- 2 -- 11      Flowsheet Row Office Visit from 01/08/2023 in [facility] [place] Video Visit from 08/29/2021 in [facility] Video Visit from 05/27/2021 in [facility]  C-SSRS RISK CATEGORY No Risk No Risk No Risk       Collaboration of Care: Collaboration of Care: Medication Management AEB as above and Primary Care Provider AEB as above  Patient/Guardian was advised Release of Information must be obtained prior to any record release  in order to collaborate their care with an outside provider. Patient/Guardian was advised if they have not already done so to contact the registration department to sign all necessary forms in order for [place] to release information regarding their care.   Consent: Patient/Guardian gives verbal consent for treatment and assignment of benefits for services provided during this visit. Patient/Guardian expressed understanding and agreed to proceed.   Televisit via video: I connected with patient on 04/09/23 at 10:00 AM EST by a video enabled telemedicine application and verified that I am speaking with the correct person using two identifiers.  Location: Patient: Home in [place] Provider: remote office in [place]   I discussed the limitations of evaluation and management by telemedicine and the availability of in person appointments. The patient expressed understanding and agreed to proceed.  I discussed the assessment and treatment plan with the patient. The patient was provided an opportunity to ask questions and all were answered. The patient agreed with the plan and demonstrated an understanding of the instructions.   The patient was advised to call back or seek an in-person evaluation if the symptoms worsen or if the condition fails to improve as anticipated.  I provided 30 minutes dedicated to the care of this patient via video on the date of this encounter to include chart review, face-to-face time with the patient, medication management/counseling,  documentation, coordination of care with primary care provider.  Elsie Lincoln, MD 04/09/2023, 10:23 AM

## 2023-04-27 DIAGNOSIS — J029 Acute pharyngitis, unspecified: Secondary | ICD-10-CM | POA: Diagnosis not present

## 2023-04-27 DIAGNOSIS — Z03818 Encounter for observation for suspected exposure to other biological agents ruled out: Secondary | ICD-10-CM | POA: Diagnosis not present

## 2023-04-27 DIAGNOSIS — H9202 Otalgia, left ear: Secondary | ICD-10-CM | POA: Diagnosis not present

## 2023-04-27 DIAGNOSIS — J209 Acute bronchitis, unspecified: Secondary | ICD-10-CM | POA: Diagnosis not present

## 2023-05-10 ENCOUNTER — Telehealth (HOSPITAL_COMMUNITY): Payer: BC Managed Care – PPO | Admitting: Psychiatry

## 2023-05-12 ENCOUNTER — Telehealth (HOSPITAL_COMMUNITY): Payer: Self-pay | Admitting: *Deleted

## 2023-05-12 NOTE — Telephone Encounter (Signed)
Patient called stating she is almost out of her medication and would like to know what to do. Staff called number patient provided on voicemail and in chart and was not able to reach her. Staff left message for patient to call office back to schedule an appt. Office number was provided on voicemail.

## 2023-05-18 ENCOUNTER — Other Ambulatory Visit (HOSPITAL_BASED_OUTPATIENT_CLINIC_OR_DEPARTMENT_OTHER): Payer: Self-pay

## 2023-05-18 ENCOUNTER — Telehealth (HOSPITAL_COMMUNITY): Payer: Self-pay | Admitting: *Deleted

## 2023-05-18 DIAGNOSIS — F338 Other recurrent depressive disorders: Secondary | ICD-10-CM

## 2023-05-18 DIAGNOSIS — Z8659 Personal history of other mental and behavioral disorders: Secondary | ICD-10-CM

## 2023-05-18 DIAGNOSIS — F411 Generalized anxiety disorder: Secondary | ICD-10-CM

## 2023-05-18 DIAGNOSIS — F5104 Psychophysiologic insomnia: Secondary | ICD-10-CM

## 2023-05-18 MED ORDER — MIRTAZAPINE 30 MG PO TABS
30.0000 mg | ORAL_TABLET | Freq: Every day | ORAL | 0 refills | Status: DC
  Filled 2023-05-18: qty 5, 5d supply, fill #0

## 2023-05-18 MED ORDER — BUSPIRONE HCL 15 MG PO TABS
15.0000 mg | ORAL_TABLET | Freq: Three times a day (TID) | ORAL | 0 refills | Status: DC
  Filled 2023-05-18: qty 15, 5d supply, fill #0

## 2023-05-18 MED ORDER — GABAPENTIN 400 MG PO CAPS
400.0000 mg | ORAL_CAPSULE | Freq: Three times a day (TID) | ORAL | 0 refills | Status: DC
  Filled 2023-05-18: qty 15, 5d supply, fill #0

## 2023-05-18 MED ORDER — DIVALPROEX SODIUM ER 500 MG PO TB24
1000.0000 mg | ORAL_TABLET | Freq: Every day | ORAL | 0 refills | Status: DC
  Filled 2023-05-18: qty 10, 5d supply, fill #0

## 2023-05-18 NOTE — Telephone Encounter (Signed)
Patient called and [facility] stating she is out of medication and would like to know what to do.   Staff called patient to get more information as to which medication she's needing refills for patient to also schedule f/u appt for her due to her no showing the past 2 appointment with provider. Staff left office number on voicemail for patient to call office back.

## 2023-05-18 NOTE — Addendum Note (Signed)
Addended by: Tia Masker on: 05/18/2023 12:02 PM   Modules accepted: Orders

## 2023-05-20 ENCOUNTER — Other Ambulatory Visit (HOSPITAL_BASED_OUTPATIENT_CLINIC_OR_DEPARTMENT_OTHER): Payer: Self-pay

## 2023-05-20 ENCOUNTER — Telehealth (INDEPENDENT_AMBULATORY_CARE_PROVIDER_SITE_OTHER): Payer: BC Managed Care – PPO | Admitting: Psychiatry

## 2023-05-20 ENCOUNTER — Encounter (HOSPITAL_COMMUNITY): Payer: Self-pay | Admitting: Psychiatry

## 2023-05-20 DIAGNOSIS — F338 Other recurrent depressive disorders: Secondary | ICD-10-CM

## 2023-05-20 DIAGNOSIS — F5104 Psychophysiologic insomnia: Secondary | ICD-10-CM

## 2023-05-20 DIAGNOSIS — F411 Generalized anxiety disorder: Secondary | ICD-10-CM

## 2023-05-20 DIAGNOSIS — Z8659 Personal history of other mental and behavioral disorders: Secondary | ICD-10-CM

## 2023-05-20 DIAGNOSIS — F17200 Nicotine dependence, unspecified, uncomplicated: Secondary | ICD-10-CM | POA: Diagnosis not present

## 2023-05-20 DIAGNOSIS — Z79899 Other long term (current) drug therapy: Secondary | ICD-10-CM

## 2023-05-20 DIAGNOSIS — Z758 Other problems related to medical facilities and other health care: Secondary | ICD-10-CM

## 2023-05-20 MED ORDER — GABAPENTIN 400 MG PO CAPS
400.0000 mg | ORAL_CAPSULE | Freq: Three times a day (TID) | ORAL | 2 refills | Status: DC
  Filled 2023-05-20 – 2023-05-24 (×2): qty 90, 30d supply, fill #0
  Filled 2023-07-08: qty 90, 30d supply, fill #1
  Filled 2023-08-16: qty 90, 30d supply, fill #2

## 2023-05-20 MED ORDER — BUSPIRONE HCL 15 MG PO TABS
15.0000 mg | ORAL_TABLET | Freq: Three times a day (TID) | ORAL | 2 refills | Status: AC
  Filled 2023-05-20 – 2023-05-24 (×2): qty 90, 30d supply, fill #0
  Filled 2023-07-08: qty 90, 30d supply, fill #1

## 2023-05-20 MED ORDER — DIVALPROEX SODIUM ER 500 MG PO TB24
1000.0000 mg | ORAL_TABLET | Freq: Every day | ORAL | 2 refills | Status: DC
  Filled 2023-05-20 – 2023-05-24 (×2): qty 60, 30d supply, fill #0
  Filled 2023-07-08: qty 60, 30d supply, fill #1
  Filled 2023-08-16: qty 60, 30d supply, fill #2

## 2023-05-20 MED ORDER — MIRTAZAPINE 45 MG PO TABS
45.0000 mg | ORAL_TABLET | Freq: Every day | ORAL | 2 refills | Status: DC
  Filled 2023-05-20: qty 30, 30d supply, fill #0
  Filled 2023-07-08: qty 30, 30d supply, fill #1

## 2023-05-20 NOTE — Patient Instructions (Signed)
We increased the Remeron to 45 mg nightly today.  We otherwise kept your other medications the same.  When you get a chance please come by the clinic to get your blood work drawn so we can monitor how the Depakote is working in your body and rule out other causes of how you have been feeling.  We also had previously placed a referral to primary care if you can please establish with them as well.  Keep up the good work with cutting back on cigarettes.

## 2023-05-20 NOTE — Progress Notes (Signed)
BH MD Outpatient Progress Note  05/20/2023 12:09 PM Holly Cortez  MRN:  [id number]  Assessment:  Holly Cortez presents for follow-up evaluation. Today, 05/20/23, patient reports stress which is typical for her at this time of year with the winter season and is beginning to have less mood lability with getting back on her medications after running out after missing her last 2 appointments.  Would be amenable to titration of Remeron.  Still has not gotten blood work but found that she also does not have a primary care provider and has not established with one after getting her referral.   Noticing worsening hot flashes every time she does not take Wellbutrin but if wanting to consider switching to Effexor in the future would likely try to come off of BuSpar first.  She was also open to smoking cessation and previously provided quitline number but she has not utilized this yet but did manage to decrease the number of cigarettes per day significantly.  She also still needs to get updated blood work for monitoring of Depakote and general physical health.  Not interested in psychotherapy.  Follow-up in 3 months.  For safety, her acute risk factors for suicide are: Generalized anxiety disorder, death of her dog in Mar 12, 2023.  Her chronic risk factors for suicide are: Chronic mental illness, chronic pain, insomnia, access to firearms.  Her protective factors are: Employment, firearms are secured in a gun safe when not in use, no suicidal ideation, actively seeking and engaging with mental health care, medication compliance.  While future events cannot be fully predicted she does not currently meet IVC criteria and can be continued as an outpatient.  Identifying Information: Holly Cortez is a 62 y.o. 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 Initial evaluation of medication refill for insomnia and anxiety on 01/08/2023; please see that note for full case formulation.  Gabapentin carries its own risk of memory impairment later in life still need to continue to have a cost benefit analysis with ongoing use.  Does appear partially beneficial for chronic osteoarthritic pain which the Depakote could be assisting with as well.  May look to swap out Wellbutrin/BuSpar with Effexor or Cymbalta given that she also had hot flashes from menopause.  Improvement in appetite could also be coming from running out of Wellbutrin and we will plan on not returning to that medication for now as it was also not helping with smoking cessation.   Plan:  # Generalized anxiety disorder Past medication trials: See medication trials below Status of problem: Chronic with moderate exacerbation Interventions: -- Continue BuSpar 15 mg 3 times daily for now --Continue gabapentin 400 mg 2 times daily for now --Titrate Remeron to 45 mg nightly (s10/10/24, i12/26/24) -- Obtain TSH, total T3, free T4  # Psychologic insomnia with long-term current use of Ambien Past medication trials:  Status of problem: Chronic with mild exacerbation Interventions: -- Discontinue Ambien -- Remeron as above -- Continue Depakote ER 1000 mg nightly for now -- CBT-I  # Historical diagnosis of bipolar disorder rule out major depressive disorder in remission Past medication trials:  Status of problem: In partial remission Interventions: -- Depakote as above  # Tobacco use disorder with COPD  patient does not have primary care provider Past medication trials:  Status of problem: Improving  Interventions: -- Continue to encourage abstinence and provided quit Line --Smoking cessation counseling provided --Avoid respiratory depressants as possible -- Referral to family medicine  # High risk  medication use: Depakote Past medication trials:  Status of problem: Chronic and stable Interventions: -- Ordered valproic acid level, CBC, CMP  # Chronic osteoarthritic pain Past medication trials:  Status of problem: Chronic and stable Interventions: -- Depakote, gabapentin as above  # Eating now around 2 meals per day rule out vitamin deficiencies Past medication trials:  Status of problem: Improving Interventions: -- Obtain vitamin D, B12, folate, iron panel -- Remeron as above -- Referral to family medicine  Patient was given contact information for behavioral health clinic and was instructed to call 911 for emergencies.   Subjective:  Chief Complaint:  Chief Complaint  Patient presents with   Anxiety   Depression   Follow-up   Stress    Interval History: Missed her appointment due to writing wrong date for the last one. Things have been crazy with babies on vacation and everyone needing a babysitter. 4 of them are there now, ages 76-10. Before this, had run out of medication which was difficult with racing thoughts and mood lability. Settling back down now with return of medication. Got over pneumonia but then got viral infection and starting to get over it. Sleep has been on and off and taking a 2hr nap during the day during the sick period. Having racing thoughts when going to lay down at night with trying to stay on top on of things. Reviewed titration of remeron to assist. Still without transportation and having to get rides to work right now due to financial difficulties. She will try to get bloodwork when she can. Still trying hard to quit smoking and open to replacement therapy; down to a few cigarettes per day. Also has recognition of worsening depression in the fall and winter. Still no SI.  Takes aspirin 325mg  daily prn and may take goody powder as needed.  Visit Diagnosis:    ICD-10-CM   1. GAD (generalized anxiety disorder)  F41.1 busPIRone (BUSPAR) 15 MG tablet     gabapentin (NEURONTIN) 400 MG capsule    mirtazapine (REMERON) 45 MG tablet    2. History of bipolar disorder  Z86.59 divalproex (DEPAKOTE ER) 500 MG 24 hr tablet    3. Psychophysiologic insomnia  F51.04 gabapentin (NEURONTIN) 400 MG capsule    mirtazapine (REMERON) 45 MG tablet    4. Major depressive disorder with seasonal component  F33.8 mirtazapine (REMERON) 45 MG tablet    5. Tobacco dependence  F17.200     6. High risk medication use: Depakote  Z79.899     7. 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, tobacco use disorder with COPD, arthritis of hips and knees, historical diagnosis of bipolar disorder Medication trials: depakote ER (effective), wellbutrin (effective but not for smoking cessation), buspar (effective), ambien (effective), gabapentin (effective), klonopin (ineffective), Prozac (did not remember), Paxil (did not remember), Celexa (did not remember), Remeron (effective) Previous psychiatrist/therapist: intermittently throughout the years Hospitalizations: none Suicide attempts: none SIB: none Hx of violence towards others: none Current access to guns: sister has several guns secured in a gunsafe when not in use Hx of trauma/abuse: none Substance use: None  Past Medical History:  Past Medical History:  Diagnosis Date   Bipolar disorder (HCC)  Emphysema lung (HCC)    Emphysema of lung (HCC)    Long-term current use of Ambien 01/08/2023   Pneumonia     Past Surgical History:  Procedure Laterality Date   APPENDECTOMY     FINGER AMPUTATION Left     Family Psychiatric History: None  Family History:  Family History  Problem Relation Age of Onset   Heart disease Mother    Hyperlipidemia Father     Social History:  Academic/Vocational: works at [employer]   Social History   Socioeconomic History   Marital  status: Single    Spouse name: Not on file   Number of children: Not on file   Years of education: Not on file   Highest education level: Not on file  Occupational History   Not on file  Tobacco Use   Smoking status: Every Day    Current packs/day: 0.25    Types: Cigarettes   Smokeless tobacco: Never   Tobacco comments:    Smoking about 2 packs/week.  Open to nicotine replacement on 02/05/2023  Vaping Use   Vaping status: Never Used  Substance and Sexual Activity   Alcohol use: Yes    Comment: 1-2 margaritas on special occasions   Drug use: Never   Sexual activity: Yes    Birth control/protection: Post-menopausal  Other Topics Concern   Not on file  Social History Narrative   Not on file   Social Drivers of Health   Financial Resource Strain: Not on file  Food Insecurity: Not on file  Transportation Needs: Not on file  Physical Activity: Not on file  Stress: Not on file  Social Connections: Not on file    Allergies: No Known Allergies  Current Medications: Current Outpatient Medications  Medication Sig Dispense Refill   albuterol (PROVENTIL HFA;VENTOLIN HFA) 108 (90 BASE) MCG/ACT inhaler Inhale 2 puffs into the lungs every 6 (six) hours as needed for wheezing or shortness of breath. 1 Inhaler 2   albuterol (PROVENTIL) (2.5 MG/3ML) 0.083% nebulizer solution Take 3 mLs (2.5 mg total) by nebulization every 6 (six) hours as needed for wheezing or shortness of breath. 75 mL 12   aspirin EC 325 MG tablet Take 325 mg by mouth daily as needed for mild pain (pain score 1-3) or moderate pain (pain score 4-6).     busPIRone (BUSPAR) 15 MG tablet Take 1 tablet (15 mg total) by mouth 3 (three) times daily. 90 tablet 2   divalproex (DEPAKOTE ER) 500 MG 24 hr tablet Take 2 tablets (1,000 mg total) by mouth at bedtime. 60 tablet 2   gabapentin (NEURONTIN) 400 MG capsule Take 1 capsule (400 mg total) by mouth 3 (three) times daily. 90 capsule 2   mirtazapine (REMERON) 45 MG tablet Take  1 tablet (45 mg total) by mouth at bedtime. 30 tablet 2   No current facility-administered medications for this visit.    ROS: Review of Systems  Constitutional:  Positive for appetite change. Negative for unexpected weight change.  Respiratory:  Positive for cough.   Gastrointestinal:  Negative for constipation, diarrhea, nausea and vomiting.  Endocrine: Positive for heat intolerance. Negative for cold intolerance and polyphagia.  Genitourinary:        Menopause with hot flashes  Musculoskeletal:  Positive for arthralgias and back pain.  Neurological:  Negative for dizziness and headaches.  Psychiatric/Behavioral:  Positive for dysphoric mood and sleep disturbance. Negative for decreased concentration, hallucinations, self-injury and suicidal ideas. The patient is nervous/anxious. The patient is not hyperactive.  Objective:  Psychiatric Specialty Exam: Last menstrual period 05/27/2011.There is no height or weight on file to calculate BMI.  General Appearance: Casual, Fairly Groomed, and wearing glasses.  Appears stated age  Eye Contact:  Good  Speech:  Clear and Coherent and faster rate than baseline  Volume:  Normal  Mood:   "I have been up and down all over the place"  Affect:  Appropriate, Congruent, Full Range, and depressed but able to laugh.  More anxious  Thought Content: Logical and Hallucinations: None   Suicidal Thoughts:  No  Homicidal Thoughts:  No  Thought Process:  Coherent, Goal Directed, and Linear  Orientation:  Full (Time, Place, and Person)    Memory:  Grossly intact   Judgment:  Fair  Insight:  Fair  Concentration:  Concentration: Fair  Recall:  not formally assessed   Fund of Knowledge: Good  Language: Good  Psychomotor Activity:  Normal  Akathisia:  No  AIMS (if indicated): Not done  Assets:  Communication Skills Desire for Improvement Financial Resources/Insurance Housing Leisure Time Resilience Social  Support Talents/Skills Transportation Vocational/Educational  ADL's:  Intact  Cognition: WNL  Sleep:  Poor   PE: General: sits comfortably in view of camera; no acute distress  Pulm: no increased work of breathing on room air  MSK: all extremity movements appear intact  Neuro: no focal neurological deficits observed  Gait & Station: unable to assess by video    Metabolic Disorder Labs: Lab Results  Component Value Date   HGBA1C 5.3 02/06/2018   MPG 105 02/06/2018   MPG 117 (H) 05/10/2015   No results found for: "PROLACTIN" No results found for: "CHOL", "TRIG", "HDL", "CHOLHDL", "VLDL", "LDLCALC" Lab Results  Component Value Date   TSH 0.274 (L) 05/10/2015   TSH 0.941 10/14/2012    Therapeutic Level Labs: No results found for: "LITHIUM" No results found for: "VALPROATE" No results found for: "CBMZ"  Screenings:  GAD-7    Flowsheet Row Video Visit from 08/29/2021 in [facility] Video Visit from 05/27/2021 in [facility] Video Visit from 02/25/2021 in [facility] Video Visit from 11/26/2020 in [facility]  Total GAD-7 Score 2 0 3 10      PHQ2-9    Flowsheet Row Office Visit from 01/08/2023 in [facility] [place] Video Visit from 08/29/2021 in [facility] Video Visit from 05/27/2021 in [facility] Video Visit from 02/25/2021 in [facility] Video Visit from 11/26/2020 in [facility]  PHQ-2 Total Score 0 0 2 0 2  PHQ-9 Total Score 5 -- 2 -- 11      Flowsheet Row Office Visit from 01/08/2023 in [facility] [place] Video Visit from 08/29/2021 in [facility] Video Visit from 05/27/2021 in [facility]  C-SSRS RISK CATEGORY No Risk No Risk No  Risk       Collaboration of Care: Collaboration of Care: Medication Management AEB as above and Primary Care Provider AEB as above  Patient/Guardian was advised Release of Information must be obtained prior to any record release in order to collaborate their care with an outside provider. Patient/Guardian was advised if they have not already done so to contact the registration department to sign all necessary forms in order for [place] to release information regarding their care.   Consent: Patient/Guardian gives  verbal consent for treatment and assignment of benefits for services provided during this visit. Patient/Guardian expressed understanding and agreed to proceed.   Televisit via video: I connected with patient on 05/20/23 at 11:00 AM EST by a video enabled telemedicine application and verified that I am speaking with the correct person using two identifiers.  Location: Patient: Home in [place] Provider: remote office in [place]   I discussed the limitations of evaluation and management by telemedicine and the availability of in person appointments. The patient expressed understanding and agreed to proceed.  I discussed the assessment and treatment plan with the patient. The patient was provided an opportunity to ask questions and all were answered. The patient agreed with the plan and demonstrated an understanding of the instructions.   The patient was advised to call back or seek an in-person evaluation if the symptoms worsen or if the condition fails to improve as anticipated.  I provided 20 minutes dedicated to the care of this patient via video on the date of this encounter to include chart review, face-to-face time with the patient, medication management/counseling, documentation, coordination of care with primary care provider.  Elsie Lincoln, MD 05/20/2023, 12:09 PM

## 2023-05-21 ENCOUNTER — Other Ambulatory Visit (HOSPITAL_BASED_OUTPATIENT_CLINIC_OR_DEPARTMENT_OTHER): Payer: Self-pay

## 2023-05-24 ENCOUNTER — Other Ambulatory Visit (HOSPITAL_BASED_OUTPATIENT_CLINIC_OR_DEPARTMENT_OTHER): Payer: Self-pay

## 2023-05-24 ENCOUNTER — Other Ambulatory Visit: Payer: Self-pay

## 2023-07-08 ENCOUNTER — Other Ambulatory Visit (HOSPITAL_BASED_OUTPATIENT_CLINIC_OR_DEPARTMENT_OTHER): Payer: Self-pay

## 2023-07-09 ENCOUNTER — Other Ambulatory Visit (HOSPITAL_BASED_OUTPATIENT_CLINIC_OR_DEPARTMENT_OTHER): Payer: Self-pay

## 2023-08-16 ENCOUNTER — Telehealth (INDEPENDENT_AMBULATORY_CARE_PROVIDER_SITE_OTHER): Payer: BC Managed Care – PPO | Admitting: Psychiatry

## 2023-08-16 ENCOUNTER — Other Ambulatory Visit (HOSPITAL_BASED_OUTPATIENT_CLINIC_OR_DEPARTMENT_OTHER): Payer: Self-pay

## 2023-08-16 ENCOUNTER — Encounter (HOSPITAL_COMMUNITY): Payer: Self-pay | Admitting: Psychiatry

## 2023-08-16 DIAGNOSIS — F338 Other recurrent depressive disorders: Secondary | ICD-10-CM

## 2023-08-16 DIAGNOSIS — F5104 Psychophysiologic insomnia: Secondary | ICD-10-CM

## 2023-08-16 DIAGNOSIS — F17210 Nicotine dependence, cigarettes, uncomplicated: Secondary | ICD-10-CM | POA: Diagnosis not present

## 2023-08-16 DIAGNOSIS — F17200 Nicotine dependence, unspecified, uncomplicated: Secondary | ICD-10-CM

## 2023-08-16 DIAGNOSIS — F411 Generalized anxiety disorder: Secondary | ICD-10-CM | POA: Diagnosis not present

## 2023-08-16 DIAGNOSIS — Z79899 Other long term (current) drug therapy: Secondary | ICD-10-CM

## 2023-08-16 DIAGNOSIS — E559 Vitamin D deficiency, unspecified: Secondary | ICD-10-CM

## 2023-08-16 DIAGNOSIS — Z758 Other problems related to medical facilities and other health care: Secondary | ICD-10-CM

## 2023-08-16 MED ORDER — MIRTAZAPINE 15 MG PO TABS
15.0000 mg | ORAL_TABLET | Freq: Every day | ORAL | 2 refills | Status: DC
Start: 2023-08-16 — End: 2023-09-13
  Filled 2023-08-16: qty 30, 30d supply, fill #0

## 2023-08-16 NOTE — Progress Notes (Signed)
 [facility] MD Outpatient Progress Note  08/16/2023 11:16 AM Holly Cortez  MRN:  [id number]  Assessment:  Holly Cortez presents for follow-up evaluation. Today, 08/16/23, patient reports ongoing multiple life stressors principal of which is having less work available with increased financial constraints.  Due to caregiver responsibilities for her ailing mother she is still not been able to get necessary blood work for Depakote monitoring but she did self taper Depakote after last appointment discussion around need for monitoring.  Unfortunately could not tolerate the titration of Remeron to 45 mg with akathisia but did find that the 15 mg dose prior to the 30 mg dose was effective without sleeping over so we will resume that today.  Meals are still roughly 2/day and will need to continue to monitor.  Also still does not have a primary care provider and has not established with one after getting her referral.   Previously had worsening hot flashes every time she does not take Wellbutrin but if wanting to consider switching to Effexor in the future would likely try to come off of BuSpar first.  She was also open to smoking cessation and previously provided quitline number but she has not utilized this yet and due to the stress above as significantly increased cigarette consumption. Not interested in psychotherapy.  Follow-up in 1 month.  For safety, her acute risk factors for suicide are: Generalized anxiety disorder.  Her chronic risk factors for suicide are: Chronic mental illness, chronic pain, insomnia, access to firearms.  Her protective factors are: Employment, firearms are secured in a gun safe when not in use, no suicidal ideation, actively seeking and engaging with mental health care, medication compliance.  While future events cannot be fully predicted she does not currently meet IVC criteria and can be continued as an outpatient.  Identifying Information: Holly Cortez is a 63 y.o. 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 Initial evaluation of medication refill for insomnia and anxiety on 01/08/2023; please see that note for full case formulation.  Gabapentin carries its own risk of memory impairment later in life still need to continue to have a cost benefit analysis with ongoing use.  Does appear partially beneficial for chronic osteoarthritic pain which the Depakote could be assisting with as well.  May look to swap out Wellbutrin/BuSpar with Effexor or Cymbalta given that she also had hot flashes from menopause.  Improvement in appetite could also be coming from running out of Wellbutrin and we will plan on not returning to that medication for now as it was also not helping with smoking cessation.   Plan:  # Generalized anxiety disorder Past medication trials: See medication trials below Status of problem: Chronic with moderate exacerbation Interventions: -- Continue BuSpar 15 mg 3 times daily for now --Continue gabapentin 400 mg 2 times daily for now --restart Remeron at 15 mg nightly (s10/10/24, i12/26/24, rs3/24/25) -- Obtain TSH, total T3, free T4  # Psychologic insomnia with prior long-term use of Ambien Past medication trials:  Status of problem: Chronic with mild exacerbation Interventions: -- Remeron as above -- Continue Depakote ER 500 mg nightly for now -- CBT-I  # Historical diagnosis of bipolar disorder rule out major depressive disorder in remission Past medication trials:  Status of problem: In partial remission Interventions: -- Depakote as above  #  Tobacco use disorder with COPD  patient does not have primary care provider Past medication trials:  Status of problem: Chronic with moderate exacerbation Interventions: --  Continue to encourage abstinence and provided quit Line --Smoking cessation counseling provided --Avoid respiratory depressants as possible -- Referral to family medicine  # High risk medication use: Depakote Past medication trials:  Status of problem: Chronic and stable Interventions: -- Ordered valproic acid level, CBC, CMP  # Chronic osteoarthritic pain Past medication trials:  Status of problem: Chronic and stable Interventions: -- Depakote, gabapentin as above  # Eating now around 2 meals per day rule out vitamin deficiencies Past medication trials:  Status of problem: Chronic and stable Interventions: -- Obtain vitamin D, B12, folate, iron panel -- Remeron as above -- Referral to family medicine  Patient was given contact information for behavioral health clinic and was instructed to call 911 for emergencies.   Subjective:  Chief Complaint:  Chief Complaint  Patient presents with   Anxiety   Follow-up   Stress    Interval History: Hasn't been doing well at all, the remeron unfortunately led to akathisia so stopped taking it. Sleep has therefore continued to be poor. Her mother has been sick so that has been helpful to put her focus in. Still uncertain of what the illness she is struggling with. Has been able to work 3 days per week and has used up all her vacation time at this point. Still without transportation and having to get rides to work right now due to financial difficulties. Sleep is still typified as cat naps due to vivid dreams; 3hrs over the course of the day. Shift is still 3p-2a so appetite comes later in the day and has been trying to eat a small snack before going in, regular sized lunch, and dinner is leftovers. The racing thoughts are still ongoing and related to the above; still primarily worst when going to lay down at night with trying to stay on top on of things. She will try to get bloodwork when she can. Had self decreased depakote to 500mg  nightly.  Still trying hard to quit smoking and has worsened due to stress as above up to 0.5ppd; wants to quit and is open to replacement therapy. Still no SI.  Takes aspirin 325mg  daily prn and may take goody powder as needed.  Visit Diagnosis:    ICD-10-CM   1. GAD (generalized anxiety disorder)  F41.1 mirtazapine (REMERON) 15 MG tablet    2. Psychophysiologic insomnia  F51.04 mirtazapine (REMERON) 15 MG tablet    3. Major depressive disorder with seasonal component  F33.8 mirtazapine (REMERON) 15 MG tablet    4. Does not have primary care provider  Z75.8     5. Rule out Vitamin D deficiency  E55.9     6. Tobacco dependence  F17.200     7. 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, tobacco use disorder with COPD, arthritis of hips and knees, historical diagnosis of bipolar disorder Medication trials: depakote ER (effective), wellbutrin (effective but not for smoking cessation), buspar (effective), ambien (effective), gabapentin (effective), klonopin (ineffective), Prozac (did not remember), trazodone (ineffective), Paxil (did not remember), Celexa (did not remember), Remeron (effective but akathisia at 45mg ) Previous psychiatrist/therapist: intermittently throughout the years Hospitalizations: none Suicide attempts: none SIB: none Hx of violence towards others: none Current access to  guns: sister has several guns secured in a gunsafe when not in use Hx of trauma/abuse: none Substance use: None  Past Medical History:  Past Medical History:  Diagnosis Date   Bipolar disorder (HCC)    Emphysema lung (HCC)    Emphysema of lung (HCC)    Long-term current use of Ambien 01/08/2023   Pneumonia     Past Surgical History:  Procedure Laterality Date   APPENDECTOMY     FINGER AMPUTATION Left     Family Psychiatric History: None  Family History:   Family History  Problem Relation Age of Onset   Heart disease Mother    Hyperlipidemia Father     Social History:  Academic/Vocational: works at [employer]   Social History   Socioeconomic History   Marital status: Single    Spouse name: Not on file   Number of children: Not on file   Years of education: Not on file   Highest education level: Not on file  Occupational History   Not on file  Tobacco Use   Smoking status: Every Day    Current packs/day: 0.25    Types: Cigarettes   Smokeless tobacco: Never   Tobacco comments:    Half a pack per day as of 08/16/2023  Vaping Use   Vaping status: Never Used  Substance and Sexual Activity   Alcohol use: Yes    Comment: 1-2 margaritas on special occasions   Drug use: Never   Sexual activity: Yes    Birth control/protection: Post-menopausal  Other Topics Concern   Not on file  Social History Narrative   Not on file   Social Drivers of Health   Financial Resource Strain: Not on file  Food Insecurity: Not on file  Transportation Needs: Not on file  Physical Activity: Not on file  Stress: Not on file  Social Connections: Not on file    Allergies: No Known Allergies  Current Medications: Current Outpatient Medications  Medication Sig Dispense Refill   albuterol (PROVENTIL HFA;VENTOLIN HFA) 108 (90 BASE) MCG/ACT inhaler Inhale 2 puffs into the lungs every 6 (six) hours as needed for wheezing or shortness of breath. 1 Inhaler 2   albuterol (PROVENTIL) (2.5 MG/3ML) 0.083% nebulizer solution Take 3 mLs (2.5 mg total) by nebulization every 6 (six) hours as needed for wheezing or shortness of breath. 75 mL 12   aspirin EC 325 MG tablet Take 325 mg by mouth daily as needed for mild pain (pain score 1-3) or moderate pain (pain score 4-6).     busPIRone (BUSPAR) 15 MG tablet Take 1 tablet (15 mg total) by mouth 3 (three) times daily. 90 tablet 2   divalproex (DEPAKOTE ER) 500 MG 24 hr tablet Take 2 tablets (1,000 mg total) by mouth  at bedtime. 60 tablet 2   gabapentin (NEURONTIN) 400 MG capsule Take 1 capsule (400 mg total) by mouth 3 (three) times daily. 90 capsule 2   mirtazapine (REMERON) 15 MG tablet Take 1 tablet (15 mg total) by mouth at bedtime. 30 tablet 2   No current facility-administered medications for this visit.    ROS: Review of Systems  Constitutional:  Positive for appetite change. Negative for unexpected weight change.  Gastrointestinal:  Negative for constipation, diarrhea, nausea and vomiting.  Endocrine: Positive for heat intolerance. Negative for cold intolerance and polyphagia.  Genitourinary:        Menopause with hot flashes  Musculoskeletal:  Positive for arthralgias and back pain.  Neurological:  Negative for dizziness and  headaches.  Psychiatric/Behavioral:  Positive for dysphoric mood and sleep disturbance. Negative for decreased concentration, hallucinations, self-injury and suicidal ideas. The patient is nervous/anxious. The patient is not hyperactive.     Objective:  Psychiatric Specialty Exam: Last menstrual period 05/27/2011.There is no height or weight on file to calculate BMI.  General Appearance: Casual, Fairly Groomed, and wearing glasses.  Appears stated age  Eye Contact:  Good  Speech:  Clear and Coherent and faster rate than baseline  Volume:  Normal  Mood:   "I have been having a lot more trouble sleeping"  Affect:  Appropriate, Congruent, Full Range, and depressed but able to laugh.  More anxious  Thought Content: Logical and Hallucinations: None   Suicidal Thoughts:  No  Homicidal Thoughts:  No  Thought Process:  Coherent, Goal Directed, and Linear  Orientation:  Full (Time, Place, and Person)    Memory:  Grossly intact   Judgment:  Fair  Insight:  Fair  Concentration:  Concentration: Fair  Recall:  not formally assessed   Fund of Knowledge: Good  Language: Good  Psychomotor Activity:  Increased and Restlessness  Akathisia:  No  AIMS (if indicated): Not  done  Assets:  Communication Skills Desire for Improvement Financial Resources/Insurance Housing Leisure Time Resilience Social Support Talents/Skills Transportation Vocational/Educational  ADL's:  Intact  Cognition: WNL  Sleep:  Poor   PE: General: sits comfortably in view of camera; no acute distress  Pulm: no increased work of breathing on room air  MSK: all extremity movements appear intact  Neuro: no focal neurological deficits observed  Gait & Station: unable to assess by video    Metabolic Disorder Labs: Lab Results  Component Value Date   HGBA1C 5.3 02/06/2018   MPG 105 02/06/2018   MPG 117 (H) 05/10/2015   No results found for: "PROLACTIN" No results found for: "CHOL", "TRIG", "HDL", "CHOLHDL", "VLDL", "LDLCALC" Lab Results  Component Value Date   TSH 0.274 (L) 05/10/2015   TSH 0.941 10/14/2012    Therapeutic Level Labs: No results found for: "LITHIUM" No results found for: "VALPROATE" No results found for: "CBMZ"  Screenings:  GAD-7    Flowsheet Row Video Visit from 08/29/2021 in [facility] Video Visit from 05/27/2021 in [facility] Video Visit from 02/25/2021 in [facility] Video Visit from 11/26/2020 in [facility]  Total GAD-7 Score 2 0 3 10      PHQ2-9    Flowsheet Row Office Visit from 01/08/2023 in [facility] [place] Video Visit from 08/29/2021 in [facility] Video Visit from 05/27/2021 in [facility] Video Visit from 02/25/2021 in [facility] Video Visit from 11/26/2020 in [facility]  PHQ-2 Total Score 0 0 2 0 2  PHQ-9 Total Score 5 -- 2 -- 11      Flowsheet Row Office Visit from 01/08/2023 in [facility] [place] Video Visit from 08/29/2021 in [facility] Video Visit from 05/27/2021 in [facility]  C-SSRS RISK CATEGORY No Risk No Risk No Risk       Collaboration of Care: Collaboration of Care: Medication Management AEB as above and Primary Care Provider AEB as above  Patient/Guardian was advised Release of Information must be obtained prior to any record release in order to collaborate their care with an outside provider. Patient/Guardian  was advised if they have not already done so to contact the registration department to sign all necessary forms in order for [place] to release information regarding their care.   Consent: Patient/Guardian gives verbal consent for treatment and assignment of benefits for services provided during this visit. Patient/Guardian expressed understanding and agreed to proceed.   Televisit via video: I connected with patient on 08/16/23 at 11:00 AM EDT by a video enabled telemedicine application and verified that I am speaking with the correct person using two identifiers.  Location: Patient: Home in [place] Provider: remote office in [place]   I discussed the limitations of evaluation and management by telemedicine and the availability of in person appointments. The patient expressed understanding and agreed to proceed.  I discussed the assessment and treatment plan with the patient. The patient was provided an opportunity to ask questions and all were answered. The patient agreed with the plan and demonstrated an understanding of the instructions.   The patient was advised to call back or seek an in-person evaluation if the symptoms worsen or if the condition fails to improve as anticipated.  I provided 25 minutes dedicated to the care of this patient via video on the date of this encounter to include chart review, face-to-face time with the patient, medication management/counseling, documentation, coordination of care with primary care provider.  Elsie Lincoln,  MD 08/16/2023, 11:16 AM

## 2023-08-16 NOTE — Patient Instructions (Signed)
 We restarted Remeron (mirtazapine) at 15 mg nightly today.  As soon as you can, please come by the lab to get up to blood work as previously discussed.  Also when you get a chance, please establish care with primary care as the referral has been placed.

## 2023-09-13 ENCOUNTER — Telehealth (INDEPENDENT_AMBULATORY_CARE_PROVIDER_SITE_OTHER): Admitting: Psychiatry

## 2023-09-13 ENCOUNTER — Encounter (HOSPITAL_COMMUNITY): Payer: Self-pay | Admitting: Psychiatry

## 2023-09-13 DIAGNOSIS — F411 Generalized anxiety disorder: Secondary | ICD-10-CM | POA: Diagnosis not present

## 2023-09-13 DIAGNOSIS — Z758 Other problems related to medical facilities and other health care: Secondary | ICD-10-CM | POA: Diagnosis not present

## 2023-09-13 DIAGNOSIS — F17210 Nicotine dependence, cigarettes, uncomplicated: Secondary | ICD-10-CM

## 2023-09-13 DIAGNOSIS — F338 Other recurrent depressive disorders: Secondary | ICD-10-CM | POA: Diagnosis not present

## 2023-09-13 DIAGNOSIS — F17200 Nicotine dependence, unspecified, uncomplicated: Secondary | ICD-10-CM

## 2023-09-13 DIAGNOSIS — F5104 Psychophysiologic insomnia: Secondary | ICD-10-CM | POA: Diagnosis not present

## 2023-09-13 MED ORDER — GABAPENTIN 400 MG PO CAPS: 400.0000 mg | ORAL_CAPSULE | Freq: Two times a day (BID) | ORAL | 2 refills | Status: DC

## 2023-09-13 MED ORDER — MIRTAZAPINE 7.5 MG PO TABS: 7.5000 mg | ORAL_TABLET | Freq: Every day | ORAL | 2 refills | Status: DC

## 2023-09-13 MED ORDER — BUSPIRONE HCL 15 MG PO TABS
15.0000 mg | ORAL_TABLET | Freq: Three times a day (TID) | ORAL | 2 refills | Status: AC
Start: 2023-09-13 — End: ?

## 2023-09-13 NOTE — Progress Notes (Signed)
 [facility] MD Outpatient Progress Note  09/13/2023 9:40 AM Holly Cortez  MRN:  [id number]  Assessment:  Holly Cortez presents for follow-up evaluation. Today, 09/13/23, patient reports improvement to anxiety and insomnia with decreased dose of Remeron  but unfortunately still having restless legs prior to being able to fall asleep.  We will therefore continue taper of dose to 7.5 mg and she can cut this in half.  She self-discontinued Depakote  as she has not been able to get the blood work and insurance was not covering this medication in an affordable way.  Despite minimum gabapentin  use she had no symptoms of mania while on BuSpar  and Remeron  which has before points away from a bipolar spectrum of illness diagnosis.  Due to caregiver responsibilities for her ailing mother she is still not been able to get blood work and encouraged her to establish with primary care as they would be able to manage abnormal results moving forward. Meals are still roughly 2/day and will need to continue to monitor.  Still having worsening hot flashes which was previously only attributable to every time she did not take Wellbutrin  but if wanting to consider switching to Effexor in the future would likely try to come off of BuSpar  first.  She was also open to smoking cessation and previously provided quitline number but she has not utilized this yet. Not interested in psychotherapy.  Follow-up in 1 month.  For safety, her acute risk factors for suicide are: Generalized anxiety disorder.  Her chronic risk factors for suicide are: Chronic mental illness, chronic pain, insomnia, access to firearms.  Her protective factors are: Employment, firearms are secured in a gun safe when not in use, no suicidal ideation, actively seeking and engaging with mental health care, medication compliance.  While future events cannot be fully predicted she does not currently meet IVC criteria and can be continued as an outpatient.  Identifying  Information: Holly Cortez is a 63 y.o. 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 Initial evaluation of medication refill for insomnia and anxiety on 01/08/2023; please see that note for full case formulation.  Gabapentin  carries its own risk of memory impairment later in life still need to continue to have a cost benefit analysis with ongoing use.  Does appear partially beneficial for chronic osteoarthritic pain which the Depakote  could be assisting with as well.  May look to swap out Wellbutrin /BuSpar  with Effexor or Cymbalta given that she also had hot flashes from menopause.  Improvement in appetite could also be coming from running out of Wellbutrin  and we will plan on not returning to that medication for now as it was also not helping with smoking cessation. Unfortunately could not tolerate the titration of Remeron  to 45 mg with akathisia but did find that the 15 mg dose prior to the 30 mg dose was effective without sleep hangover.   Plan:  # Generalized anxiety disorder Past medication trials: See medication trials below Status of problem: improving Interventions: -- Continue BuSpar  15 mg 3 times daily for now --Continue gabapentin  400 mg 2 times daily for now --decrease Remeron  to 7.5 mg nightly (s10/10/24, i12/26/24, rs3/24/25, d4/21/25) -- Obtain TSH, total T3, free T4  # Psychologic insomnia with prior long-term use of Ambien  Past medication trials:  Status of problem: improving Interventions: -- Remeron  as above --  CBT-I  # Historical diagnosis of bipolar disorder rule out major depressive disorder in remission Past medication trials:  Status of problem: In partial remission Interventions: -- continue to monitor  # Tobacco use disorder  with COPD  patient does not have primary care provider Past medication trials:  Status of problem: improving Interventions: -- Continue to encourage abstinence and provided quit Line --Smoking cessation counseling provided --Avoid respiratory depressants as possible -- Referral to family medicine  # Chronic osteoarthritic pain Past medication trials:  Status of problem: Chronic and stable Interventions: -- gabapentin  as above  # Eating now around 2 meals per day rule out vitamin deficiencies Past medication trials:  Status of problem: Chronic and stable Interventions: -- Obtain vitamin D , B12, folate, iron panel -- Remeron  as above -- Referral to family medicine  Patient was given contact information for behavioral health clinic and was instructed to call 911 for emergencies.   Subjective:  Chief Complaint:  Chief Complaint  Patient presents with   Anxiety   Depression   Follow-up   Stress    Interval History: Things have been good since last appointment. The akathisia resolved and sleep improved to 5-6hrs but even 7.5mg  of remeron  leads to restless legs. Meals/appetite have improved with eating throughout the day with snacks and full meals will be on the weekends when going out to eat. Pollen main physical effect right now. Shift is still 3p-2a. The racing thoughts have improved. Did completely stop the depakote  after insurance changed wasn't able to afford it so stopped taking it. She will try to get bloodwork when she can for vitamin levels. Still trying hard to quit smoking and now less than 0.5ppd; wants to quit and is open to replacement therapy. Still no SI.  Takes aspirin 325mg  daily prn and may take goody powder as needed.  Visit Diagnosis:    ICD-10-CM   1. GAD (generalized anxiety disorder)  F41.1 busPIRone  (BUSPAR ) 15 MG tablet    gabapentin  (NEURONTIN ) 400 MG capsule    mirtazapine  (REMERON ) 7.5 MG tablet    2. Psychophysiologic insomnia  F51.04 gabapentin   (NEURONTIN ) 400 MG capsule    mirtazapine  (REMERON ) 7.5 MG tablet    3. Major depressive disorder with seasonal component  F33.8 mirtazapine  (REMERON ) 7.5 MG tablet    4. Does not have primary care provider  Z75.8     5. 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 , tobacco use disorder with COPD, arthritis of hips and knees, historical diagnosis of bipolar disorder Medication trials: depakote  ER (effective), wellbutrin  (effective but not for smoking cessation), buspar  (effective), ambien  (effective), gabapentin  (effective), klonopin (ineffective), Prozac (did not remember), trazodone (ineffective), Paxil  (did not remember), Celexa (did not remember), Remeron  (effective but akathisia at 45mg  and 7.5mg ) Previous psychiatrist/therapist: intermittently throughout the years Hospitalizations: none Suicide attempts: none SIB: none Hx of violence towards others: none Current access to guns: sister has several guns secured in a gunsafe when not in use Hx of trauma/abuse: none Substance use: None  Past Medical History:  Past Medical History:  Diagnosis Date   Bipolar disorder (HCC)    Emphysema lung (HCC)    Emphysema of lung (HCC)    High risk medication use: Depakote  01/08/2023   Long-term current use of Ambien  01/08/2023   Pneumonia     Past Surgical History:  Procedure Laterality Date   APPENDECTOMY     FINGER  AMPUTATION Left     Family Psychiatric History: None  Family History:  Family History  Problem Relation Age of Onset   Heart disease Mother    Hyperlipidemia Father     Social History:  Academic/Vocational: works at [employer]   Social History   Socioeconomic History   Marital status: Single    Spouse name: Not on file   Number of children: Not on file   Years of education: Not on file   Highest education level: Not on file  Occupational History    Not on file  Tobacco Use   Smoking status: Every Day    Current packs/day: 0.25    Types: Cigarettes   Smokeless tobacco: Never   Tobacco comments:    Less than half a pack per day as of 09/13/2023  Vaping Use   Vaping status: Never Used  Substance and Sexual Activity   Alcohol use: Yes    Comment: 1-2 margaritas on special occasions   Drug use: Never   Sexual activity: Yes    Birth control/protection: Post-menopausal  Other Topics Concern   Not on file  Social History Narrative   Not on file   Social Drivers of Health   Financial Resource Strain: Not on file  Food Insecurity: Not on file  Transportation Needs: Not on file  Physical Activity: Not on file  Stress: Not on file  Social Connections: Not on file    Allergies: No Known Allergies  Current Medications: Current Outpatient Medications  Medication Sig Dispense Refill   albuterol  (PROVENTIL  HFA;VENTOLIN  HFA) 108 (90 BASE) MCG/ACT inhaler Inhale 2 puffs into the lungs every 6 (six) hours as needed for wheezing or shortness of breath. 1 Inhaler 2   albuterol  (PROVENTIL ) (2.5 MG/3ML) 0.083% nebulizer solution Take 3 mLs (2.5 mg total) by nebulization every 6 (six) hours as needed for wheezing or shortness of breath. 75 mL 12   aspirin EC 325 MG tablet Take 325 mg by mouth daily as needed for mild pain (pain score 1-3) or moderate pain (pain score 4-6).     busPIRone  (BUSPAR ) 15 MG tablet Take 1 tablet (15 mg total) by mouth 3 (three) times daily. 90 tablet 2   gabapentin  (NEURONTIN ) 400 MG capsule Take 1 capsule (400 mg total) by mouth 2 (two) times daily. 60 capsule 2   mirtazapine  (REMERON ) 7.5 MG tablet Take 1 tablet (7.5 mg total) by mouth at bedtime. 30 tablet 2   No current facility-administered medications for this visit.    ROS: Review of Systems  Constitutional:  Positive for appetite change. Negative for unexpected weight change.  Gastrointestinal:  Negative for constipation, diarrhea, nausea and  vomiting.  Endocrine: Positive for heat intolerance. Negative for cold intolerance and polyphagia.  Genitourinary:        Menopause with hot flashes  Musculoskeletal:  Positive for arthralgias and back pain.  Neurological:  Negative for dizziness and headaches.  Psychiatric/Behavioral:  Positive for sleep disturbance. Negative for decreased concentration, dysphoric mood, hallucinations, self-injury and suicidal ideas. The patient is nervous/anxious. The patient is not hyperactive.     Objective:  Psychiatric Specialty Exam: Last menstrual period 05/27/2011.There is no height or weight on file to calculate BMI.  General Appearance: Casual, Fairly Groomed, and wearing glasses.  Appears stated age  Eye Contact:  Good  Speech:  Clear and Coherent and faster rate than baseline  Volume:  Normal  Mood:   "Better!"  Affect:  Appropriate, Congruent, Full Range, and less depressed/anxious and able  to laugh.  Thought Content: Logical and Hallucinations: None   Suicidal Thoughts:  No  Homicidal Thoughts:  No  Thought Process:  Coherent, Goal Directed, and Linear  Orientation:  Full (Time, Place, and Person)    Memory:  Grossly intact   Judgment:  Fair  Insight:  Fair  Concentration:  Concentration: Fair  Recall:  not formally assessed   Fund of Knowledge: Good  Language: Good  Psychomotor Activity:  Increased and Restlessness but improving  Akathisia:  No  AIMS (if indicated): Not done  Assets:  Communication Skills Desire for Improvement Financial Resources/Insurance Housing Leisure Time Resilience Social Support Talents/Skills Transportation Vocational/Educational  ADL's:  Intact  Cognition: WNL  Sleep:  Poor but improving   PE: General: sits comfortably in view of camera; no acute distress  Pulm: no increased work of breathing on room air  MSK: all extremity movements appear intact  Neuro: no focal neurological deficits observed  Gait & Station: unable to assess by video     Metabolic Disorder Labs: Lab Results  Component Value Date   HGBA1C 5.3 02/06/2018   MPG 105 02/06/2018   MPG 117 (H) 05/10/2015   No results found for: "PROLACTIN" No results found for: "CHOL", "TRIG", "HDL", "CHOLHDL", "VLDL", "LDLCALC" Lab Results  Component Value Date   TSH 0.274 (L) 05/10/2015   TSH 0.941 10/14/2012    Therapeutic Level Labs: No results found for: "LITHIUM" No results found for: "VALPROATE" No results found for: "CBMZ"  Screenings:  GAD-7    Flowsheet Row Video Visit from 08/29/2021 in [facility] Video Visit from 05/27/2021 in [facility] Video Visit from 02/25/2021 in [facility] Video Visit from 11/26/2020 in [facility]  Total GAD-7 Score 2 0 3 10      PHQ2-9    Flowsheet Row Office Visit from 01/08/2023 in [facility] [place] Video Visit from 08/29/2021 in [facility] Video Visit from 05/27/2021 in [facility] Video Visit from 02/25/2021 in [facility] Video Visit from 11/26/2020 in [facility]  PHQ-2 Total Score 0 0 2 0 2  PHQ-9 Total Score 5 -- 2 -- 11      Flowsheet Row Office Visit from 01/08/2023 in [facility] [place] Video Visit from 08/29/2021 in [facility] Video Visit from 05/27/2021 in [facility]  C-SSRS RISK CATEGORY No Risk No Risk No Risk       Collaboration of Care: Collaboration of Care: Medication Management AEB as above and Primary Care Provider AEB as above  Patient/Guardian was advised Release of Information must be obtained prior to any record release in order to collaborate their care with an outside provider. Patient/Guardian was advised if they have not already done so to contact the  registration department to sign all necessary forms in order for us  to release information regarding their care.   Consent: Patient/Guardian gives verbal consent for treatment and assignment of benefits for services provided during this visit. Patient/Guardian expressed understanding and agreed to proceed.   Televisit via video: I connected with patient on 09/13/23 at  9:30 AM EDT by a video enabled telemedicine application and verified that I am speaking with the correct person using two identifiers.  Location: Patient: Home in [place] Provider: remote office in [place]   I discussed the limitations of  evaluation and management by telemedicine and the availability of in person appointments. The patient expressed understanding and agreed to proceed.  I discussed the assessment and treatment plan with the patient. The patient was provided an opportunity to ask questions and all were answered. The patient agreed with the plan and demonstrated an understanding of the instructions.   The patient was advised to call back or seek an in-person evaluation if the symptoms worsen or if the condition fails to improve as anticipated.  I provided 20 minutes dedicated to the care of this patient via video on the date of this encounter to include chart review, face-to-face time with the patient, medication management/counseling, documentation.  Madie Schilling, MD 09/13/2023, 9:40 AM

## 2023-09-13 NOTE — Patient Instructions (Addendum)
 We decreased the Remeron  to 7.5 mg tablets today.  You can try cutting this in half to see if you still get the sleep benefit but with less of the restless legs at night.  If you get your blood work drawn and you can tell them you do not need the valproic acid level at this point but we will still plan on collecting the other previously planned vitamin levels, CBC, CMP, TSH, total T3, free T4.  Here is the website for the quit Line: [email or web address]

## 2023-10-09 ENCOUNTER — Other Ambulatory Visit (HOSPITAL_COMMUNITY): Payer: Self-pay | Admitting: Psychiatry

## 2023-10-09 DIAGNOSIS — F411 Generalized anxiety disorder: Secondary | ICD-10-CM

## 2023-10-09 DIAGNOSIS — F338 Other recurrent depressive disorders: Secondary | ICD-10-CM

## 2023-10-09 DIAGNOSIS — F5104 Psychophysiologic insomnia: Secondary | ICD-10-CM

## 2023-10-12 ENCOUNTER — Encounter (HOSPITAL_COMMUNITY): Payer: Self-pay | Admitting: Psychiatry

## 2023-10-12 ENCOUNTER — Telehealth (INDEPENDENT_AMBULATORY_CARE_PROVIDER_SITE_OTHER): Admitting: Psychiatry

## 2023-10-12 DIAGNOSIS — Z758 Other problems related to medical facilities and other health care: Secondary | ICD-10-CM

## 2023-10-12 DIAGNOSIS — F17200 Nicotine dependence, unspecified, uncomplicated: Secondary | ICD-10-CM

## 2023-10-12 DIAGNOSIS — F5104 Psychophysiologic insomnia: Secondary | ICD-10-CM

## 2023-10-12 DIAGNOSIS — F411 Generalized anxiety disorder: Secondary | ICD-10-CM | POA: Diagnosis not present

## 2023-10-12 DIAGNOSIS — F338 Other recurrent depressive disorders: Secondary | ICD-10-CM

## 2023-10-12 MED ORDER — GABAPENTIN 400 MG PO CAPS: 400.0000 mg | ORAL_CAPSULE | Freq: Two times a day (BID) | ORAL | 5 refills | Status: AC
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
## 2023-10-12 NOTE — Patient Instructions (Signed)
 We did not make any medication changes today.
